# Patient Record
Sex: Male | Born: 1935 | Race: White | Hispanic: No | Smoking: Former smoker
Health system: Southern US, Community
[De-identification: ages and names within clinical notes are randomized; demographics above are authoritative.]

## PROBLEM LIST (undated history)

## (undated) ENCOUNTER — Emergency Department (HOSPITAL_COMMUNITY): Admission: EM | Payer: Medicare Other | Source: Home / Self Care

## (undated) DIAGNOSIS — C61 Malignant neoplasm of prostate: Secondary | ICD-10-CM

## (undated) DIAGNOSIS — L719 Rosacea, unspecified: Secondary | ICD-10-CM

## (undated) DIAGNOSIS — E039 Hypothyroidism, unspecified: Secondary | ICD-10-CM

## (undated) DIAGNOSIS — K219 Gastro-esophageal reflux disease without esophagitis: Secondary | ICD-10-CM

## (undated) DIAGNOSIS — N3941 Urge incontinence: Secondary | ICD-10-CM

## (undated) DIAGNOSIS — I2510 Atherosclerotic heart disease of native coronary artery without angina pectoris: Secondary | ICD-10-CM

## (undated) DIAGNOSIS — E782 Mixed hyperlipidemia: Secondary | ICD-10-CM

## (undated) DIAGNOSIS — I219 Acute myocardial infarction, unspecified: Secondary | ICD-10-CM

## (undated) DIAGNOSIS — Z860100 Personal history of colon polyps, unspecified: Secondary | ICD-10-CM

## (undated) DIAGNOSIS — M1990 Unspecified osteoarthritis, unspecified site: Secondary | ICD-10-CM

## (undated) HISTORY — DX: Gastro-esophageal reflux disease without esophagitis: K21.9

## (undated) HISTORY — DX: Hypothyroidism, unspecified: E03.9

## (undated) HISTORY — DX: Urge incontinence: N39.41

## (undated) HISTORY — DX: Personal history of colon polyps, unspecified: Z86.0100

## (undated) HISTORY — DX: Personal history of colonic polyps: Z86.010

## (undated) HISTORY — DX: Unspecified osteoarthritis, unspecified site: M19.90

## (undated) HISTORY — DX: Acute myocardial infarction, unspecified: I21.9

## (undated) HISTORY — DX: Rosacea, unspecified: L71.9

## (undated) HISTORY — PX: OTHER SURGICAL HISTORY: SHX169

## (undated) HISTORY — PX: TONSILLECTOMY: SUR1361

## (undated) HISTORY — DX: Atherosclerotic heart disease of native coronary artery without angina pectoris: I25.10

## (undated) HISTORY — DX: Mixed hyperlipidemia: E78.2

---

## 2005-12-30 ENCOUNTER — Ambulatory Visit: Payer: Self-pay | Admitting: Internal Medicine

## 2006-03-17 ENCOUNTER — Ambulatory Visit: Payer: Self-pay | Admitting: Cardiology

## 2007-04-07 ENCOUNTER — Encounter: Admission: RE | Admit: 2007-04-07 | Discharge: 2007-04-07 | Payer: Self-pay | Admitting: Neurosurgery

## 2008-05-27 ENCOUNTER — Encounter: Payer: Self-pay | Admitting: Cardiology

## 2010-03-27 ENCOUNTER — Encounter: Payer: Self-pay | Admitting: Cardiology

## 2010-04-08 ENCOUNTER — Encounter: Payer: Self-pay | Admitting: Cardiology

## 2010-09-12 ENCOUNTER — Encounter: Payer: Self-pay | Admitting: Cardiology

## 2010-09-25 ENCOUNTER — Encounter: Payer: Self-pay | Admitting: Cardiology

## 2010-10-19 ENCOUNTER — Encounter: Payer: Self-pay | Admitting: Cardiology

## 2010-10-21 ENCOUNTER — Encounter: Payer: Self-pay | Admitting: Cardiology

## 2010-10-30 ENCOUNTER — Encounter: Payer: Self-pay | Admitting: Cardiology

## 2010-11-11 ENCOUNTER — Encounter: Payer: Self-pay | Admitting: Cardiology

## 2010-11-24 ENCOUNTER — Encounter: Payer: Self-pay | Admitting: Cardiology

## 2010-11-26 ENCOUNTER — Ambulatory Visit: Payer: Self-pay | Admitting: Cardiology

## 2010-11-26 DIAGNOSIS — I200 Unstable angina: Secondary | ICD-10-CM

## 2010-11-26 DIAGNOSIS — R030 Elevated blood-pressure reading, without diagnosis of hypertension: Secondary | ICD-10-CM | POA: Insufficient documentation

## 2010-11-26 DIAGNOSIS — E782 Mixed hyperlipidemia: Secondary | ICD-10-CM | POA: Insufficient documentation

## 2010-11-26 DIAGNOSIS — R9430 Abnormal result of cardiovascular function study, unspecified: Secondary | ICD-10-CM | POA: Insufficient documentation

## 2010-11-26 HISTORY — PX: CORONARY ARTERY BYPASS GRAFT: SHX141

## 2010-11-27 ENCOUNTER — Encounter: Payer: Self-pay | Admitting: Cardiology

## 2010-11-30 ENCOUNTER — Telehealth (INDEPENDENT_AMBULATORY_CARE_PROVIDER_SITE_OTHER): Payer: Self-pay | Admitting: *Deleted

## 2010-12-04 ENCOUNTER — Inpatient Hospital Stay (HOSPITAL_COMMUNITY)
Admission: RE | Admit: 2010-12-04 | Discharge: 2010-12-11 | Payer: Self-pay | Source: Home / Self Care | Attending: Cardiothoracic Surgery | Admitting: Cardiothoracic Surgery

## 2010-12-04 ENCOUNTER — Encounter: Payer: Self-pay | Admitting: Cardiovascular Disease

## 2010-12-04 ENCOUNTER — Encounter: Payer: Self-pay | Admitting: Cardiology

## 2010-12-05 ENCOUNTER — Encounter: Payer: Self-pay | Admitting: Cardiology

## 2010-12-05 ENCOUNTER — Encounter: Payer: Self-pay | Admitting: Cardiothoracic Surgery

## 2010-12-07 ENCOUNTER — Encounter: Payer: Self-pay | Admitting: Cardiology

## 2010-12-08 ENCOUNTER — Encounter: Payer: Self-pay | Admitting: Cardiology

## 2010-12-11 ENCOUNTER — Encounter: Payer: Self-pay | Admitting: Cardiology

## 2010-12-15 ENCOUNTER — Ambulatory Visit: Payer: Self-pay | Admitting: Cardiothoracic Surgery

## 2010-12-31 ENCOUNTER — Encounter: Payer: Self-pay | Admitting: Cardiology

## 2010-12-31 ENCOUNTER — Encounter: Payer: Self-pay | Admitting: Cardiovascular Disease

## 2010-12-31 ENCOUNTER — Encounter
Admission: RE | Admit: 2010-12-31 | Discharge: 2010-12-31 | Payer: Self-pay | Source: Home / Self Care | Attending: Cardiothoracic Surgery | Admitting: Cardiothoracic Surgery

## 2010-12-31 ENCOUNTER — Ambulatory Visit
Admission: RE | Admit: 2010-12-31 | Discharge: 2010-12-31 | Payer: Self-pay | Source: Home / Self Care | Attending: Cardiothoracic Surgery | Admitting: Cardiothoracic Surgery

## 2011-01-01 ENCOUNTER — Encounter: Payer: Self-pay | Admitting: Cardiology

## 2011-01-01 ENCOUNTER — Ambulatory Visit
Admission: RE | Admit: 2011-01-01 | Discharge: 2011-01-01 | Payer: Self-pay | Source: Home / Self Care | Attending: Cardiology | Admitting: Cardiology

## 2011-01-01 DIAGNOSIS — I2510 Atherosclerotic heart disease of native coronary artery without angina pectoris: Secondary | ICD-10-CM | POA: Insufficient documentation

## 2011-01-07 ENCOUNTER — Encounter: Payer: Self-pay | Admitting: Cardiology

## 2011-01-08 ENCOUNTER — Encounter: Payer: Self-pay | Admitting: Cardiology

## 2011-01-11 ENCOUNTER — Telehealth: Payer: Self-pay | Admitting: Cardiology

## 2011-01-15 NOTE — Op Note (Signed)
[name], [name]            ACCOUNT NO.:  [id number]  MEDICAL RECORD NO.:  [id number]          PATIENT TYPE:  INP  LOCATION:  2311                         FACILITY:  [facility]  PHYSICIAN:  Sheliah Plane, MD    DATE OF BIRTH:  03/20/1931  DATE OF PROCEDURE:  12/07/2010 DATE OF DISCHARGE:                              OPERATIVE REPORT   PREOPERATIVE DIAGNOSIS:  Coronary occlusive disease with unstable angina.  POSTOPERATIVE DIAGNOSIS:  Coronary occlusive disease with unstable angina.  SURGICAL PROCEDURE:  Off-pump coronary artery bypass grafting x2 with the left internal mammary to the left anterior descending coronary artery and reverse saphenous vein graft to the distal right coronary artery with leg vein endoharvesting.  SURGEON:  Sheliah Plane, MD.  FIRST ASSISTANT:  Rowe Clack, PA-C.  BRIEF HISTORY:  The patient is a 77 year old male who presented with a complex proximal LAD lesion of greater than 95% and total occlusion of the right coronary artery.  He underwent cardiac catheterization because of positive symptoms and positive stress test.  The patient's ventriculogram was done; however, the patient's nuclear stress test and echo showed preserved LV function, anterior wall ischemia.  He had echocardiogram that demonstrated significant mitral annular calcification, but without evidence of mitral stenosis or significant mitral insufficiency. Coronary artery bypass grafting was recommended. The patient agreed and signed informed consent.  DESCRIPTION OF PROCEDURE:  With Swan-Ganz and arterial line monitor in place, the patient underwent general endotracheal anesthesia without incidence.  Skin of the chest and legs was prepped with Betadine and draped in the usual sterile manner.  Using the Guidant endovein harvesting system, initially vein was harvested from the right thigh. This vein was really of marginal quality use, so additional vein was harvested from the  left thigh; a segment of this was adequate for bypass.  Median sternotomy was performed.  Left internal mammary artery was dissected down as pedicle graft.  The distal artery was divided, had good free flow.  Pericardium was opened and overall ventricular function appeared preserved.  The patient was systemically heparinized.  Both the distal right and the LAD appeared to be suitably situated for off-pump bypass.  The Medtronic stabilization system was used.  Attention was turned first to grafting the distal right coronary artery.  A partial occlusion clamp was placed on the ascending aorta.  Single-punch aortotomy was performed, and a segment of vein harvested from the left leg was anastomosed proximally to the ascending aorta.  The distal air was flushed from the graft.  The graft was trimmed to the appropriate length.  The stabilization suction cup was used to retract the heart. Vascular tapes were placed around the site selected in the distal right coronary artery.  The vessel was opened, it was a fairly small vessel, but did admit a 1.5-mm probe distally.  Using a running 7-0 Prolene, distal anastomosis was performed.  Prior to completion of the anastomosis, air was evacuated from the vessel and graft and anastomosis completed and blood flow restored down the right coronary artery.  The patient remained stable during this.  Attention was then turned to the LAD.  The suction cup and  stabilization device were then properly positioned to give good exposure of the mid left anterior descending coronary artery.  The specimen vessel at this point was of good quality and caliber.  Vessel loops were used to control the vein proximally. The artery was opened using a running 8-0 Prolene.  Left internal mammary artery was anastomosed to left anterior descending coronary artery without difficulty.  Blood flow was restored down the mammary artery.  Tapes and retractors were removed.  The patient  remained hemodynamically stable.  Protamine sulfate was administered.  Fascia of the mammary pedicle was tacked to the epicardium.  Graft marks were applied.  Atrial and ventricular pacing wires were applied.  Left pleural tube and Blake mediastinal drain were left in place. Pericardium was loosely reapproximated.  Sternum closed with #6 stainless steel wire.  Fascia closed with interrupted 3-0 Vicryl, running 3-0 Vicryl, and subcutaneous tissue 4-0 subcuticular stitch and skin edges.  Dry dressings were applied.  Sponge and needle counts were reported as correct at the completion of the procedure.  The patient tolerated the procedure without obvious complication and was transferred to the surgical intensive care unit for further postoperative care.  No blood products were given during the operative procedure.     Sheliah Plane, MD     EG/MEDQ  D:  12/08/2010  T:  12/08/2010  Job:  [id number]  cc:   Verne Carrow, MD  Electronically Signed by Sheliah Plane MD on 01/15/2011 01:51:38 PM

## 2011-01-15 NOTE — Discharge Summary (Signed)
[name], [name]            ACCOUNT NO.:  [id number]  MEDICAL RECORD NO.:  [id number]          PATIENT TYPE:  INP  LOCATION:  2311                         FACILITY:  [facility]  PHYSICIAN:  Sheliah Plane, MD    DATE OF BIRTH:  Nov 22, 1931  DATE OF ADMISSION:  12/04/2010 DATE OF DISCHARGE:  12/11/2010                              DISCHARGE SUMMARY   ADMITTING DIAGNOSES: 1. History of myocardial infarction in 1980. 2. History of coronary artery disease. 3. History of hypertension. 4. History remote tobacco abuse (quit in 1980). 5. History of hypothyroidism. 6. History of arthritis. 7. History of occasional incontinence.  DISCHARGE DIAGNOSES: 1. History of myocardial infarction in 1980. 2. History of coronary artery disease. 3. History of hypertension. 4. History remote tobacco abuse (quit in 1980). 5. History of hypothyroidism. 6. History of arthritis. 7. History of occasional incontinence. 8. Acute blood loss anemia.  PROCEDURES: 1. A 2-D echocardiogram done on December 04, 2010. 2. Coronary bypass grafting surgery x2, off-pump (LIMA to LAD, SVG to     distal RCA with EVH from bilateral thighs; by Dr. Tyrone Sage on     December 07, 2010). 3. Intraop TEE by Dr. Janean Sark on December 07, 2010.  HISTORY OF PRESENT ILLNESS:  This is a 77 year old Caucasian male who had an MI in 1980 and has been treated medically, who had been doing fairly well over the last several years without recurrent symptoms. However, in November of this past year, he presented to [facility] with a new onset of exertional-related chest pain.  The chest pain was described as burning in the middle of his chest.  The chest pain did not radiate and was not associated with shortness of breath or diaphoresis.  A CAT scan of the chest was done, and he was ultimately discharged home and instructed to follow up with Dr. Sherryll Burger (his medical doctor).  A nuclear stress test that was done then demonstrated  normal left ventricular function; however, there was evidence of anterior ischemia.  He was then referred to [facility] for further evaluation and treatment.  The patient was admitted to [facility] on December 04, 2010, in order to undergo cardiac catheterization with Dr. Timmothy Euler.  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  He was also found to have 100% proximal occlusion of the right coronary artery, and his ejection fraction was estimated to be 56%.  A cardiothoracic consultation was then obtained with Dr. Tyrone Sage for consideration of coronary bypass grafting surgery.  Preoperative Duplex carotid ultrasound showed no significant internal carotid artery stenosis.  It should also be noted, the patient was also found to have new-onset hematuria at the time of this cardiac catheterization.  As a result, a urological consultation was obtained with Dr. Isabel Caprice.  It was felt that the most likely etiology was related to benign prostatic hypertrophy.  Because the patient then had minimal hematuria, it was felt that the cardiac evaluation took precedence over urological treatment (i.e., cystoscopy at this time).  It was recommended that the patient then follow up as an outpatient after discharge, with Dr. Isabel Caprice.  Potential risks, benefits, and complications of the coronary bypass grafting surgery were explained to the patient, he agreed to proceed and underwent CABG x2 off-pump with Dr. Tyrone Sage on December 07, 2010.  BRIEF HOSPITAL COURSE STAY:  The patient was extubated without difficulty, the evening of surgery.  He remained afebrile and hemodynamically stable.  He was found to have acute blood loss anemia. His H and H went as low as 10.4 and 31.5; however, his last H and H were up to 11.2 and 34.1 respectively.  The patient was  weaned off of his drips.  Swan-Ganz, A-line, Foley, and chest tube; all removed early in his postoperative course.  The patient remained in sinus rhythm, and all his vital signs remained stable.  He was felt surgically stable for discharge from the intensive care unit.  PCT for further convalescence on December 10, 2010.  Currently, on this day, the patient is afebrile, heart rate in the 70s, BP 117/57, and O2 saturation 99% on room air. Preoperative weight 86 kg, today's weight 80.8 kg.  PHYSICAL EXAMINATION:  CARDIOVASCULAR:  Regular rate and rhythm. LUNGS:  Clear. EXTREMITIES:  Trace lower extremity edema.  Sternal and lower extremity wounds are clean, dry, and continuing to heal.  The patient is already tolerating a diet.  Has not had a bowel movement, but is passing flatus.  Provided patient remains afebrile, hemodynamically stable, and pending more evaluation, he will be surgically stable for discharge on December 11, 2010.  Latest laboratory studies reveal H and H 11.2 and 34.1, white count 10,800, and platelet count of 182,000.  This was done on December 10, 2010.  BMET done on December 15, potassium 4.3, sodium 132, BUN and creatinine were 10 and 0.6 respectively.  Last chest x-ray done on December 10, 2010, showed small locules of pneumomediastinum and anterior mediastinum, likely secondary to previous CABG; bibasilar atelectasis, and small bilateral pleural effusions.  DISCHARGE INSTRUCTIONS:  Include the following:  Diet:  Low-sodium, heart-healthy.  Activity:  The patient may walk up steps.  He may shower, is not to lift more than 10 pounds, he is not to drive until after 4 weeks.  He is to continue with his breathing exercise daily.  He is to walk every day and increase his frequency and duration as tolerated.  Wound care, to use soap and water on his wounds.  He is not to use any gels, lotions, powders on his sternal wound.  He is to contact the office if any wound  problems arise.  FOLLOWUP APPOINTMENTS: 1. The patient is made appointment to see Dr. Tyrone Sage on December 31, 2010, at 11:15 a.m., and prior to this office appointment a chest x-     ray will be obtained. 2. The patient needs to contact Dr. Ival Bible office for followup     appointment in 2 weeks. 3. The patient needs to contact Dr. Ellin Goodie office (urologist) for     followup appointment.  DISCHARGE MEDICATIONS:  At the time of this dictation, include the following: 1. Crestor 5 mg p.o. daily. 2. Ultram 50 mg 1 to 2 tablets every 4 to 6 hours as needed for pain. 3. Enteric-coated aspirin 325 p.o. daily. 4. Toprol XL 25 mg p.o. daily. 5. Flexeril 10 mg p.o. daily at bedtime p.r.n. muscle spasm. 6. Loratadine 10 mg p.o. daily. 7. Prilosec 20 mg p.o. daily. 8.  Synthroid 125 mcg p.o. daily. 9. Please note, the patient was not placed on an ACE inhibitor     secondary to normal left ventricular function, and blood pressure     has been well controlled with the beta-blocker.     Doree Fudge, PA   ______________________________ Sheliah Plane, MD    DZ/MEDQ  D:  12/10/2010  T:  12/11/2010  Job:  [id number]  cc:   Valetta Fuller, M.D. Jonelle Sidle, MD Kirstie Peri, MD  Electronically Signed by Doree Fudge PA on 12/30/2010 09:33:20 AM Electronically Signed by Sheliah Plane MD on 01/15/2011 01:51:31 PM

## 2011-01-26 NOTE — Assessment & Plan Note (Signed)
Summary: NP-POSTIVE STRESS PER DR. [facility] REQUEST   Visit Type:  Initial Consult Primary Provider:  Dr. Kirstie Peri   History of Present Illness: 78 year old male referred for cardiology consultation. Record review finds that he was seen in the emergency department at [facility] back on 4 November complaining of gas-like discomfort in his chest, worse with exertion. Blood pressure significantly elevated at that time to a peak of 181/101. He underwent lab testing and a chest CT, outlined below, and went home. Subsequent followup with Dr. Sherryll Burger resulted in a Myoview done in his office that is significantly abnormal, detailed below.  Lab work from 4 November showed glucose 97, BUN 15, creatinine 0.6, AST 22, ALT 19, sodium 134, potassium 4.1, CK 102, troponin I 0.01, INR 0.9, d-dimer 1.2, hemoglobin 13.6, platelets 245.   Faxed copy of ECG from 4 November shows sinus rhythm at 89 beats per minutes with leftward axis, left atrial enlargement, QTC 477 ms, nonspecific T wave changes.  Patient tells me that he has been experiencing progressive exertional shortness of breath and intermittent chest pressure over the last 4-6 weeks in particular. He exercises regularly through the cardiac rehabilitation program, 5 days a week, typically doing stationary bicycle for 25 minutes at a time. He has also noted that his blood pressure has been trending up over this time.  He reports a remote history of "infarct" back in 05/09/1979, that was managed conservatively with medical therapy. He has never undergone a cardiac catheterization and has no history of revascularization.  Preventive Screening-Counseling & Management  Alcohol-Tobacco     Smoking Status: quit     Year Quit: 1930  Current Medications (verified): 1)  Prilosec 20 Mg Cpdr (Omeprazole) .... Take 1 Tablet By Mouth Two Times A Day 2)  Metoprolol Succinate 25 Mg Xr24h-Tab (Metoprolol Succinate) .... Take 1/2 Tablet By Mouth Once A Day 3)  Aspirin  325 Mg Tabs (Aspirin) .... Take 1 Tablet By Mouth Once A Day 4)  Synthroid 125 Mcg Tabs (Levothyroxine Sodium) .... Take 1 Tablet By Mouth Once A Day 5)  Nitrostat 0.4 Mg Subl (Nitroglycerin) .Marland Kitchen.. 1 Tab Sublingual As Needed Chest Pain 6)  Loratadine 10 Mg Tabs (Loratadine) .... Take 1 Tablet By Mouth Once A Day As Needed 7)  Flonase 50 Mcg/act Susp (Fluticasone Propionate) .... 2 Sprays Per Nostrils Daily  Allergies (verified): 1)  ! * Contrast Dye  Comments:  Nurse/Medical Assistant: The patient's medication bottles and allergies were reviewed with the patient and were updated in the Medication and Allergy Lists.  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  Alcohol Use - yes Daughter lives in [place] in [place]:  [place]  Review of Systems       The patient complains of chest pain and dyspnea on exertion.  The patient denies anorexia, fever, weight loss, syncope, peripheral edema, prolonged cough, headaches, abdominal pain, melena, and hematochezia.         Chronic arthritic foot pain bilaterally. No palpitations or syncope. No orthopnea or PND.  Otherwise reviewed and negative except as outlined.  Vital Signs:  Patient profile:   77 year old male Height:      66 inches Weight:      182 pounds BMI:     29.48 Pulse rate:   64 / minute BP sitting:   160 / 90  (left arm) Cuff size:   regular  Vitals Entered By: Carlye Grippe (November 26, 2010 9:26 AM)  Nutrition Counseling: Patient's BMI is greater than 25 and therefore counseled on weight management options.  Physical Exam  Additional Exam:  Overweight elderly male in no acute distress, without active symptoms. HEENT: Conjunctiva and lids normal, oropharynx with moist mucosa. Neck: Supple, no elevated JVP or carotid bruits, no thyromegaly. Lungs: Decreased breath sounds, clear however, no labored breathing or wheezing. Cardiac: Regular rate and rhythm, soft basal systolic murmur, preserved second heart sound, no S3 gallop. Abdomen: Soft, nontender, bowel sounds present, no bruits. Extremities: No pitting edema, distal pulses one plus. Musculoskeletal: Mild kyphosis noted. Skin: Warm and dry. Neuropsychiatric: Alert and oriented x3, affect appropriate.   Nuclear Study  Procedure date:  11/11/2010  Findings:      [facility]:  Exercise Myoview. Patient achieved 4.6 METS, 90% maximum predicted heart rate response. Peak blood pressure 145/85. Patient reported chest pain during exercise. Equivocal ST segment changes described in the lateral leads. Perfusion imaging demonstrates increased t.i.d. ratio of 1.34, moderate intensity mid anterior, mid anteroseptal, apical anterior, apical septal defects that are reversible and consistent with ischemia. LVEF 56%. Hypokinesis of the apical anterior and apical septal walls.   CT Scan  Procedure date:  10/30/2010  Findings:      Chest CT angiogram. No evidence of acute coronary embolus. Cardiomegaly and atherosclerosis noted. No other acute findings.  Impression & Recommendations:  Problem # 1:  CARDIOVASCULAR  FUNCTION STUDY, ABNORMAL (ICD-794.30)  Abnormal exercise Myoview done through [facility] recently, consistent with underlying ischemic heart disease, notably in the left anterior descending distribution, however possibly multivessel based on increased t.i.d. ratio. LVEF normal. This is noted in the setting of reported remote myocardial infarction managed conservatively in the 1980s, and progressive dyspnea on exertion as well as anginal chest discomfort over the last 4-6 weeks. Patient has been active, exercising 5 days a week through the local cardiac rehabilitation program. Medical therapy is fairly limited at this point. We discussed the implications of his stress testing, relatively high risk features in general, and the risk-benefit profile of proceeding on to a diagnostic cardiac catheterization to best define his coronary anatomy and assess for revascularization options. He is in agreement to proceed, and would like to have this scheduled for next week when his son can be in town. We will repeat baseline labs. He reports a contrast dye allergy, does not appear to be anaphylactic based on his description. He will be pretreated with steroids, H2 blockers, and Benadryl.  Orders: Cardiac Catheterization (Cardiac Cath)  Problem # 2:  ANGINA, UNSTABLE (ICD-411.1)  Exertional shortness of breath and chest discomfort consistent with angina, unstable in that it has been progressive over the last 4-6 weeks. He is not experiencing any rest symptoms however. He does have nitroglycerin at home. It was discussed that he should seek urgent medical  attention if symptoms suddenly escalate.  His updated medication list for this problem includes:    Metoprolol Succinate 25 Mg Xr24h-tab (Metoprolol succinate) .Marland Kitchen... Take 1/2 tablet by mouth once a day    Aspirin 325 Mg Tabs (Aspirin) .Marland Kitchen... Take 1 tablet by mouth once a day    Nitrostat 0.4 Mg Subl (Nitroglycerin) .Marland Kitchen... 1 tab sublingual as needed chest  pain  Orders: Cardiac Catheterization (Cardiac Cath)  Problem # 3:  ELEVATED BLOOD PRESSURE (ICD-796.2)  Blood pressure trend has been increasing recently, perhaps related to progressive ischemic heart disease. He may require further medical therapy however going forward.  Problem # 4:  MIXED HYPERLIPIDEMIA (ICD-272.2)  Noted based on records. Patient denies that he has required any medical therapy over time. He will eventually need a followup fasting lipid profile to better define his LDL control with an eye toward statin therapy going forward.  Other Orders: T-Basic Metabolic Panel [id number]) T-CBC No Diff ([id number]) T-PTT ([id number]) T-Protime, Auto ([id number])  Patient Instructions: 1)  Your physician has requested that you have a cardiac catheterization.  Cardiac catheterization is used to diagnose and/or treat various heart conditions. Doctors may recommend this procedure for a number of different reasons. The most common reason is to evaluate chest pain. Chest pain can be a symptom of coronary artery disease (CAD), and cardiac catheterization can show whether plaque is narrowing or blocking your heart's arteries. This procedure is also used to evaluate the valves, as well as measure the blood flow and oxygen levels in different parts of your heart.  For further information please visit [email or web address]  Please follow instruction sheet, as given. 2)  Your physician recommends that you go to the [facility] for lab work: DO TOMORROW, FRIDAY, Nov 27, 2010 Prescriptions: PREDNISONE 20 MG TABS (PREDNISONE) Take 3 tablets by mouth the night before cath and 3 tablets by mouth the morning of cath as directed  #6 x 0   Entered by:   Cyril Loosen, RN, BSN   Authorized by:   Loreli Slot, MD, [facility]   Signed by:   Cyril Loosen, RN, BSN on 11/26/2010   Method used:   Electronically to        [facility] [address] (retail)       [address]       [place], [place]  [address]       Ph: [phone] or [phone]       Fax: [phone]   RxID:   [id number]

## 2011-01-26 NOTE — Letter (Signed)
Summary: External Correspondence/ OFFICE VISIT [facility]  External Correspondence/ OFFICE VISIT [facility]   Imported By: Dorise Hiss 11/24/2010 14:00:43  _____________________________________________________________________  External Attachment:    Type:   Image     Comment:   External Document

## 2011-01-26 NOTE — Letter (Signed)
Summary: External Correspondence/ OFFICE VISIT [facility]  External Correspondence/ [facility]ICE [facility]ISIT [facility]   Imported By: Dorise Hiss 11/24/2010 14:02:22  _____________________________________________________________________  External Attachment:    Type:   Image     Comment:   External Document

## 2011-01-26 NOTE — Letter (Signed)
Summary: External Correspondence/ OFFICE VISIT [facility]  External Correspondence/ [facility]ICE [facility]IT [facility]   Imported By: Dorise Hiss 11/24/2010 13:59:18  _____________________________________________________________________  External Attachment:    Type:   Image     Comment:   External Document

## 2011-01-26 NOTE — Medication Information (Signed)
Summary: RX Folder/ [facility]  RX Folder/ [facility]   Imported By: Dorise Hiss 11/24/2010 14:39:04  _____________________________________________________________________  External Attachment:    Type:   Image     Comment:   External Document

## 2011-01-26 NOTE — Progress Notes (Signed)
Summary: Blood in Urine  Phone Note Call from Patient Call back at [facility] Phone [phone]   Summary of Call: Pt called the office this morning. He states he noticed blood in his urine yesterday. He states this is since increasing Aspirin to 325mg  once daily. He states he d/w Dr. Sherryll Burger who recommended he stop Aspirin for now. If blood resolves, then pt will decrease Aspirin to 81mg  once daily.  Initial call taken by: Cyril Loosen, RN, BSN,  November 30, 2010 10:55 AM

## 2011-01-28 NOTE — Assessment & Plan Note (Signed)
Summary: EPH-POST [facility] 12/12 2 BY-PASS   Visit Type:  Follow-up Primary Provider:  Dr. Kirstie Peri   History of Present Illness: 77 year old male presents for post hospital followup. I saw him back in December in consultation for a significantly abnormal Myoview. He was referred for a diagnostic cardiac catheterization, outlined below, revealing two vessel CAD, ultimately undergoing off-pump bypass grafting with a LIMA to the LAD and SVG to the distal RCA on 12 December by Dr. Tyrone Sage.  Labs at discharge revealed hemoglobin 11.2, platelets 182, potassium 4.3, BUN 10, creatinine 0.6, cholesterol 255, triglycerides 86, HDL 45, LDL 193. He had prior intolerances to statin medications including Lipitor and Zetia, but seems to be tolerating low-dose Crestor so far.  He states he saw Dr. Tyrone Sage yesterday, and has been cleared to return to driving as well as beginning cardiac rehabilitation. He reports improving postsurgical chest soreness. Otherwise he feels "good."  Reviewed his medications.  Preventive Screening-Counseling & Management  Alcohol-Tobacco     Smoking Status: quit     Year Quit: 1930  Current Medications (verified): 1)  Prilosec 20 Mg Cpdr (Omeprazole) .... Take 1 Tablet By Mouth Two Times A Day 2)  Metoprolol Succinate 25 Mg Xr24h-Tab (Metoprolol Succinate) .... Take 1/2 Tablet By Mouth Once A Day 3)  Aspirin 325 Mg Tabs (Aspirin) .... Take 1 Tablet By Mouth Once A Day 4)  Synthroid 125 Mcg Tabs (Levothyroxine Sodium) .... Take 1 Tablet By Mouth Once A Day 5)  Nitrostat 0.4 Mg Subl (Nitroglycerin) .Marland Kitchen.. 1 Tab Sublingual As Needed Chest Pain 6)  Loratadine 10 Mg Tabs (Loratadine) .... Take 1 Tablet By Mouth Once A Day As Needed 7)  Nasonex 50 Mcg/act Susp (Mometasone Furoate) .... Use As Directed 8)  Crestor 5 Mg Tabs (Rosuvastatin Calcium) .... Take 1 Tablet By Mouth Once A Day 9)  Cyclobenzaprine Hcl 10 Mg Tabs (Cyclobenzaprine Hcl) .... Take 1 Tablet By Mouth Once A  Day 10)  Tramadol Hcl 50 Mg Tabs (Tramadol Hcl) .... Take 1-2 By Mouth Every 4-6 Hours As Needed Pain 11)  Stool Softener 100 Mg Caps (Docusate Sodium) .... Take 1 Tablet By Mouth Once A Day  Allergies (verified): 1)  ! * Contrast Dye  Comments:  Nurse/Medical Assistant: The patient's medication bottles and allergies were reviewed with the patient and were updated in the Medication and Allergy Lists.  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  Alcohol Use - yes Daughter lives in [place] Son lives in [place]  Past Medical History: Myocardial Infarction - 1979-05-24, managed conservatively Hypothyroidism G E R D Herpes zoster  Colonic polyps - reportedly benign CAD - 2 vessel, LVEF 60% Hyperlipidemia Arthritis Rosacea History of urge incontinence Allergic rhinitis Seborrheic dermatitis  Past Surgical History: CABG - off pump LIMA to LAD, SVG to RCA, Dr. Tyrone Sage 12/11 Tonsillectomy Submandibular salivary gland resection 05/23/2005  Review of Systems  The patient denies anorexia, fever, weight loss, syncope, dyspnea on exertion, peripheral edema, prolonged cough, hemoptysis, abdominal pain, melena, and hematochezia.         Otherwise reviewed and negative.  Vital Signs:  Patient profile:   77 year old male Height:      66 inches Weight:      182 pounds Pulse rate:   83 / minute BP sitting:   125 / 85  (left arm) Cuff size:   regular  Vitals  Entered By: Carlye Grippe (January 01, 2011 3:13 PM)  Physical Exam  Additional Exam:  Overweight elderly male in no acute distress, without active symptoms. HEENT: Conjunctiva and lids normal, oropharynx with moist mucosa. Neck: Supple, no elevated JVP or carotid bruits, no thyromegaly. Lungs: Decreased breath sounds, clear however, no labored breathing or wheezing. Cardiac: Regular rate and rhythm, no S3  gallop or rub. Chest: Well healing midline sternal incision. Abdomen: Soft, nontender, bowel sounds present, no bruits. Extremities: No pitting edema, distal pulses one plus. Musculoskeletal: Mild kyphosis noted. Skin: Warm and dry. Neuropsychiatric: Alert and oriented x3, affect appropriate.   Cardiac Cath  Procedure date:  12/05/2010  Findings:      ANGIOGRAPHIC FINDINGS:   1. The left main coronary artery had no obstructive disease.   2. The left anterior descending was a large vessel that coursed to the       apex and gave off several small-caliber diagonal branches.  The       diagonal branches were free of any significant disease.  The left       anterior descending artery had a 95% discrete stenosis in the       proximal portion of the vessel with aneurysmal dilatation in the       proximal and mid vessel, both before and after the severe stenosis.       The distal LAD had mild luminal irregularities and was a moderate-       sized vessel, and it wrapped around the apex.   3. The circumflex artery gave off an early large obtuse marginal       branch that had minor plaque disease.  The mid circumflex artery       had 30% stenosis just beyond the takeoff of the obtuse marginal       branch.  The second obtuse marginal branch had mild plaque disease.   4. The right coronary artery has a 100% proximal occlusion.  The       distal right coronary artery fills in faintly from left-to-right       collaterals.   5. No left ventricular angiogram was performed secondary to elevated       end-diastolic pressure.  Echocardiogram  Procedure date:  12/04/2010  Findings:       Study Conclusions    - Left ventricle: The cavity size was normal. Systolic function was     normal. The estimated ejection fraction was in the range of 55% to     60%. Doppler parameters are consistent with abnormal left     ventricular relaxation (grade 1 diastolic dysfunction).   - Aortic valve: Trivial  regurgitation.   - Mitral valve: Calcified annulus. Mildly thickened leaflets   Carotid Doppler  Procedure date:  12/05/2010  Findings:      Summary:   Bilateral: mild mixed plaque origin ICA. No ICA stenosis. Vertebral   artery flow is antegrade.  EKG  Procedure date:  01/01/2011  Findings:      Sinus rhythm at 80 beats per minute with single PVC, leftward axis, nonspecific T wave changes.  Impression & Recommendations:  Problem # 1:  CORONARY ATHEROSCLEROSIS NATIVE CORONARY ARTERY (ICD-414.01)  Two-vessel obstructive disease now status post off pump bypass grafting with LIMA to LAD and SVG to RCA, tolerated well postoperatively. LVEF is normal. The patient saw Dr. Tyrone Sage recently, now cleared to return to driving. Still to avoid heavy lifting. He plans to resume cardiac rehabilitation soon. Otherwise  I will see him back in the next 6 weeks to check on his progress. No medication changes made today.  His updated medication list for this problem includes:    Metoprolol Succinate 25 Mg Xr24h-tab (Metoprolol succinate) .Marland Kitchen... Take 1/2 tablet by mouth once a day    Aspirin 325 Mg Tabs (Aspirin) .Marland Kitchen... Take 1 tablet by mouth once a day    Nitrostat 0.4 Mg Subl (Nitroglycerin) .Marland Kitchen... 1 tab sublingual as needed chest pain  Problem # 2:  MIXED HYPERLIPIDEMIA (ICD-272.2)  Tolerating Crestor. Plan to followup fasting lipid profile and liver function tests were his next visit.  His updated medication list for this problem includes:    Crestor 5 Mg Tabs (Rosuvastatin calcium) .Marland Kitchen... Take 1 tablet by mouth once a day  Future Orders: T-Lipid Profile ([id number]) ... 02/01/2011 T-Hepatic Function [phone]) ... 02/01/2011  Other Orders: EKG w/ Interpretation ([address])  Patient Instructions: 1)  Labs:  FLP, LFT - do just before next office visit 2)  Follow up in  6 weeks

## 2011-01-28 NOTE — Op Note (Signed)
Summary: Operative Report/ Sheliah Plane MD  Operative Report/ Sheliah Plane MD   Imported By: Dorise Hiss 01/01/2011 08:31:55  _____________________________________________________________________  External Attachment:    Type:   Image     Comment:   External Document

## 2011-01-28 NOTE — Letter (Signed)
Summary: Internal Correspondence/ FAXED OUTPATIENT CATH  Internal Correspondence/ FAXED OUTPATIENT CATH   Imported By: Dorise Hiss 12/09/2010 11:49:26  _____________________________________________________________________  External Attachment:    Type:   Image     Comment:   External Document

## 2011-01-28 NOTE — Op Note (Signed)
Summary: Operative Report/ DAVID JOSLIN MD  Operative Report/ DAVID JOSLIN MD   Imported By: Dorise Hiss 01/01/2011 08:37:20  _____________________________________________________________________  External Attachment:    Type:   Image     Comment:   External Document

## 2011-01-28 NOTE — Progress Notes (Signed)
Summary: Bright red rectal bleeding following increase in Aspirin.  Phone Note Call from Patient Call back at [facility] Phone [phone]   Summary of Call: Pt called the office stating his Aspirin was recently increased from 325mg  to 81mg . He states today he had a hard bowel movement. He went back to the bathroom later and only had gas, however he did have "fresh" blood at this time. He states this blood was bright red and has since stopped. He has no bleeding now. Pt notified he may have had hemorrhoid that was causing the bleeding as blood was bright red and has now stopped. Pt wanted to know if he should take 325mg  or go back to 81mg  of Aspirin. He states he changed back to 81 on his own but will go back to the 325mg  tomorrow and monitor for further bleeding. Pt aware message will be sent to Dr. Diona Browner and pt will be notified if he has any further recommendations.  Initial call taken by: Cyril Loosen, RN, BSN,  January 11, 2011 4:06 PM  Follow-up for Phone Call        Reviewed. Did not see any discussion about adjustment of aspirin dose in most recent note. It would be fine for him to cut back to 81 mg daily. He should also let his primary care physician know about the hematochezia as further workup may be needed. Follow-up by: Loreli Slot, MD, [facility],  January 11, 2011 5:04 PM     Appended Document: Bright red rectal bleeding following increase in Aspirin. Left message to call back on voicemail.  Appended Document: Bright red rectal bleeding following increase in Aspirin. Pt notified and verbalized understanding.

## 2011-01-28 NOTE — Miscellaneous (Signed)
Summary: Rehab Report/ FAXED CARDIAC REHAB REFERRAL  Rehab Report/ FAXED CARDIAC REHAB REFERRAL   Imported By: Dorise Hiss 01/01/2011 09:17:09  _____________________________________________________________________  External Attachment:    Type:   Image     Comment:   External Document

## 2011-01-28 NOTE — Miscellaneous (Signed)
Summary: Rehab Report/ CARDIAC REHAB PROGRESS REPORT  Rehab Report/ CARDIAC REHAB PROGRESS REPORT   Imported By: Dorise Hiss 01/07/2011 15:49:58  _____________________________________________________________________  External Attachment:    Type:   Image     Comment:   External Document

## 2011-02-08 ENCOUNTER — Encounter: Payer: Self-pay | Admitting: Cardiology

## 2011-02-11 NOTE — Progress Notes (Signed)
Summary: [facility]: Office Visit  [facility]: Office Visit   Imported By: Earl Many 02/02/2011 16:04:49  _____________________________________________________________________  External Attachment:    Type:   Image     Comment:   External Document

## 2011-02-16 ENCOUNTER — Encounter: Payer: Self-pay | Admitting: Cardiology

## 2011-02-17 NOTE — Miscellaneous (Signed)
Summary: Rehab Report/ CARDIAC REHAB PROGRESS REPORT  Rehab Report/ CARDIAC REHAB PROGRESS REPORT   Imported By: Dorise Hiss 02/09/2011 09:14:58  _____________________________________________________________________  External Attachment:    Type:   Image     Comment:   External Document

## 2011-02-17 NOTE — Consult Note (Signed)
Summary: [facility]: New Pt Consultation  [facility]: New Pt Consultation   Imported By: Earl Many 02/08/2011 11:01:42  _____________________________________________________________________  External Attachment:    Type:   Image     Comment:   External Document

## 2011-02-18 ENCOUNTER — Encounter: Payer: Self-pay | Admitting: Cardiology

## 2011-02-18 ENCOUNTER — Ambulatory Visit (INDEPENDENT_AMBULATORY_CARE_PROVIDER_SITE_OTHER): Payer: Medicare Other | Admitting: Cardiology

## 2011-02-18 DIAGNOSIS — R079 Chest pain, unspecified: Secondary | ICD-10-CM

## 2011-02-18 DIAGNOSIS — E782 Mixed hyperlipidemia: Secondary | ICD-10-CM

## 2011-02-18 DIAGNOSIS — I2510 Atherosclerotic heart disease of native coronary artery without angina pectoris: Secondary | ICD-10-CM

## 2011-02-23 NOTE — Assessment & Plan Note (Signed)
Summary: 6 WK F/U PER LAST OV/ J[facility]   Visit Type:  Follow-up Primary Provider:  Dr. Kirstie Peri   History of Present Illness: 77 year old male presents for followup. He was seen in January.  He continues to exercise at cardiac rehabilitation. Generally he reports doing well, although occasionally has a vague, mild intensity, left axillary discomfort. This is not predictable. He also has some discomfort on the left side of his chest related to wearing a seatbelt. Sounds mainly musculoskeletal or neuropathic following sternotomy.  Follow up labs from 21 February showed AST 19, ALT 15, triglycerides 129, cholesterol 191, HDL 50, LDL 115. He states he has been taking his Crestor, although is concerned that he is having some memory problems and also general muscle weakness. He states he had similar problems on statins in the past, although did seem to be able to take Lipitor at 10 mg daily at one point.  Preventive Screening-Counseling & Management  Alcohol-Tobacco     Smoking Status: quit     Year Quit: 1930  Current Medications (verified): 1)  Prilosec 20 Mg Cpdr (Omeprazole) .... Take 1 Tablet By Mouth Two Times A Day 2)  Metoprolol Succinate 25 Mg Xr24h-Tab (Metoprolol Succinate) .... Take 1 Tablet By Mouth Once A Day 3)  Aspir-Low 81 Mg Tbec (Aspirin) .... Take 1 Tablet By Mouth Once A Day 4)  Synthroid 125 Mcg Tabs (Levothyroxine Sodium) .... Take 1 Tablet By Mouth Once A Day 5)  Nitrostat 0.4 Mg Subl (Nitroglycerin) .Marland Kitchen.. 1 Tab Sublingual As Needed Chest Pain 6)  Loratadine 10 Mg Tabs (Loratadine) .... Take 1 Tablet By Mouth Once A Day As Needed 7)  Nasonex 50 Mcg/act Susp (Mometasone Furoate) .... Use As Directed 8)  Crestor 5 Mg Tabs (Rosuvastatin Calcium) .... Take 1 Tablet By Mouth Once A Day 9)  Cyclobenzaprine Hcl 10 Mg Tabs (Cyclobenzaprine Hcl) .... Take 1 Tablet By Mouth Once A Day 10)  Tramadol Hcl 50 Mg Tabs (Tramadol Hcl) .... Take 1-2 By Mouth Every 4-6 Hours As Needed  Pain  Allergies (verified): 1)  ! * Contrast Dye  Comments:  Nurse/Medical Assistant: The patient's medication bottles and allergies were reviewed with the patient and were updated in the Medication and Allergy Lists.  Past History:  Past Medical History: Last updated: 01/01/2011 Myocardial Infarction - 05-25-79, managed conservatively Hypothyroidism G E R D Herpes zoster  Colonic polyps - reportedly benign CAD - 2 vessel, LVEF 60% Hyperlipidemia Arthritis Rosacea History of urge incontinence Allergic rhinitis Seborrheic dermatitis  Past Surgical History: Last updated: 01/01/2011 CABG - off pump LIMA to LAD, SVG to RCA, Dr. 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  Alcohol Use - yes Daughter lives in [place] Son lives in [place]  Review of Systems  The patient denies anorexia, fever, weight loss, syncope, dyspnea on exertion, peripheral edema, prolonged cough, headaches, melena, and hematochezia.         Otherwise reviewed and negative except as outlined above.  Vital Signs:  Patient profile:   77 year old male Height:      66 inches Weight:      184 pounds Pulse rate:   74 / minute BP sitting:   138 / 88  (left arm) Cuff size:   regular  Vitals Entered By: Carlye Grippe (February 18, 2011 1:23 PM)  Physical Exam  Additional Exam:  Overweight elderly male in no acute distress, without active symptoms. HEENT: Conjunctiva and lids normal, oropharynx with moist mucosa. Neck: Supple, no elevated JVP or carotid bruits, no thyromegaly. Lungs: Decreased breath sounds, clear however, no labored breathing or wheezing. Cardiac: Regular rate and rhythm, no S3 gallop or rub. Chest: Well healing midline sternal incision. Abdomen: Soft, nontender, bowel sounds present, no  bruits. Extremities: No pitting edema, distal pulses one plus. Musculoskeletal: Mild kyphosis noted. Skin: Warm and dry. Neuropsychiatric: Alert and oriented x3, affect appropriate.   EKG  Procedure date:  02/18/2011  Findings:      Normal sinus rhythm at 73 beats per minute with leftward axis and nonspecific T wave changes.  Impression & Recommendations:  Problem # 1:  CORONARY ATHEROSCLEROSIS NATIVE CORONARY ARTERY (ICD-414.01)  Continue medical therapy. ECG is unchanged. Check blood pressure in the morning after taking medications. Continue with cardiac rehabilitation. Followup assessment in 2 months.  His updated medication list for this problem includes:    Metoprolol Succinate 25 Mg Xr24h-tab (Metoprolol succinate) .Marland Kitchen... Take 1 tablet by mouth once a day    Aspir-low 81 Mg Tbec (Aspirin) .Marland Kitchen... Take 1 tablet by mouth once a day    Nitrostat 0.4 Mg Subl (Nitroglycerin) .Marland Kitchen... 1 tab sublingual as needed chest pain  Orders: EKG w/ Interpretation (93000)  Problem # 2:  MIXED HYPERLIPIDEMIA (ICD-272.2)  LDL better but not at goal. Patient having concerns about side effects related to Crestor however. I have asked him to stop Crestor and see if the symptoms improve. If so, we might consider using low-dose Lipitor. Will discuss with him at next office visit.  The following medications were removed from the medication list:    Crestor 5 Mg Tabs (Rosuvastatin calcium) .Marland Kitchen... Take 1 tablet by mouth once a day  Problem # 3:  ELEVATED BLOOD PRESSURE (ICD-796.2)  Blood pressure mildly elevated again today. Follow with home checks.  Patient Instructions: 1)  Follow up with Dr. Diona Browner on  Tuesday, April 20, 2011 at 1:20pm. 2)  Stop Crestor. Prescriptions: METOPROLOL SUCCINATE 25 MG XR24H-TAB (METOPROLOL SUCCINATE) Take 1 tablet by mouth once a day  #90 x 3   Entered by:   Cyril Loosen, RN, BSN   Authorized by:   Loreli Slot, MD, [facility]   Signed by:   Cyril Loosen,  RN, BSN on 02/18/2011   Method used:   Electronically to        [facility]* (mail-order)       [address] [place], [place]  [address]       Ph: [phone]       Fax: [phone]   RxID:   [id number]

## 2011-03-03 ENCOUNTER — Encounter: Payer: Self-pay | Admitting: Cardiology

## 2011-03-08 LAB — BASIC METABOLIC PANEL
BUN: 10 mg/dL (ref 6–23)
CO2: 27 mEq/L (ref 19–32)
Calcium: 8.2 mg/dL — ABNORMAL LOW (ref 8.4–10.5)
Chloride: 99 mEq/L (ref 96–112)
Creatinine, Ser: 0.68 mg/dL (ref 0.4–1.5)
GFR calc Af Amer: >60 mL/min (ref >60)
GFR calc non Af Amer: >60 mL/min (ref >60)
Glucose, Bld: 111 mg/dL — ABNORMAL HIGH (ref 70–99)
Potassium: 4.3 mEq/L (ref 3.5–5.1)
Sodium: 132 mEq/L — ABNORMAL LOW (ref 135–145)

## 2011-03-08 LAB — GLUCOSE, CAPILLARY

## 2011-03-08 LAB — CBC
HCT: 34.1 % — ABNORMAL LOW (ref 39.0–52.0)
Hemoglobin: 11.2 g/dL — ABNORMAL LOW (ref 13.0–17.0)
MCH: 29.8 pg (ref 26.0–34.0)
MCHC: 32.8 g/dL (ref 30.0–36.0)
MCV: 90.7 fL (ref 78.0–100.0)
Platelets: 182 10*3/uL (ref 150–400)
RBC: 3.76 MIL/uL — ABNORMAL LOW (ref 4.22–5.81)
RDW: 14.4 % (ref 11.5–15.5)
WBC: 10.8 10*3/uL — ABNORMAL HIGH (ref 4.0–10.5)

## 2011-03-09 ENCOUNTER — Encounter: Payer: Self-pay | Admitting: Cardiology

## 2011-03-09 LAB — CBC
HCT: 31.4 % — ABNORMAL LOW (ref 39.0–52.0)
HCT: 31.5 % — ABNORMAL LOW (ref 39.0–52.0)
HCT: 32.9 % — ABNORMAL LOW (ref 39.0–52.0)
HCT: 33.5 % — ABNORMAL LOW (ref 39.0–52.0)
HCT: 33.8 % — ABNORMAL LOW (ref 39.0–52.0)
HCT: 40.4 % (ref 39.0–52.0)
Hemoglobin: 10.4 g/dL — ABNORMAL LOW (ref 13.0–17.0)
Hemoglobin: 10.5 g/dL — ABNORMAL LOW (ref 13.0–17.0)
Hemoglobin: 11 g/dL — ABNORMAL LOW (ref 13.0–17.0)
Hemoglobin: 11 g/dL — ABNORMAL LOW (ref 13.0–17.0)
Hemoglobin: 11.2 g/dL — ABNORMAL LOW (ref 13.0–17.0)
Hemoglobin: 13.7 g/dL (ref 13.0–17.0)
MCH: 29.7 pg (ref 26.0–34.0)
MCH: 29.7 pg (ref 26.0–34.0)
MCH: 29.8 pg (ref 26.0–34.0)
MCH: 29.9 pg (ref 26.0–34.0)
MCH: 30 pg (ref 26.0–34.0)
MCH: 30.2 pg (ref 26.0–34.0)
MCHC: 32.8 g/dL (ref 30.0–36.0)
MCHC: 33 g/dL (ref 30.0–36.0)
MCHC: 33.1 g/dL (ref 30.0–36.0)
MCHC: 33.4 g/dL (ref 30.0–36.0)
MCHC: 33.4 g/dL (ref 30.0–36.0)
MCHC: 33.9 g/dL (ref 30.0–36.0)
MCV: 88.6 fL (ref 78.0–100.0)
MCV: 89.2 fL (ref 78.0–100.0)
MCV: 89.5 fL (ref 78.0–100.0)
MCV: 89.7 fL (ref 78.0–100.0)
MCV: 90.5 fL (ref 78.0–100.0)
MCV: 91.6 fL (ref 78.0–100.0)
Platelets: 145 10*3/uL — ABNORMAL LOW (ref 150–400)
Platelets: 156 10*3/uL (ref 150–400)
Platelets: 163 10*3/uL (ref 150–400)
Platelets: 178 10*3/uL (ref 150–400)
Platelets: 190 10*3/uL (ref 150–400)
Platelets: 218 10*3/uL (ref 150–400)
RBC: 3.44 MIL/uL — ABNORMAL LOW (ref 4.22–5.81)
RBC: 3.51 MIL/uL — ABNORMAL LOW (ref 4.22–5.81)
RBC: 3.69 MIL/uL — ABNORMAL LOW (ref 4.22–5.81)
RBC: 3.7 MIL/uL — ABNORMAL LOW (ref 4.22–5.81)
RBC: 3.77 MIL/uL — ABNORMAL LOW (ref 4.22–5.81)
RBC: 4.56 MIL/uL (ref 4.22–5.81)
RDW: 14.2 % (ref 11.5–15.5)
RDW: 14.3 % (ref 11.5–15.5)
RDW: 14.4 % (ref 11.5–15.5)
RDW: 14.4 % (ref 11.5–15.5)
RDW: 14.5 % (ref 11.5–15.5)
RDW: 14.9 % (ref 11.5–15.5)
WBC: 10.5 10*3/uL (ref 4.0–10.5)
WBC: 11.1 10*3/uL — ABNORMAL HIGH (ref 4.0–10.5)
WBC: 12.2 10*3/uL — ABNORMAL HIGH (ref 4.0–10.5)
WBC: 12.4 10*3/uL — ABNORMAL HIGH (ref 4.0–10.5)
WBC: 12.9 10*3/uL — ABNORMAL HIGH (ref 4.0–10.5)
WBC: 8.7 10*3/uL (ref 4.0–10.5)

## 2011-03-09 LAB — BLOOD GAS, ARTERIAL
Acid-base deficit: 0.2 mmol/L (ref 0.0–2.0)
Bicarbonate: 22.5 mEq/L (ref 20.0–24.0)
O2 Saturation: 98.6 %
Patient temperature: 98.6
TCO2: 23.3 mmol/L (ref 0–100)
pCO2 arterial: 27.6 mmHg — ABNORMAL LOW (ref 35.0–45.0)
pH, Arterial: 7.521 — ABNORMAL HIGH (ref 7.350–7.450)
pO2, Arterial: 107 mmHg — ABNORMAL HIGH (ref 80.0–100.0)

## 2011-03-09 LAB — DIFFERENTIAL
Basophils Absolute: 0 10*3/uL (ref 0.0–0.1)
Basophils Relative: 0 % (ref 0–1)
Eosinophils Absolute: 0 10*3/uL (ref 0.0–0.7)
Eosinophils Relative: 0 % (ref 0–5)
Lymphocytes Relative: 24 % (ref 12–46)
Lymphs Abs: 2.7 10*3/uL (ref 0.7–4.0)
Monocytes Absolute: 1.1 10*3/uL — ABNORMAL HIGH (ref 0.1–1.0)
Monocytes Relative: 10 % (ref 3–12)
Neutro Abs: 7.3 10*3/uL (ref 1.7–7.7)
Neutrophils Relative %: 65 % (ref 43–77)

## 2011-03-09 LAB — COMPREHENSIVE METABOLIC PANEL
ALT: 17 U/L (ref 0–53)
AST: 21 U/L (ref 0–37)
Albumin: 3.4 g/dL — ABNORMAL LOW (ref 3.5–5.2)
Alkaline Phosphatase: 54 U/L (ref 39–117)
BUN: 20 mg/dL (ref 6–23)
CO2: 24 mEq/L (ref 19–32)
Calcium: 9.1 mg/dL (ref 8.4–10.5)
Chloride: 101 mEq/L (ref 96–112)
Creatinine, Ser: 0.83 mg/dL (ref 0.4–1.5)
GFR calc Af Amer: >60 mL/min (ref >60)
GFR calc non Af Amer: >60 mL/min (ref >60)
Glucose, Bld: 105 mg/dL — ABNORMAL HIGH (ref 70–99)
Potassium: 4 mEq/L (ref 3.5–5.1)
Sodium: 132 mEq/L — ABNORMAL LOW (ref 135–145)
Total Bilirubin: 0.7 mg/dL (ref 0.3–1.2)
Total Protein: 6.2 g/dL (ref 6.0–8.3)

## 2011-03-09 LAB — GLUCOSE, CAPILLARY
Glucose-Capillary: 118 mg/dL — ABNORMAL HIGH (ref 70–99)
Glucose-Capillary: 131 mg/dL — ABNORMAL HIGH (ref 70–99)
Glucose-Capillary: 131 mg/dL — ABNORMAL HIGH (ref 70–99)
Glucose-Capillary: 134 mg/dL — ABNORMAL HIGH (ref 70–99)
Glucose-Capillary: 134 mg/dL — ABNORMAL HIGH (ref 70–99)
Glucose-Capillary: 136 mg/dL — ABNORMAL HIGH (ref 70–99)
Glucose-Capillary: 159 mg/dL — ABNORMAL HIGH (ref 70–99)
Glucose-Capillary: 75 mg/dL (ref 70–99)
Glucose-Capillary: 96 mg/dL (ref 70–99)

## 2011-03-09 LAB — URINALYSIS, MICROSCOPIC ONLY
Bilirubin Urine: NEGATIVE
Glucose, UA: NEGATIVE mg/dL
Ketones, ur: NEGATIVE mg/dL
Leukocytes, UA: NEGATIVE
Protein, ur: NEGATIVE mg/dL

## 2011-03-09 LAB — POCT I-STAT 3, ART BLOOD GAS (G3+)
Acid-base deficit: 3 mmol/L — ABNORMAL HIGH (ref 0.0–2.0)
Acid-base deficit: 3 mmol/L — ABNORMAL HIGH (ref 0.0–2.0)
Acid-base deficit: 4 mmol/L — ABNORMAL HIGH (ref 0.0–2.0)
Bicarbonate: 21.1 mEq/L (ref 20.0–24.0)
Bicarbonate: 21.7 mEq/L (ref 20.0–24.0)
Bicarbonate: 21.8 mEq/L (ref 20.0–24.0)
O2 Saturation: 99 %
Patient temperature: 33.4
Patient temperature: 36.9
TCO2: 22 mmol/L (ref 0–100)
TCO2: 23 mmol/L (ref 0–100)
pH, Arterial: 7.451 — ABNORMAL HIGH (ref 7.350–7.450)
pO2, Arterial: 143 mmHg — ABNORMAL HIGH (ref 80.0–100.0)

## 2011-03-09 LAB — LIPID PANEL
Cholesterol: 255 mg/dL — ABNORMAL HIGH (ref 0–200)
HDL: 45 mg/dL (ref >39)
LDL Cholesterol: 193 mg/dL — ABNORMAL HIGH (ref 0–99)
Total CHOL/HDL Ratio: 5.7 RATIO
Triglycerides: 86 mg/dL (ref <150)
VLDL: 17 mg/dL (ref 0–40)

## 2011-03-09 LAB — CREATININE, SERUM
Creatinine, Ser: 0.52 mg/dL (ref 0.4–1.5)
Creatinine, Ser: 0.67 mg/dL (ref 0.4–1.5)
GFR calc Af Amer: >60 mL/min (ref >60)
GFR calc Af Amer: >60 mL/min (ref >60)
GFR calc non Af Amer: >60 mL/min (ref >60)
GFR calc non Af Amer: >60 mL/min (ref >60)

## 2011-03-09 LAB — BASIC METABOLIC PANEL
BUN: 11 mg/dL (ref 6–23)
BUN: 11 mg/dL (ref 6–23)
CO2: 24 mEq/L (ref 19–32)
CO2: 26 mEq/L (ref 19–32)
Calcium: 7.7 mg/dL — ABNORMAL LOW (ref 8.4–10.5)
Calcium: 7.9 mg/dL — ABNORMAL LOW (ref 8.4–10.5)
Chloride: 101 mEq/L (ref 96–112)
Chloride: 102 mEq/L (ref 96–112)
Creatinine, Ser: 0.63 mg/dL (ref 0.4–1.5)
Creatinine, Ser: 0.67 mg/dL (ref 0.4–1.5)
GFR calc Af Amer: >60 mL/min (ref >60)
GFR calc Af Amer: >60 mL/min (ref >60)
GFR calc non Af Amer: >60 mL/min (ref >60)
GFR calc non Af Amer: >60 mL/min (ref >60)
Glucose, Bld: 129 mg/dL — ABNORMAL HIGH (ref 70–99)
Glucose, Bld: 99 mg/dL (ref 70–99)
Potassium: 4.2 mEq/L (ref 3.5–5.1)
Potassium: 4.7 mEq/L (ref 3.5–5.1)
Sodium: 130 mEq/L — ABNORMAL LOW (ref 135–145)
Sodium: 130 mEq/L — ABNORMAL LOW (ref 135–145)

## 2011-03-09 LAB — PLATELET FUNCTION ASSAY: Collagen / Epinephrine: 95 seconds (ref 0–184)

## 2011-03-09 LAB — MAGNESIUM
Magnesium: 2.4 mg/dL (ref 1.5–2.5)
Magnesium: 2.5 mg/dL (ref 1.5–2.5)
Magnesium: 2.8 mg/dL — ABNORMAL HIGH (ref 1.5–2.5)

## 2011-03-09 LAB — POCT I-STAT 4, (NA,K, GLUC, HGB,HCT)
Glucose, Bld: 110 mg/dL — ABNORMAL HIGH (ref 70–99)
Glucose, Bld: 115 mg/dL — ABNORMAL HIGH (ref 70–99)
Glucose, Bld: 121 mg/dL — ABNORMAL HIGH (ref 70–99)
HCT: 36 % — ABNORMAL LOW (ref 39.0–52.0)
HCT: 37 % — ABNORMAL LOW (ref 39.0–52.0)
Hemoglobin: 12.6 g/dL — ABNORMAL LOW (ref 13.0–17.0)
Hemoglobin: 13.6 g/dL (ref 13.0–17.0)
Potassium: 3.4 mEq/L — ABNORMAL LOW (ref 3.5–5.1)
Potassium: 3.8 mEq/L (ref 3.5–5.1)
Potassium: 4.2 mEq/L (ref 3.5–5.1)
Potassium: 4.5 mEq/L (ref 3.5–5.1)
Sodium: 133 mEq/L — ABNORMAL LOW (ref 135–145)
Sodium: 133 mEq/L — ABNORMAL LOW (ref 135–145)
Sodium: 137 mEq/L (ref 135–145)

## 2011-03-09 LAB — POCT I-STAT, CHEM 8
BUN: 10 mg/dL (ref 6–23)
BUN: 13 mg/dL (ref 6–23)
Calcium, Ion: 1.12 mmol/L (ref 1.12–1.32)
Chloride: 102 mEq/L (ref 96–112)
Creatinine, Ser: 0.5 mg/dL (ref 0.4–1.5)
Creatinine, Ser: 0.7 mg/dL (ref 0.4–1.5)
Glucose, Bld: 107 mg/dL — ABNORMAL HIGH (ref 70–99)
Glucose, Bld: 136 mg/dL — ABNORMAL HIGH (ref 70–99)
Hemoglobin: 11.9 g/dL — ABNORMAL LOW (ref 13.0–17.0)
TCO2: 25 mmol/L (ref 0–100)

## 2011-03-09 LAB — HEMOGLOBIN A1C
Hgb A1c MFr Bld: 5.8 % — ABNORMAL HIGH (ref <5.7)
Mean Plasma Glucose: 120 mg/dL — ABNORMAL HIGH (ref <117)

## 2011-03-09 LAB — TYPE AND SCREEN
ABO/RH(D): A POS
Antibody Screen: NEGATIVE

## 2011-03-09 LAB — CK TOTAL AND CKMB (NOT AT ARMC)
CK, MB: 8.1 ng/mL (ref 0.3–4.0)
Relative Index: 4 — ABNORMAL HIGH (ref 0.0–2.5)
Total CK: 203 U/L (ref 7–232)

## 2011-03-09 LAB — PROTIME-INR
INR: 1.05 (ref 0.00–1.49)
INR: 1.23 (ref 0.00–1.49)
Prothrombin Time: 13.9 seconds (ref 11.6–15.2)
Prothrombin Time: 15.7 seconds — ABNORMAL HIGH (ref 11.6–15.2)

## 2011-03-09 LAB — APTT
aPTT: 24 seconds (ref 24–37)
aPTT: 29 seconds (ref 24–37)

## 2011-03-09 NOTE — Progress Notes (Signed)
Summary: Office Visit/  BLOOD PRESSURE READINGS  Office Visit/  BLOOD PRESSURE READINGS   Imported By: Dorise Hiss 03/03/2011 11:47:59  _____________________________________________________________________  External Attachment:    Type:   Image     Comment:   External Document  Appended Document: Office Visit/  BLOOD PRESSURE READINGS Reviewed. Continue same therapy for now.  Appended Document: Office Visit/  BLOOD PRESSURE READINGS Pt notified and verbalized understanding.

## 2011-03-16 NOTE — Miscellaneous (Signed)
Summary: Rehab Report/  CARDIAC  REHAB PROGRESS REPORT  Rehab Report/  CARDIAC  REHAB PROGRESS REPORT   Imported By: Dorise Hiss 03/10/2011 09:46:08  _____________________________________________________________________  External Attachment:    Type:   Image     Comment:   External Document

## 2011-03-17 ENCOUNTER — Telehealth: Payer: Self-pay | Admitting: *Deleted

## 2011-03-17 DIAGNOSIS — I10 Essential (primary) hypertension: Secondary | ICD-10-CM

## 2011-03-17 NOTE — Telephone Encounter (Signed)
Pt thinks he is experiencing side effects of metoprolol. He states he takes meds between 5-6 am. By 7am, he has the coldest hands you could imagine. He states they are fine when he wakes up but get cold after about 1-2 hours after metoprolol. He states they even turned blue one day last week. He states the med info he received from [facility] says to notify MD if you experience extremities becoming cold. He would like to know what he should do. He is aware a note will be sent Dr. Diona Browner for further review.

## 2011-03-18 NOTE — Telephone Encounter (Signed)
Could consider discontinuing Toprol-XL 25 mg daily, and try atenolol 25 mg daily. He may tolerate this better.

## 2011-03-19 MED ORDER — ATENOLOL 25 MG PO TABS: 25.0000 mg | ORAL_TABLET | Freq: Every day | ORAL | Status: DC

## 2011-03-19 NOTE — Telephone Encounter (Signed)
Pt notified of Dr. Ival Bible recommendation. Pt would like to try the Atenolol. He has "loaded" his pill box so he's going to wait until time to refill it again to make the switch. He will call me after a week or so on Atenolol to let me know how he's doing.

## 2011-03-19 NOTE — Telephone Encounter (Signed)
Addended by: Cyril Loosen on: 03/19/2011 05:20 PM   Modules accepted: Orders

## 2011-04-09 ENCOUNTER — Telehealth: Payer: Self-pay | Admitting: *Deleted

## 2011-04-09 NOTE — Telephone Encounter (Signed)
Cut the atenolol dose in half and try this for at least a week or 2. If not effective, discontinue altogether.

## 2011-04-09 NOTE — Telephone Encounter (Signed)
Message left on nurse's voicemail that his atenolol dose isn't helping his hands and they are still staying cold. Please advise.

## 2011-04-12 NOTE — Telephone Encounter (Signed)
Left message for patient to call office.  

## 2011-04-13 NOTE — Telephone Encounter (Signed)
Patient informed of

## 2011-04-14 IMAGING — CR DG CHEST 2V
2 series · 2 of 2 positions shown · non-contrast
Comparison: 12/09/2010, 12/08/2010, 12/07/2010.

CLINICAL DATA: CABG

CHEST - 2 VIEW

[w chest pa]
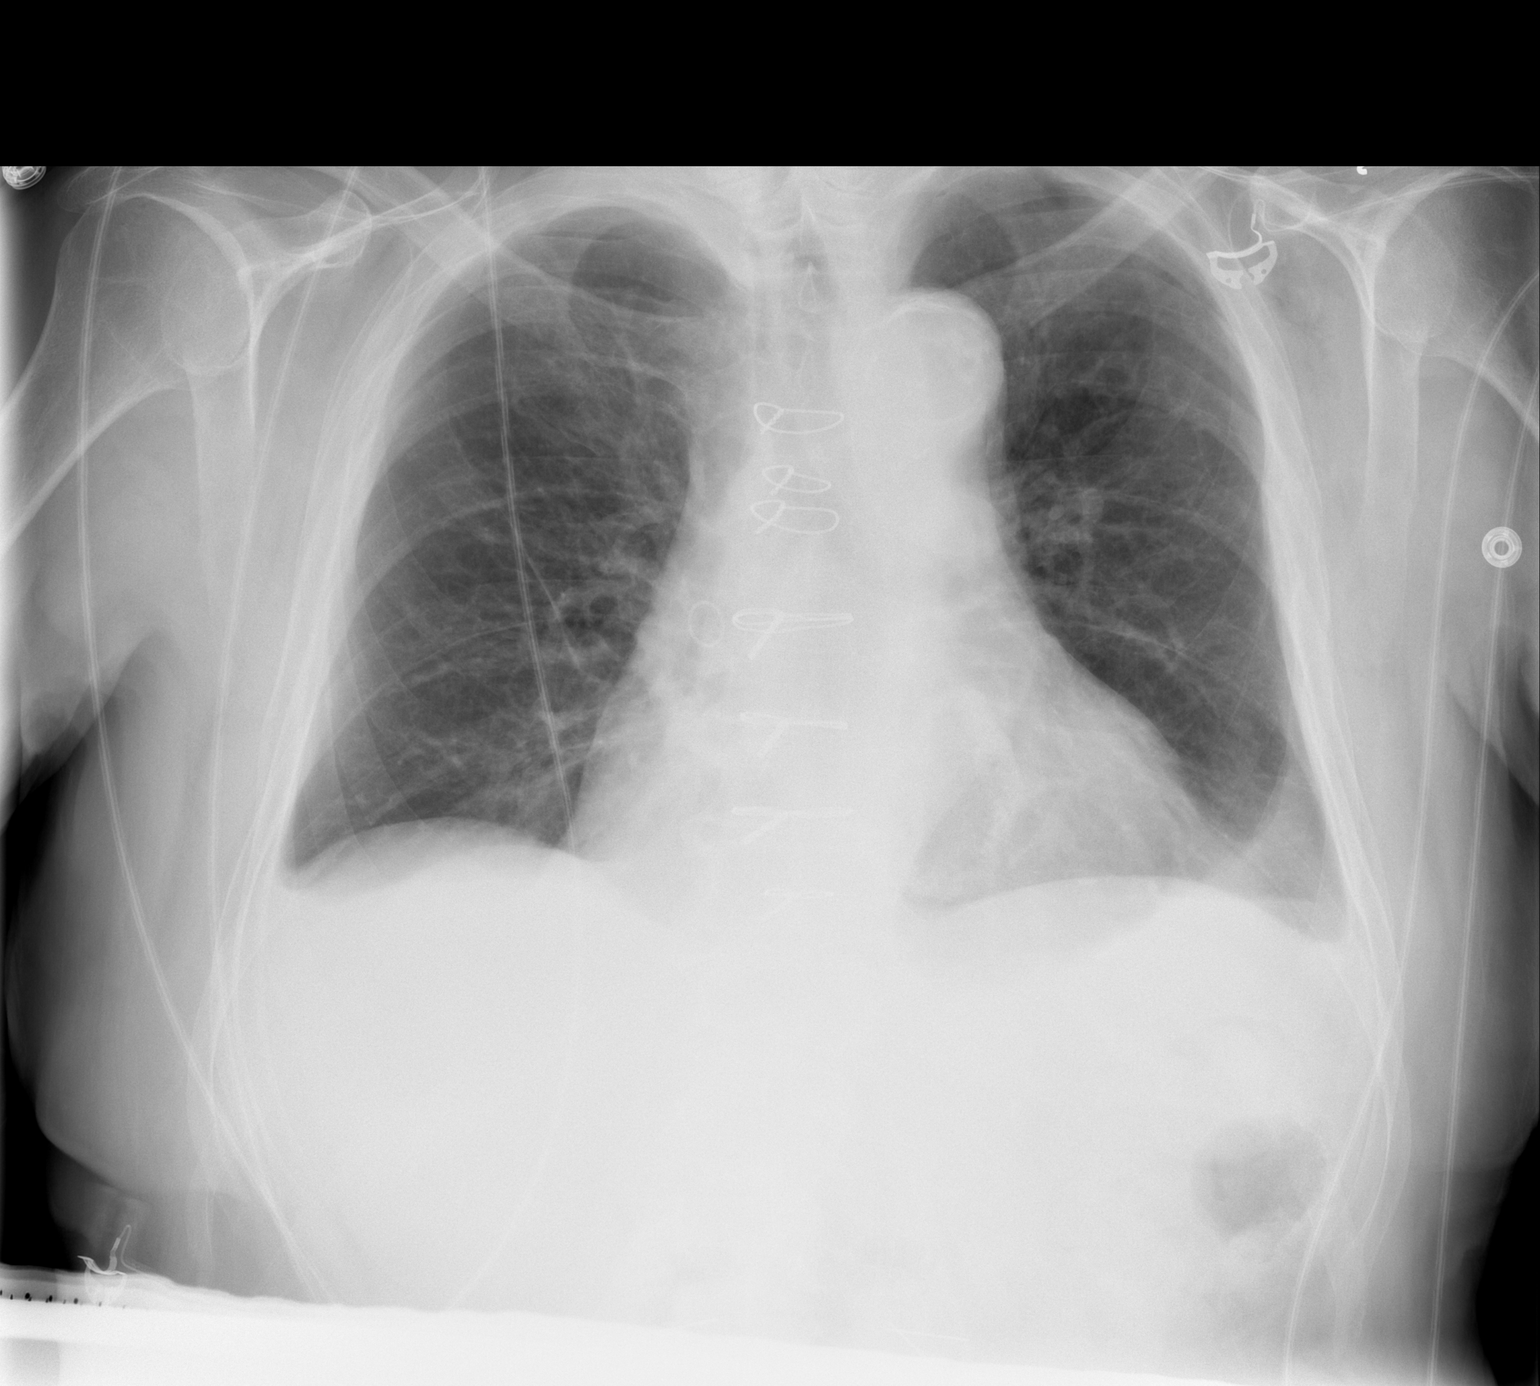

[w chest lat]
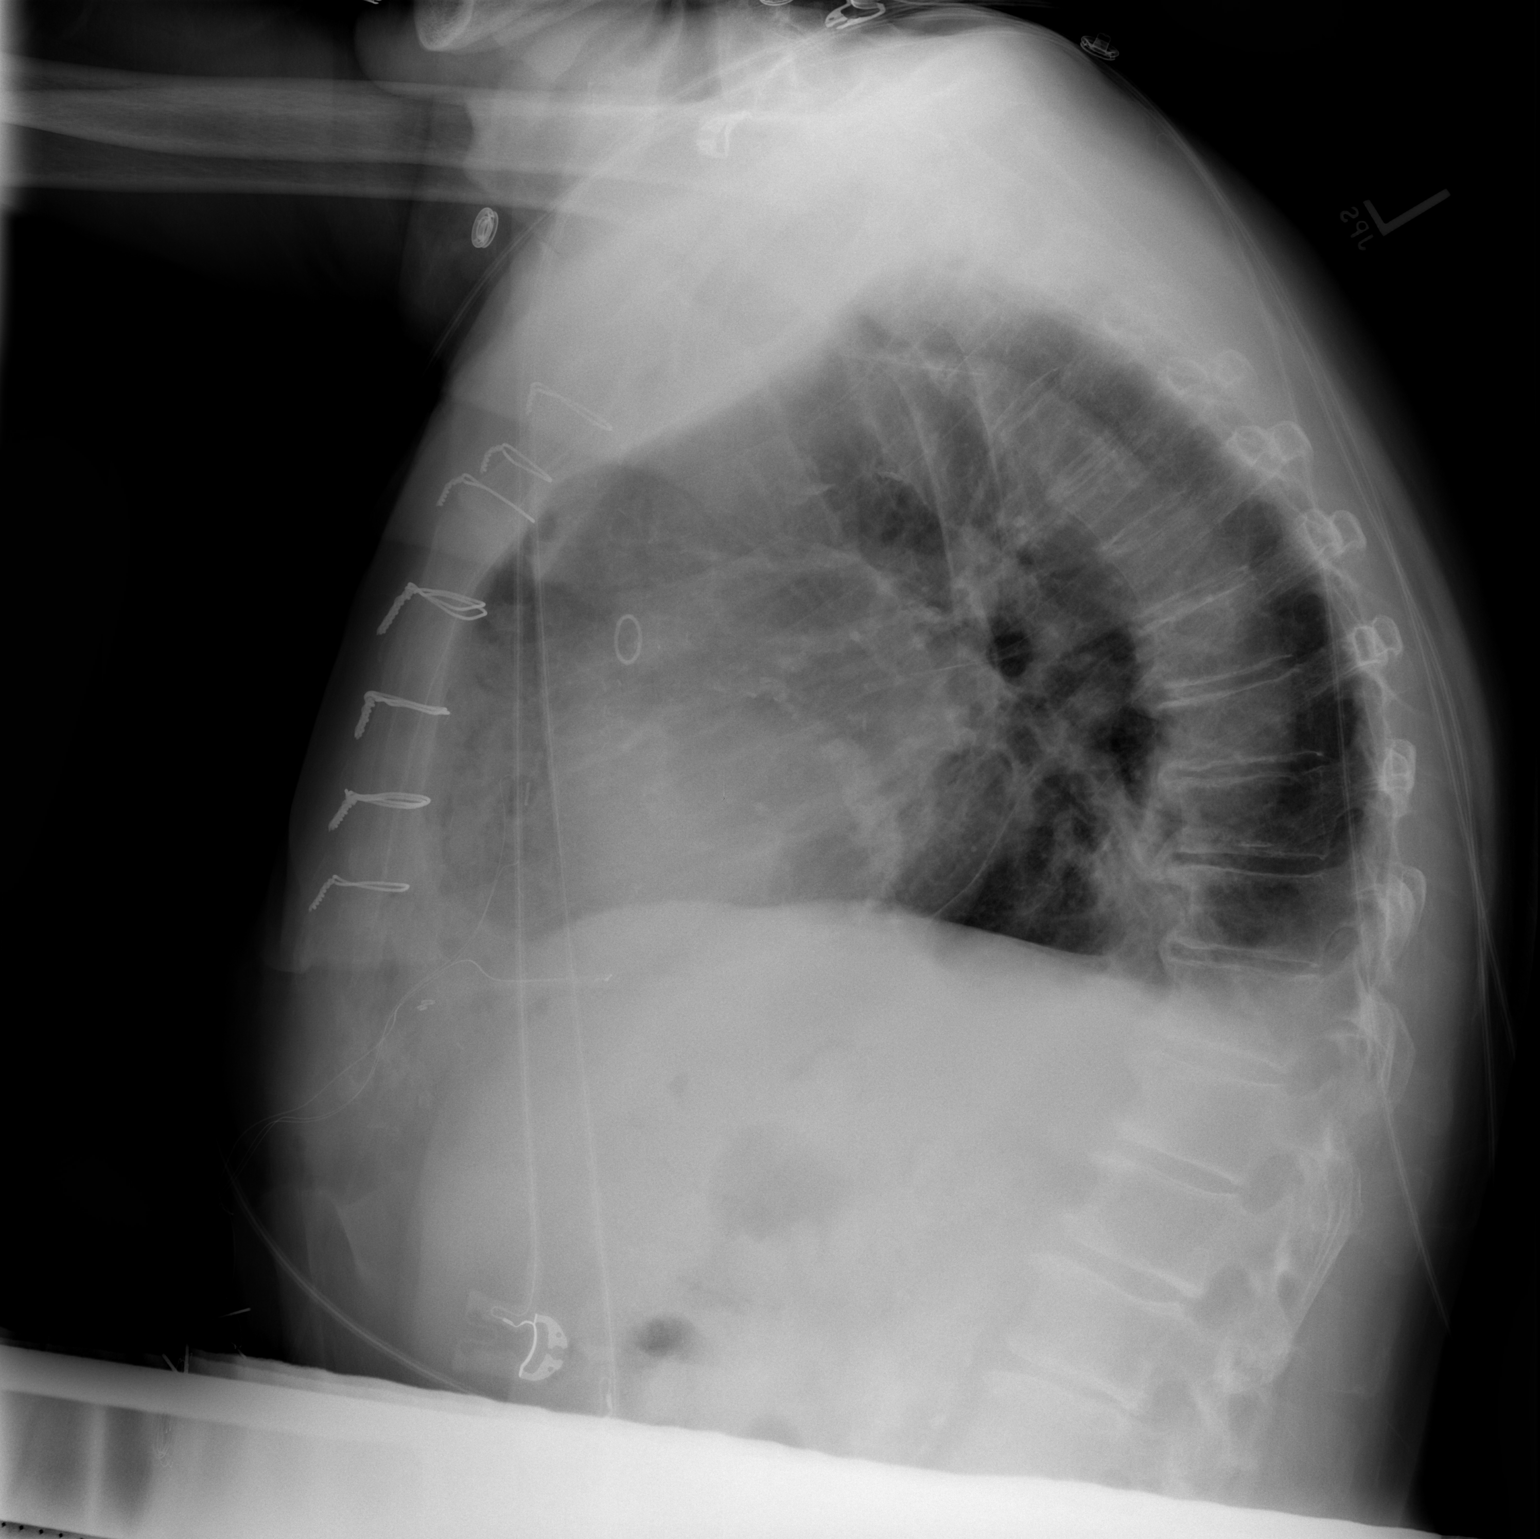

[2 of 2 positions shown; findings below may reference images not displayed]

FINDINGS: There are changes of median sternotomy for CABG.  In the
lateral projection, increased density and wall locules of
pneumomediastinum are consistent with peri recent postoperative
changes of the chest.  Epicardial  pacing wires present.  Heavy
mitral annulus calcifications noted.  There is a focal opacity at
the left lung base, favored to be atelectasis.  There is no edema
or pneumothorax.

Costophrenic angles are minimally blunted.  Tiny amount of
subcutaneous emphysema is seen in the left shoulder region. The
patient is kyphotic.   Vertebral body heights appear fairly
symmetric, with no focal compression deformity identified.
IMPRESSION: 1.  Postoperative changes of median sternotomy for CABG.  Added
density and small locules of pneumomediastinum and anterior
mediastinum are not unexpected.
2.  Left basilar opacities favored to be due to atelectasis.
Airspace disease not and not excluded.
3.  Tiny bilateral pleural effusions.

## 2011-04-15 IMAGING — CR DG CHEST 1V PORT
1 series · 1 of 1 positions shown · non-contrast
Comparison: Chest radiograph 12/10/2010

CLINICAL DATA: Chest pain

PORTABLE CHEST - 1 VIEW

[AP]
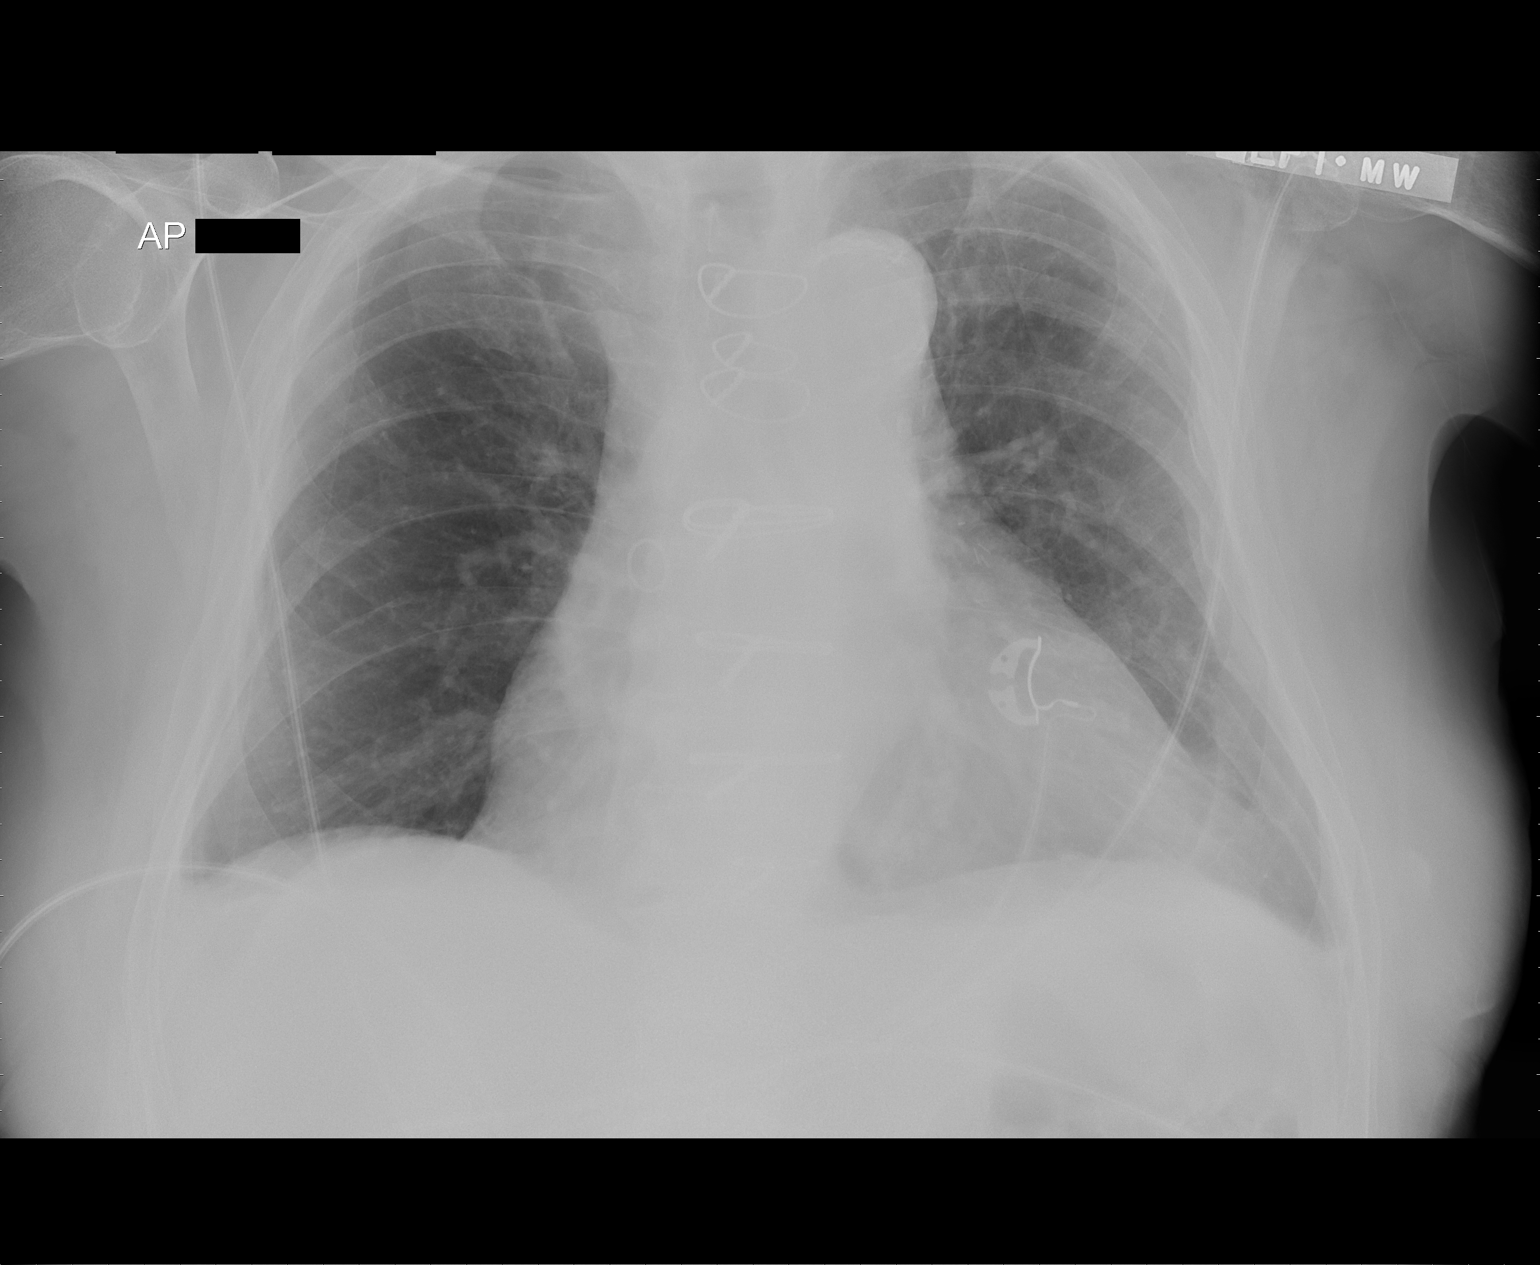

[1 of 1 positions shown; findings below may reference images not displayed]

FINDINGS: Sternotomy wires overlie stable enlarged heart
silhouette.  Costophrenic angles are clear.  No evidence
infiltrate, or pneumothorax.  Trace effusions improved from prior.
IMPRESSION: 1.  Improved a small effusions.  Cardiomegaly.

## 2011-04-20 ENCOUNTER — Ambulatory Visit (INDEPENDENT_AMBULATORY_CARE_PROVIDER_SITE_OTHER): Payer: Medicare Other | Admitting: Cardiology

## 2011-04-20 ENCOUNTER — Encounter: Payer: Self-pay | Admitting: *Deleted

## 2011-04-20 ENCOUNTER — Encounter: Payer: Self-pay | Admitting: Cardiology

## 2011-04-20 VITALS — BP 121/77 | HR 69 | Ht 66.0 in | Wt 184.0 lb

## 2011-04-20 DIAGNOSIS — E782 Mixed hyperlipidemia: Secondary | ICD-10-CM

## 2011-04-20 DIAGNOSIS — I2510 Atherosclerotic heart disease of native coronary artery without angina pectoris: Secondary | ICD-10-CM

## 2011-04-20 DIAGNOSIS — R030 Elevated blood-pressure reading, without diagnosis of hypertension: Secondary | ICD-10-CM

## 2011-04-20 MED ORDER — ATORVASTATIN CALCIUM 10 MG PO TABS: 10.0000 mg | ORAL_TABLET | Freq: Every day | ORAL | Status: DC

## 2011-04-20 NOTE — Assessment & Plan Note (Signed)
Clinically stable, continue medical therapy and observation. Encouraged continued exercise regimen.

## 2011-04-20 NOTE — Assessment & Plan Note (Signed)
Initiate Lipitor 10 mg daily, followup fasting lipid profile and liver function tests in 3 months.

## 2011-04-20 NOTE — Patient Instructions (Signed)
Your physician wants you to follow-up in: 6 months. You will receive a reminder letter in the mail one-two months in advance. If you don't receive a letter, please call our office to schedule the follow-up appointment. Your physician recommends that you go to the [facility] for a FASTING lipid profile and liver function labs. Do not eat or drink after midnight. DO IN 3 MONTHS. Start Lipitor 10mg  Take 1 tablet by mouth every night.

## 2011-04-20 NOTE — Progress Notes (Signed)
Clinical Summary Mr. Meiklejohn is a 77 y.o.male presenting for followup. He was seen in February 2012. Crestor was discontinued at that visit due to concerns about weakness and some memory difficulties. Symptoms have improved. He states that he was able to tolerate low-dose Lipitor in the years past.  Since his last visit, he had concerns about side effects with Toprol-XL. This was changed to atenolol, which was ultimately decreased in dose. He states he is feeling better, less feeling of cold sensation in his hands.  Denies any active angina or progressive shortness of breath. Has functional limitations related to arthritis. Still gets out to walk his dog and do other activities of daily living.   Allergies  Allergen Reactions  . Iodinated Diagnostic Agents     Current outpatient prescriptions:aspirin 81 MG tablet, Take 81 mg by mouth daily.  , Disp: , Rfl: ;  atenolol (TENORMIN) 25 MG tablet, Take 12.5 mg by mouth daily.  , Disp: , Rfl: ;  levothyroxine (SYNTHROID, LEVOTHROID) 125 MCG tablet, Take 125 mcg by mouth daily.  , Disp: , Rfl: ;  loratadine (CLARITIN) 10 MG tablet, Take 10 mg by mouth daily.  , Disp: , Rfl:  mometasone (NASONEX) 50 MCG/ACT nasal spray, 2 sprays by Nasal route daily.  , Disp: , Rfl: ;  nitroGLYCERIN (NITROSTAT) 0.4 MG SL tablet, Place 0.4 mg under the tongue every 5 (five) minutes as needed.  , Disp: , Rfl: ;  omeprazole (PRILOSEC) 20 MG capsule, Take 20 mg by mouth 2 (two) times daily.  , Disp: , Rfl: ;  DISCONTD: atenolol (TENORMIN) 25 MG tablet, Take 1 tablet (25 mg total) by mouth daily., Disp: 30 tablet, Rfl: 6 atorvastatin (LIPITOR) 10 MG tablet, Take 1 tablet (10 mg total) by mouth at bedtime., Disp: 30 tablet, Rfl: 6;  DISCONTD: CRESTOR 5 MG tablet, Take 5 tablets by mouth Daily., Disp: , Rfl: ;  DISCONTD: cyclobenzaprine (FLEXERIL) 10 MG tablet, Take 10 mg by mouth daily.  , Disp: , Rfl: ;  DISCONTD: traMADol (ULTRAM) 50 MG tablet, Take 50 mg by mouth every 6 (six)  hours as needed.  , Disp: , Rfl:   Past Medical History  Diagnosis Date  . Coronary atherosclerosis of native coronary artery     Two-vessel, LVEF 60%  . Myocardial infarction     1980, managed conservatively  . Hypothyroidism   . GERD (gastroesophageal reflux disease)   . Herpes zoster   . Hx of colonic polyps   . Mixed hyperlipidemia   . Arthritis   . Rosacea   . Urge incontinence   . Allergic rhinitis   . Seborrheic dermatitis     Social History Mr. Senner reports that he quit smoking about 32 years ago. His smoking use included Cigarettes. He has a 30 pack-year smoking history. He has never used smokeless tobacco. Mr. Stracke reports that he drinks alcohol.  Review of Systems Continues to exercise in the maintenance cardiac rehabilitation program. Stable appetite. Musculoskeletal, postsurgical chest discomfort occasionally. Otherwise reviewed and negative.  Physical Examination Filed Vitals:   04/20/11 1324  BP: 121/77  Pulse: 69   Overweight elderly male in no acute distress, without active symptoms. HEENT: Conjunctiva and lids normal, oropharynx with moist mucosa. Neck: Supple, no elevated JVP or carotid bruits, no thyromegaly. Lungs: Decreased breath sounds, clear however, no labored breathing or wheezing. Cardiac: Regular rate and rhythm, no S3 gallop or rub. Chest: Well healing midline sternal incision. Abdomen: Soft, nontender, bowel sounds present, no bruits.  Extremities: No pitting edema, distal pulses one plus. Musculoskeletal: Mild kyphosis noted. Skin: Warm and dry. Neuropsychiatric: Alert and oriented x3, affect appropriate.    Problem List and Plan

## 2011-04-20 NOTE — Assessment & Plan Note (Signed)
Outpatient records reviewed. Continue to follow, sodium restriction, exercise.

## 2011-05-05 IMAGING — CR DG CHEST 2V
2 series · 2 of 2 positions shown · non-contrast
Comparison: Portable chest x-ray of 12/11/2010

CLINICAL DATA: CABG on 12/07/2010, follow-up

CHEST - 2 VIEW

[view not recorded (1 of 2)]
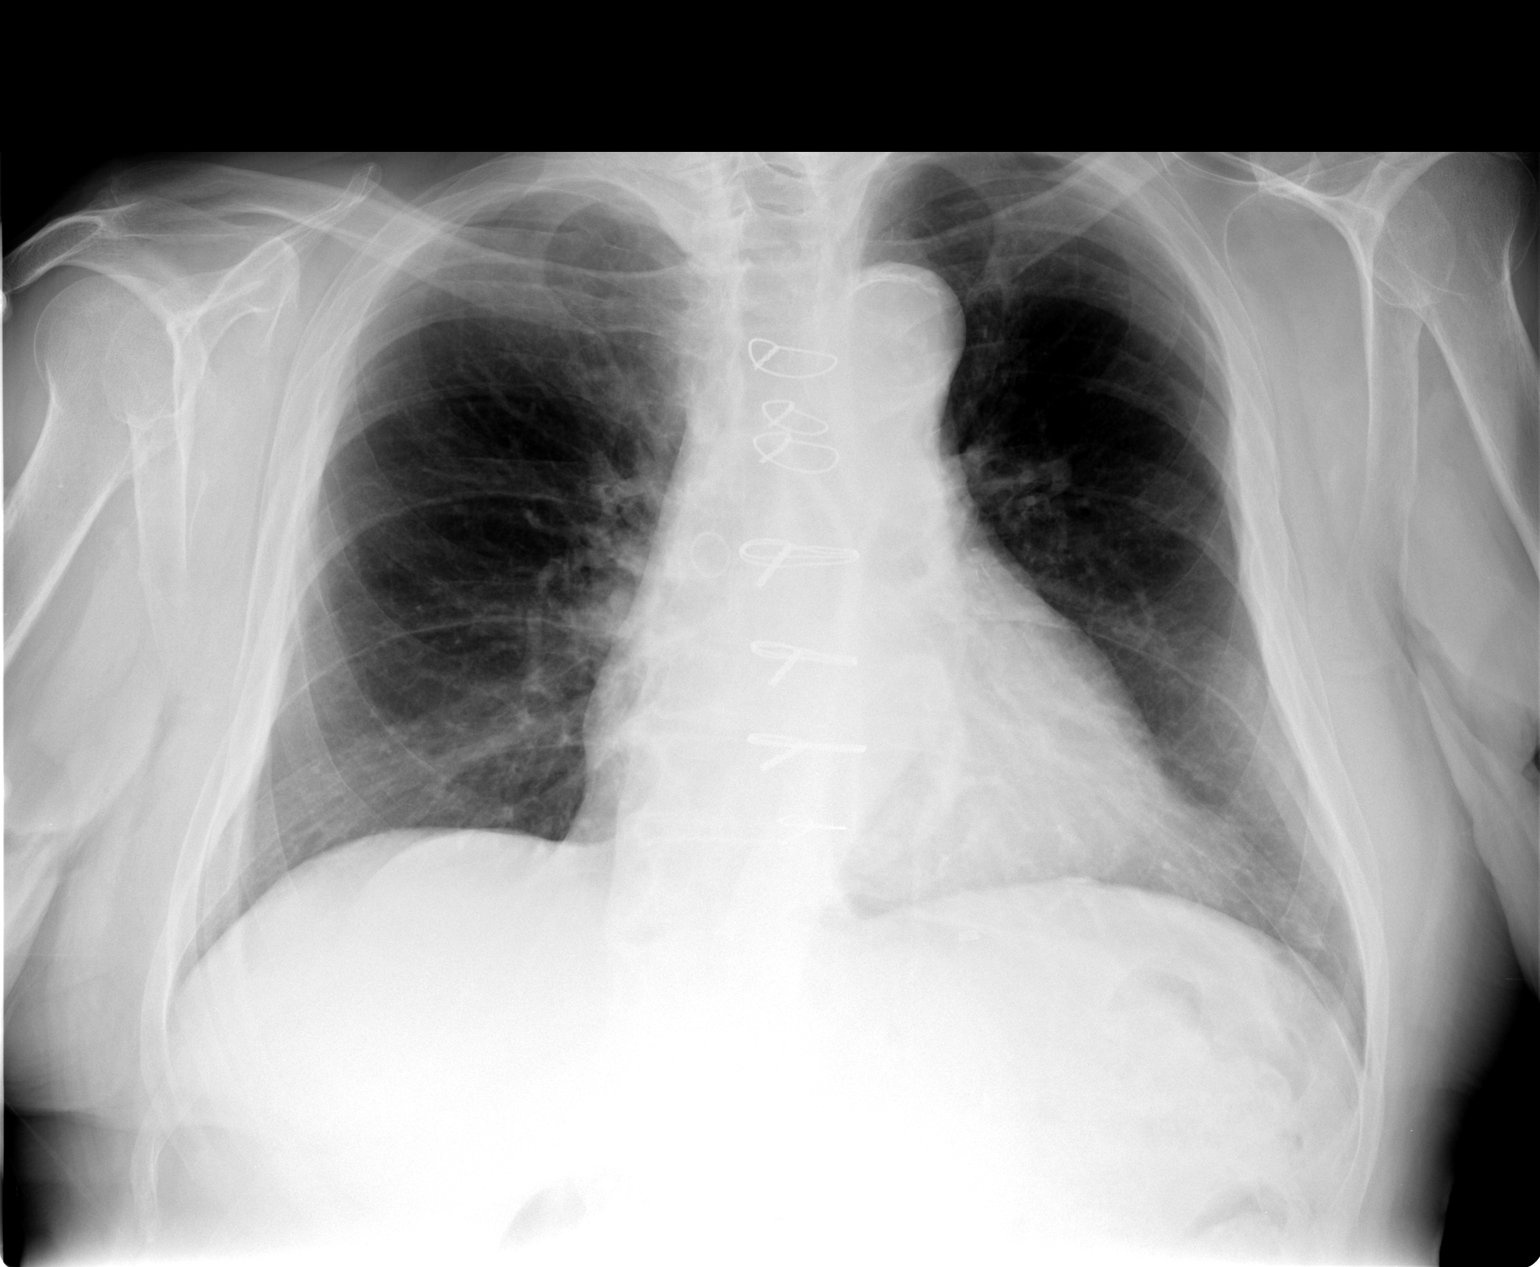

[view not recorded (2 of 2)]
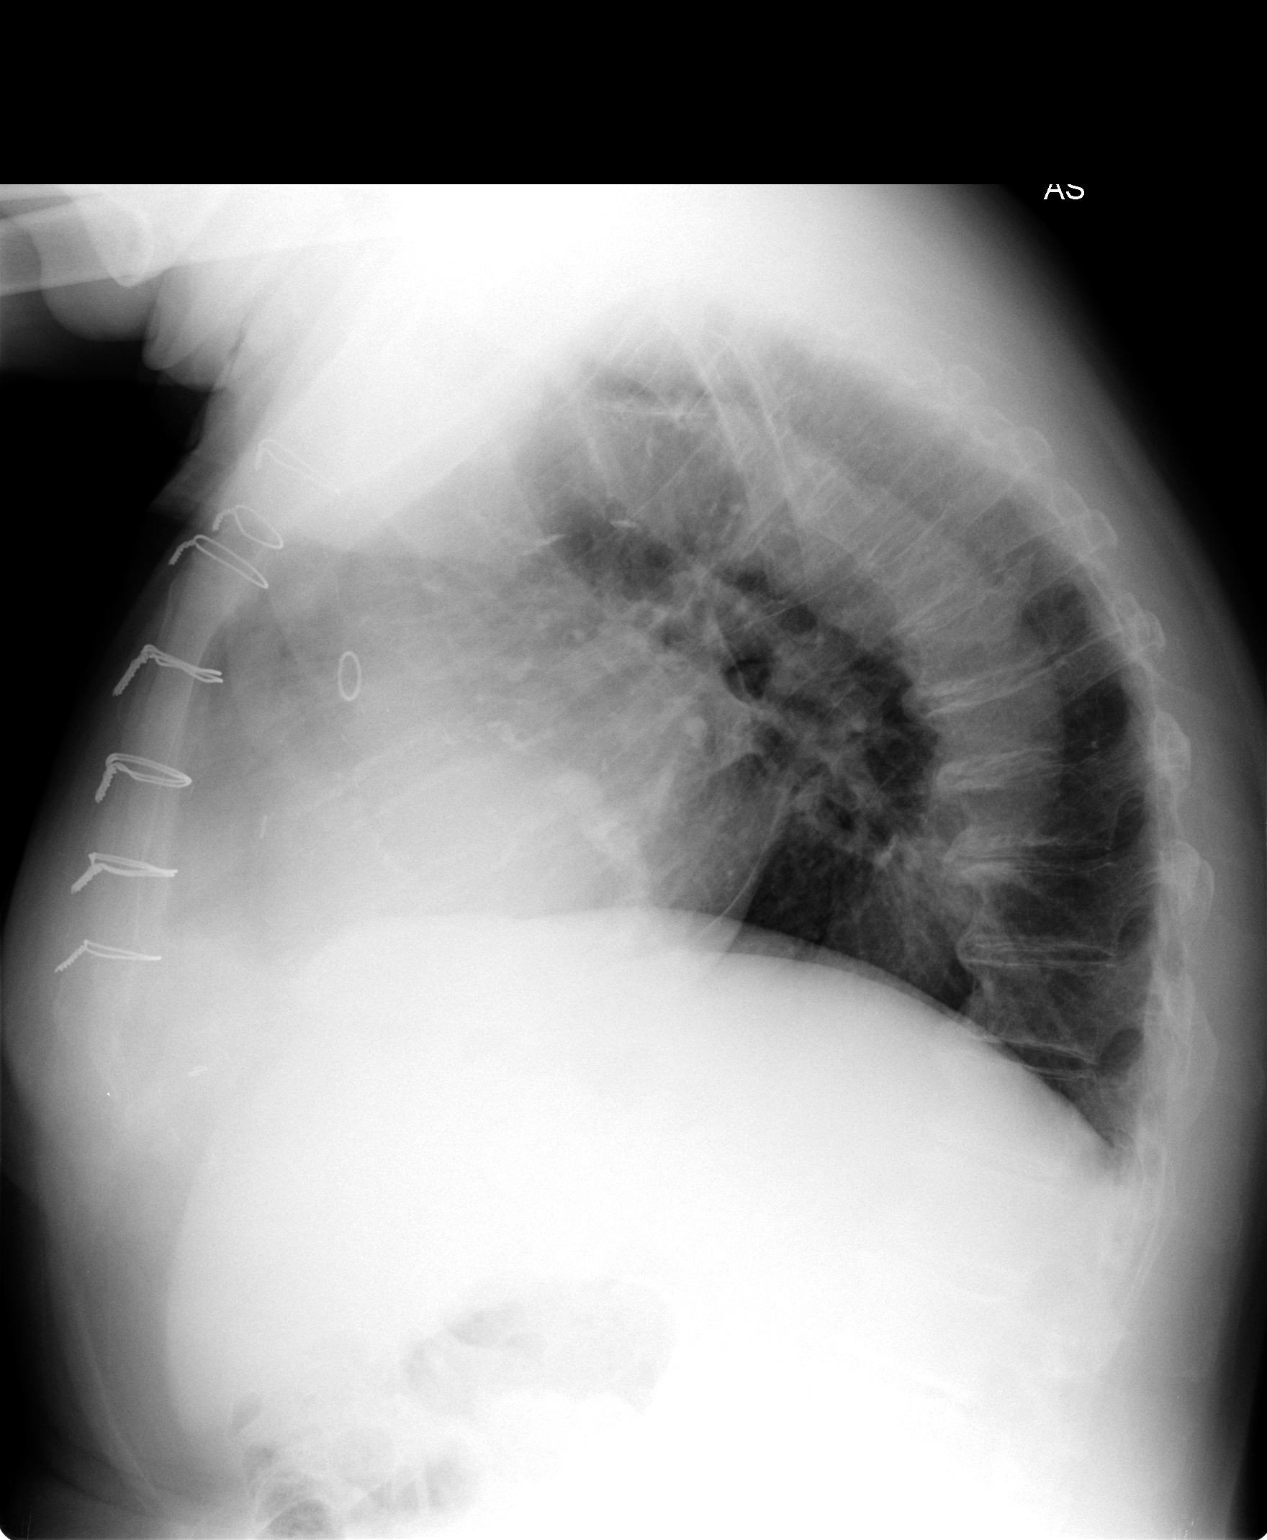

[2 of 2 positions shown; findings below may reference images not displayed]

FINDINGS: The lungs are clear.  Cardiomegaly is stable.  Median
sternotomy sutures are noted.  There are degenerative changes
throughout the thoracic spine.
IMPRESSION: Stable cardiomegaly.  No active lung disease.

## 2011-05-11 NOTE — Assessment & Plan Note (Signed)
OFFICE VISIT   [name], [name]  DOB:  1931/03/21                                        December 31, 2010  CHART #:  [id number]   The patient returns to office today in followup after his recent off-  pump coronary artery bypass grafting x2 on December 07, 2010.  Considering his age of 11 years and somewhat reluctant to proceed with  surgery, he is doing extremely well.  He notes that his family has now  returned home and he is living on his own.  He has had no recurrent  angina.  He had previously been bothered by episodes of hematuria.  He  has noted some increasing nocturia, but no hematuria.  He does have a  followup appointment to see Dr. Baldo Ash, urologist in [place] and  urologist in [place] in the coming week.   PHYSICAL EXAMINATION:  Vital Signs:  On exam, his blood pressure 150/88,  pulse 77, respiratory rate is 18, O2 sat is 100%.  General:  His sternum  is stable and well healed.  Lungs:  Clear.  His vein harvest site is  healing well without any calf tenderness or pedal edema.   Followup chest x-ray shows clear lung fields.   He continues on Crestor dose of 5 mg today.  Prior to surgery, he had  noted some intolerance of statins in the past, especially high-dose  Lipitor so postoperatively, we started back on low-dose Crestor.  So  far, he notes that he is tolerating this from muscle soreness  standpoint.  He is also on aspirin 325 a day, Toprol-XL 25 a day,  Flexeril, loratadine 10 mg a day, Prilosec 20 mg a day, and Synthroid  125 mcg a day.   Overall, I am very pleased with his progress.  I have encouraged him to  starting the cardiac rehab program in [place].  I have not made him a  return appointment to see me, but would be glad to see  him at his or Dr. Ival Bible request at anytime.  He does have followup  appointments with Dr. Clelia Croft and Dr. Baldo Ash in [place].   Sheliah Plane, MD  Electronically Signed   EG/MEDQ  D:  12/31/2010  T:   12/31/2010  Job:  [id number]   cc:   Jonelle Sidle, MD  Verne Carrow, MD  Kirstie Peri, MD  Beatris Ship, MD

## 2011-06-28 ENCOUNTER — Other Ambulatory Visit: Payer: Self-pay | Admitting: *Deleted

## 2011-06-28 DIAGNOSIS — E782 Mixed hyperlipidemia: Secondary | ICD-10-CM

## 2011-06-28 DIAGNOSIS — Z79899 Other long term (current) drug therapy: Secondary | ICD-10-CM

## 2011-07-05 ENCOUNTER — Telehealth: Payer: Self-pay | Admitting: *Deleted

## 2011-07-05 NOTE — Telephone Encounter (Signed)
Notes Recorded by Jonelle Sidle, MD on 07/02/2011 at 11:51 AM LDL has increased to 171. He was taken off Crestor given side effects, placed on Lipitor 10 mg daily. If he has tolerated Lipitor, consider increasing to 20 mg daily.   Left message to call back on voicemail.

## 2011-07-06 NOTE — Telephone Encounter (Signed)
Pt states he was not able to tolerate the Crestor or Lipitor. He is not taking anything at present for cholesterol. He said "quality is more important than quantity."

## 2011-07-07 MED ORDER — SIMVASTATIN 10 MG PO TABS: 10.0000 mg | ORAL_TABLET | ORAL | Status: DC

## 2011-07-07 NOTE — Telephone Encounter (Signed)
Pt will try Simvastatin 10 mg every other day as recommended. He will notify if he is able to tolerate.

## 2011-07-07 NOTE — Telephone Encounter (Signed)
Left message to call back on voice mail

## 2011-07-07 NOTE — Telephone Encounter (Signed)
Pt states he is willing to try something else. He states he is not sure what he can take as he has tried several medications. Pt states he has tried Crestor, Lipitor, Pravachol and something else that he cannot remember the name of. He does not think he has taken simvastatin in the past.   He states a message can be left on his voicemail and rx called into [facility].

## 2011-10-25 ENCOUNTER — Ambulatory Visit: Payer: Medicare Other | Admitting: Cardiology

## 2011-11-01 ENCOUNTER — Encounter: Payer: Self-pay | Admitting: Cardiology

## 2011-11-03 ENCOUNTER — Encounter: Payer: Self-pay | Admitting: Cardiology

## 2011-11-03 ENCOUNTER — Ambulatory Visit (INDEPENDENT_AMBULATORY_CARE_PROVIDER_SITE_OTHER): Payer: Medicare Other | Admitting: Cardiology

## 2011-11-03 VITALS — BP 164/96 | HR 71 | Ht 66.0 in | Wt 187.0 lb

## 2011-11-03 DIAGNOSIS — Z79899 Other long term (current) drug therapy: Secondary | ICD-10-CM

## 2011-11-03 DIAGNOSIS — I2510 Atherosclerotic heart disease of native coronary artery without angina pectoris: Secondary | ICD-10-CM

## 2011-11-03 DIAGNOSIS — E782 Mixed hyperlipidemia: Secondary | ICD-10-CM

## 2011-11-03 DIAGNOSIS — R030 Elevated blood-pressure reading, without diagnosis of hypertension: Secondary | ICD-10-CM

## 2011-11-03 NOTE — Patient Instructions (Signed)
Your physician wants you to follow-up in: 6 months. You will receive a reminder letter in the mail one-two months in advance. If you don't receive a letter, please call our office to schedule the follow-up appointment. Your physician recommends that you continue on your current medications as directed. Please refer to the Current Medication list given to you today. Your physician recommends that you go to the [facility] for a FASTING lipid profile and liver function labs. Do not eat or drink after midnight.  If the results of your test are normal or stable, you will receive a letter. If they are abnormal, the nurse will contact you by phone.  

## 2011-11-03 NOTE — Progress Notes (Signed)
Clinical Summary [name] is a 77 y.o.male presenting for followup. He was seen in April. At that visit Lipitor was initiated. He was unfortunately not able to tolerate even low dose, and followup lab work in July showed AST 18, ALT 15, triglycerides 126, cholesterol 240, HDL 44, LDL 171. Low dose of simvastatin was then attempted. He states that he has tolerated this so far.   He reports no angina or limiting shortness of breath. Still feels like he has not quite regained his "energy," however he is able to exercise 5 days a week. Specifically he rides a stationary bicycle for 5 miles, exercises on the Nustep machine for one mile, also walks his dog and works regularly.  Followup ECG is reviewed below.  He does describe occasional aches and pains sometimes in his legs and right shoulder. Musculoskeletal in description.  He states that he has not been checking his blood pressure specifically, and we discussed this today. Measurement is elevated again. Actually, his diet seems to be fairly good. We discussed sodium restriction.   Allergies  Allergen Reactions  . Iodinated Diagnostic Agents     Medication list reviewed.  Past Medical History  Diagnosis Date  . Coronary atherosclerosis of native coronary artery     Two-vessel, LVEF 60%  . Myocardial infarction     1980, managed conservatively  . Hypothyroidism   . GERD (gastroesophageal reflux disease)   . Herpes zoster   . Hx of colonic polyps   . Mixed hyperlipidemia   . Arthritis   . Rosacea   . Urge incontinence   . Allergic rhinitis   . Seborrheic dermatitis     Past Surgical History  Procedure Date  . Coronary artery bypass graft 12/11    Dr. Tyrone Sage - off pump LIMA to LAD, SVG to RCA  . Tonsillectomy   . Submandibular salivary gland resection     Family History  Problem Relation Age of Onset  . Coronary artery disease      Social History [name] reports that he quit smoking about 32 years ago. His  smoking use included Cigarettes. He has a 30 pack-year smoking history. He has never used smokeless tobacco. [name] reports that he drinks alcohol.  Review of Systems No palpitations, dizziness, syncope. No bleeding problems. Otherwise negative.  Physical Examination Filed Vitals:   11/03/11 0816  BP: 164/96  Pulse: 71    Overweight elderly male in no acute distress, without active symptoms.  HEENT: Conjunctiva and lids normal, oropharynx with moist mucosa.  Neck: Supple, no elevated JVP or carotid bruits, no thyromegaly.  Lungs: Decreased breath sounds, clear however, no labored breathing or wheezing.  Cardiac: Regular rate and rhythm, no S3 gallop or rub.  Chest: Well healing midline sternal incision.  Abdomen: Soft, nontender, bowel sounds present, no bruits.  Extremities: No pitting edema, distal pulses one plus.  Musculoskeletal: Mild kyphosis noted.  Skin: Warm and dry.  Neuropsychiatric: Alert and oriented x3, affect appropriate.   ECG Sinus rhythm with left anterior fascicular block, nonspecific T wave changes.   Problem List and Plan

## 2011-11-03 NOTE — Assessment & Plan Note (Signed)
Again noted today. We reviewed his diet, discussed sodium restriction. I asked him to make a recording of blood pressure readings at cardiac rehabilitation, also from home. He has made it clear that he does not want to consider medical therapy unless absolutely necessary. My concern is that his blood pressure control will not be optimal without medication ultimately, unless this trend improves.

## 2011-11-03 NOTE — Assessment & Plan Note (Signed)
Symptomatically stable on present regimen. We discussed continuing exercise. His diet seems to be fairly good. ECG reviewed.

## 2011-11-03 NOTE — Assessment & Plan Note (Signed)
Followup fasting lipid profile and liver function tests.

## 2011-11-10 ENCOUNTER — Telehealth: Payer: Self-pay | Admitting: *Deleted

## 2011-11-10 NOTE — Telephone Encounter (Signed)
Message copied by Arlyss Gandy on Wed Nov 10, 2011 11:15 AM ------      Message from: MCDOWELL, Illene Bolus      Created: Wed Nov 10, 2011 10:42 AM       Reviewed. LFTs normal. Lipid numbers are improving, cholesterol down to 208, LDL down to 145. Would he consider trying to take simvastatin 10 mg daily instead of every other day?

## 2011-11-10 NOTE — Telephone Encounter (Signed)
Pt notified of results and verbalized understanding. Pt states he is having aching in all of his joints. He isn't sure if this is being caused by the simvastatin or his arthritis. He doesn't want to take simvastatin daily until he determines what is causing joint pain.

## 2012-01-12 ENCOUNTER — Other Ambulatory Visit: Payer: Self-pay | Admitting: Cardiology

## 2012-05-01 ENCOUNTER — Ambulatory Visit: Payer: Medicare Other | Admitting: Cardiology

## 2012-05-03 ENCOUNTER — Encounter: Payer: Self-pay | Admitting: Cardiology

## 2012-05-03 ENCOUNTER — Ambulatory Visit (INDEPENDENT_AMBULATORY_CARE_PROVIDER_SITE_OTHER): Payer: Medicare Other | Admitting: Cardiology

## 2012-05-03 VITALS — BP 132/78 | HR 66 | Ht 65.0 in | Wt 186.0 lb

## 2012-05-03 DIAGNOSIS — E782 Mixed hyperlipidemia: Secondary | ICD-10-CM

## 2012-05-03 DIAGNOSIS — I2510 Atherosclerotic heart disease of native coronary artery without angina pectoris: Secondary | ICD-10-CM

## 2012-05-03 DIAGNOSIS — R030 Elevated blood-pressure reading, without diagnosis of hypertension: Secondary | ICD-10-CM

## 2012-05-03 NOTE — Assessment & Plan Note (Signed)
Symptomatically stable on medical therapy. Continue regular exercise. No changes made to regimen. ECG is stable.

## 2012-05-03 NOTE — Patient Instructions (Signed)
Your physician wants you to follow-up in: 6 months. You will receive a reminder letter in the mail one-two months in advance. If you don't receive a letter, please call our office to schedule the follow-up appointment. Your physician recommends that you continue on your current medications as directed. Please refer to the Current Medication list given to you today. 

## 2012-05-03 NOTE — Progress Notes (Signed)
Clinical Summary [name] is a 78 y.o.male presenting for followup. He was seen in November 2012. He continues to exercise in the maintenance cardiac rehabilitation program, reports no angina or unusual breathlessness. He does use a cane intermittently to steady himself when walking outdoors. Also continues to walk his dog regularly.  He has not been able to tolerate statin therapy, not even our switch to low-dose simvastatin. On his own, he has started taking long-acting niacin 500 mg daily, reports that he is tolerating this. Lipids from earlier in the year per Dr. Sherryll Burger were still poorly controlled, total cholesterol 284, HDL 53, LDL 210.  Reports that her blood pressure control. Stable appetite, minor leg edema.   Allergies  Allergen Reactions  . Iodinated Diagnostic Agents     Current Outpatient Prescriptions  Medication Sig Dispense Refill  . aspirin 81 MG tablet Take 81 mg by mouth daily.       . cyclobenzaprine (FLEXERIL) 10 MG tablet Take 1 tablet by mouth at bedtime.      . diclofenac (VOLTAREN) 75 MG EC tablet Take 75 mg by mouth 2 (two) times daily.        Marland Kitchen levothyroxine (SYNTHROID, LEVOTHROID) 125 MCG tablet Take 125 mcg by mouth daily.        Marland Kitchen loratadine (CLARITIN) 10 MG tablet Take 10 mg by mouth daily as needed.       . niacin 500 MG tablet Take 500 mg by mouth at bedtime.      . nitroGLYCERIN (NITROSTAT) 0.4 MG SL tablet Place 0.4 mg under the tongue every 5 (five) minutes as needed.        Marland Kitchen omeprazole (PRILOSEC) 20 MG capsule Take 20 mg by mouth 2 (two) times daily.        Marland Kitchen oxybutynin (DITROPAN) 5 MG tablet Take 1 tablet by mouth Twice daily as needed.      . traMADol (ULTRAM) 50 MG tablet Take 50 mg by mouth every 4 (four) hours as needed. Maximum dose= 8 tablets per day         Past Medical History  Diagnosis Date  . Coronary atherosclerosis of native coronary artery     Two-vessel, LVEF 60%  . Myocardial infarction     1980, managed conservatively  .  Hypothyroidism   . GERD (gastroesophageal reflux disease)   . Herpes zoster   . Hx of colonic polyps   . Mixed hyperlipidemia   . Arthritis   . Rosacea   . Urge incontinence   . Allergic rhinitis   . Seborrheic dermatitis     Past Surgical History  Procedure Date  . Coronary artery bypass graft 12/11    Dr. Tyrone Sage - off pump LIMA to LAD, SVG to RCA  . Tonsillectomy   . Submandibular salivary gland resection     Social History Mr. Staszak reports that he quit smoking about 33 years ago. His smoking use included Cigarettes. He has a 30 pack-year smoking history. He has never used smokeless tobacco. Mr. Eddins reports that he drinks alcohol.  Review of Systems No palpitations or lightheadedness. No reported bleeding problems. No orthopnea or PND.  Physical Examination Filed Vitals:   05/03/12 0934  BP: 132/78  Pulse: 66    Overweight elderly male in no acute distress, without active symptoms.  HEENT: Conjunctiva and lids normal, oropharynx with moist mucosa.  Neck: Supple, no elevated JVP or carotid bruits, no thyromegaly.  Lungs: Decreased breath sounds, clear however, no labored breathing or  wheezing.  Cardiac: Regular rate and rhythm, no S3 gallop or rub.  Chest: Well healing midline sternal incision.  Abdomen: Soft, nontender, bowel sounds present, no bruits.  Extremities: No pitting edema, distal pulses one plus.    ECG Normal sinus rhythm with PACs and nonspecific T-wave changes.   Problem List and Plan   CORONARY ATHEROSCLEROSIS NATIVE CORONARY ARTERY Symptomatically stable on medical therapy. Continue regular exercise. No changes made to regimen. ECG is stable.  MIXED HYPERLIPIDEMIA LDL quite high as of January. He has unfortunately had statin intolerance, but willing to take long-acting niacin which he has done recently. Hopefully this will lead to some improvement.  ELEVATED BLOOD PRESSURE Continue to keep an eye on blood pressure.    Jonelle Sidle, M.D., F.A.C.C.

## 2012-05-03 NOTE — Assessment & Plan Note (Signed)
LDL quite high as of January. He has unfortunately had statin intolerance, but willing to take long-acting niacin which he has done recently. Hopefully this will lead to some improvement.

## 2012-05-03 NOTE — Assessment & Plan Note (Signed)
Continue to keep an eye on blood pressure.

## 2012-11-15 ENCOUNTER — Ambulatory Visit (INDEPENDENT_AMBULATORY_CARE_PROVIDER_SITE_OTHER): Payer: Medicare Other | Admitting: Cardiology

## 2012-11-15 ENCOUNTER — Encounter: Payer: Self-pay | Admitting: Cardiology

## 2012-11-15 VITALS — BP 145/98 | HR 79 | Ht 65.0 in | Wt 186.0 lb

## 2012-11-15 DIAGNOSIS — R0989 Other specified symptoms and signs involving the circulatory and respiratory systems: Secondary | ICD-10-CM | POA: Insufficient documentation

## 2012-11-15 DIAGNOSIS — I2510 Atherosclerotic heart disease of native coronary artery without angina pectoris: Secondary | ICD-10-CM

## 2012-11-15 DIAGNOSIS — E782 Mixed hyperlipidemia: Secondary | ICD-10-CM

## 2012-11-15 DIAGNOSIS — R030 Elevated blood-pressure reading, without diagnosis of hypertension: Secondary | ICD-10-CM

## 2012-11-15 NOTE — Progress Notes (Signed)
Clinical Summary Mr. Largo is a 78 y.o.male presenting for followup. He was seen in May. He continues to exercise at the cardiac rehabilitation program, also walks his dog daily. He reports no angina symptoms or progressive shortness of breath.  He continues to take over-the-counter niacin, did not tolerate statins previously. He has not had followup lab work as yet.  He denies any palpitations, no unusual bleeding episodes. No syncope.   Allergies  Allergen Reactions  . Iodinated Diagnostic Agents     Current Outpatient Prescriptions  Medication Sig Dispense Refill  . Artificial Tear (GENTEAL) GEL Apply 1 drop to eye as directed.      Marland Kitchen aspirin 81 MG tablet Take 81 mg by mouth daily.       . diclofenac (VOLTAREN) 75 MG EC tablet Take 75 mg by mouth 2 (two) times daily as needed.       Marland Kitchen levothyroxine (SYNTHROID, LEVOTHROID) 125 MCG tablet Take 125 mcg by mouth daily.        Marland Kitchen loratadine (CLARITIN) 10 MG tablet Take 10 mg by mouth daily as needed.       . niacin 500 MG tablet Take 500 mg by mouth at bedtime.      . nitroGLYCERIN (NITROSTAT) 0.4 MG SL tablet Place 0.4 mg under the tongue every 5 (five) minutes as needed.        Marland Kitchen omeprazole (PRILOSEC) 20 MG capsule Take 20 mg by mouth 2 (two) times daily as needed.       Marland Kitchen oxybutynin (DITROPAN) 5 MG tablet Take 1 tablet by mouth Twice daily as needed.      . traMADol (ULTRAM) 50 MG tablet Take 50 mg by mouth every 4 (four) hours as needed. Maximum dose= 8 tablets per day         Past Medical History  Diagnosis Date  . Coronary atherosclerosis of native coronary artery     Two-vessel, LVEF 60%  . Myocardial infarction     1980, managed conservatively  . Hypothyroidism   . GERD (gastroesophageal reflux disease)   . Herpes zoster   . Hx of colonic polyps   . Mixed hyperlipidemia   . Arthritis   . Rosacea   . Urge incontinence   . Allergic rhinitis   . Seborrheic dermatitis     Past Surgical History  Procedure Date  .  Coronary artery bypass graft 12/11    Dr. Tyrone Sage - off pump LIMA to LAD, SVG to RCA  . Tonsillectomy   . Submandibular salivary gland resection     Social History Mr. Brannan reports that he quit smoking about 33 years ago. His smoking use included Cigarettes. He has a 30 pack-year smoking history. He has never used smokeless tobacco. Mr. Nudd reports that he drinks alcohol.  Review of Systems States he had a fall after stepping off of a curb, no major injuries. Otherwise negative except as outlined.  Physical Examination Filed Vitals:   11/15/12 1048  BP: 145/98  Pulse: 79   Filed Weights   11/15/12 1048  Weight: 186 lb (84.369 kg)    No acute distress, without active symptoms.  HEENT: Conjunctiva and lids normal, oropharynx with moist mucosa.  Neck: Supple, no elevated JVP, left carotid bruit, no thyromegaly.  Lungs: Decreased breath sounds, clear however, no labored breathing or wheezing.  Cardiac: Regular rate and rhythm with occasional ectopic beat, no S3 gallop or rub.  Chest: Well healing midline sternal incision.  Abdomen: Soft, nontender, bowel sounds  present, no bruits.  Extremities: No pitting edema, distal pulses one plus.    Problem List and Plan   CORONARY ATHEROSCLEROSIS NATIVE CORONARY ARTERY Symptomatically stable status post CABG in December 2011. He continues to walk regularly for exercise.  Carotid bruit Asymptomatic, left-sided. Pre-CABG Doppler showed no significant obstructive ICA disease.  ELEVATED BLOOD PRESSURE He tells me that his typical blood pressure readings at home or post exercise show systolics in the 120 to 130 range. I asked him to continue to keep an eye on this. Blood pressure today was elevated.  MIXED HYPERLIPIDEMIA Has had statin intolerance. Has been able to take over-the-counter niacin. Followup FLP.    Jonelle Sidle, M.D., F.A.C.C.

## 2012-11-15 NOTE — Assessment & Plan Note (Signed)
Symptomatically stable status post CABG in December 2011. He continues to walk regularly for exercise.

## 2012-11-15 NOTE — Assessment & Plan Note (Signed)
Asymptomatic, left-sided. Pre-CABG Doppler showed no significant obstructive ICA disease.

## 2012-11-15 NOTE — Assessment & Plan Note (Signed)
Has had statin intolerance. Has been able to take over-the-counter niacin. Followup FLP.

## 2012-11-15 NOTE — Patient Instructions (Addendum)
Your physician recommends that you schedule a follow-up appointment in: 6 months. You will receive a reminder letter in the mail in about 4 months reminding you to call and schedule your appointment. If you don't receive this letter, please contact our office.  Your physician recommends that you continue on your current medications as directed. Please refer to the Current Medication list given to you today.  Your physician recommends that you return for a FASTING lipid profile: at [facility] next week is fine any day. Please don't eat or drink after midnight the night before except your medications with a sip of water that morning.

## 2012-11-15 NOTE — Assessment & Plan Note (Signed)
He tells me that his typical blood pressure readings at home or post exercise show systolics in the 120 to 130 range. I asked him to continue to keep an eye on this. Blood pressure today was elevated.

## 2012-11-21 ENCOUNTER — Telehealth: Payer: Self-pay | Admitting: *Deleted

## 2012-11-21 DIAGNOSIS — Z79899 Other long term (current) drug therapy: Secondary | ICD-10-CM

## 2012-11-21 DIAGNOSIS — E785 Hyperlipidemia, unspecified: Secondary | ICD-10-CM

## 2012-11-21 MED ORDER — NIACIN ER (ANTIHYPERLIPIDEMIC) 500 MG PO TBCR: 500.0000 mg | EXTENDED_RELEASE_TABLET | Freq: Every day | ORAL | Status: DC

## 2012-11-21 NOTE — Telephone Encounter (Signed)
Message copied by Eustace Moore on Tue Nov 21, 2012  2:13 PM ------      Message from: MCDOWELL, Illene Bolus      Created: Mon Nov 20, 2012  9:27 AM       Reviewed. Total cholesterol is 261 and LDL is 192. Would suggest that he stop the over-the-counter niacin, and we should begin a trial of Niaspan beginning at 500 mg daily for one month, and then increasing to 1000 mg daily if he tolerates the initiation dose. Suggest taking an aspirin 30 minutes before dose of Niaspan. Followup FLP and LFT in 12 weeks.

## 2012-11-21 NOTE — Telephone Encounter (Signed)
Patient informed and lab orders mailed to patient. New prescription sent to pharmacy.

## 2013-02-01 ENCOUNTER — Telehealth: Payer: Self-pay | Admitting: Cardiology

## 2013-02-01 NOTE — Telephone Encounter (Signed)
Left message to return call 

## 2013-02-01 NOTE — Telephone Encounter (Signed)
Suppose to have blood work done for [place] around the 18th. Has to also do lab work for Dr Sherryll Burger and would like to know if he can do both togethere

## 2013-02-02 NOTE — Telephone Encounter (Signed)
Advised okay, just make sure we get copy sent to [place].

## 2013-02-21 ENCOUNTER — Encounter: Payer: Self-pay | Admitting: *Deleted

## 2013-02-21 NOTE — PMR Pre-admission (Signed)
[employer] PMR Admission Coordinator Pre-Admission Assessment  Patient: [name] is an 79 y.o., male MRN: [id number] DOB: [birth year] Height: 5\' 8"  (172.7 cm)5'8" Weight: 79.8 kg (175 lb 14.8 oz)176  Insurance Information HMO: yes    PRIMARY: U[employer] Medicare      Policy#: [id number]      Subscriber: self  CM Name: [name]      Phone#: [phone]     Fax#: [phone] Pre-Cert#: [id number]      Employer: Retired  Benefits:  Phone #: [phone]     Name: [name]. Date: 12/27/12     Deduct: 0      Out of Pocket Max: $4900      Life Max: none CIR: $295 day 1-5 / $0 day 6+      SNF: $25 day 1-20 / $152 day 21-49 / $0 day 50-100 Outpatient:  No limit      Co-Pay: $45 Home Health: 100%      Co-Pay: 0 DME: 80/20%  Precert # [id number] Providers: in network   Emergency Contact Information Contact Information   Name Relation Home Work Mobile   [name] Son in [name] [phone]  [phone]   [facility] [name] Daughter in [place]   [phone]   [name] in [place]   [phone]      Current Medical History  Patient Admitting Diagnosis: Right CVA, seizure  History of Present Illness: 79 year-old gentleman who had sudden onset of numbness approximately 2 weeks ago and presented at the ED at [facility]. Was sent to higher-level of care at [facility] for 7 days where he underwent embolectomy (per RN report). Was discharged to [facility]. Seizure activity noted on 2/22 with increased weakness and lethargy and was sent to [facility] ED. Had a second seizure at hospital (per RN report). Pt has left side inattention, LLE & LUE strength 3/5,  impaired  balance and attention span. See Discharge Summary for further details.  Past Medical History  Past Medical History  Diagnosis Date  . Coronary atherosclerosis of native coronary artery     Two-vessel, LVEF 60%  . Myocardial infarction     1980, managed conservatively  . Hypothyroidism   . GERD  (gastroesophageal reflux disease)   . Herpes zoster   . Hx of colonic polyps   . Mixed hyperlipidemia   . Arthritis   . Rosacea   . Urge incontinence   . Allergic rhinitis   . Seborrheic dermatitis   Hx Prostate cancer CABG in 2011 at [facility] Salivary gland surgery in 2007  Family History  family history includes Coronary artery disease in an unspecified family member.  Prior Rehab/Hospitalizations: none   Current Medications See discharge summary  Patients Current Diet:  Regular  Precautions / Restrictions: Seizure precautionsPrecautions Precautions: None Restrictions Weight Bearing Restrictions: No   Prior Activity Level: Active daily and driving. Worked out daily. Volunteered at [employer]. Community (5-7x/wk): Very active daily Home Assistive Devices / Equipment     Prior Functional Level Current Functional Level  Bed Mobility  Independent  Mod assist   Transfers  Independent  Max assist   Mobility - Walk/Wheelchair  Independent  Max assist (+2, 8 ft)   Upper Body Dressing  Independent  Max assist   Lower Body Dressing  Independent  Total assist   Grooming  Independent  Mod assist   Eating/Drinking  Independent  Min assist   Toilet Transfer  Independent  Total assist   Bladder Continence  Continent  Foley in place   Bowel Management  Continent  Continent   Stair Climbing  Able to Take Stairs?: [employer]  Other (not tried)   Geologist, engineering  WDL   Memory  independent  WDL   Cooking/Meal Prep  independent      Housework  independent    Money Management  independent    Driving  [employer]     Previous Home Environment Living Arrangements: Alone Available Help at Discharge: Family Type of Home: House Home Layout: One level Home Access: Stairs to enter Entrance Stairs-Rails: Can reach both Bathroom Shower/Tub: Tub/shower unit;Walk-in shower Bathroom Toilet: Handicapped height Bathroom Accessibility:  Yes How Accessible: Accessible via walker Additional Comments: Home is accessible dut to pt's wife was disabled and he cared for her.  Discharge Living Setting Plans for Discharge Living Setting: Patient's home Type of Home at Discharge: House Discharge Home Layout: One level (Stairlift to basement.) Discharge Home Access: Stairlift Discharge Bathroom Shower/Tub: Tub/shower unit;Walk-in shower Discharge Bathroom Toilet: Handicapped height Discharge Bathroom Accessibility: Yes How Accessible: Accessible via walker Do you have any problems obtaining your medications?: No  Social/Family/Support Systems Patient Roles: Parent. Widowed. Very supportive family. Pt has a daughter & grndson in [place] & a son in [place]. Contact Information: [phone] Anticipated Caregiver: Daughter: [name] Anticipated Caregiver's Contact Information: [phone] Ability/Limitations of Caregiver: none Caregiver Availability: 24/7 (Daughter lives in [place] but can stay with dad as needed.) Discharge Plan Discussed with Primary Caregiver: Yes Is Caregiver In Agreement with Plan?: Yes Does Caregiver/Family have Issues with Lodging/Transportation while Pt is in Rehab?: No  Goals/Additional Needs Patient/Family Goal for Rehab: PT& OT: Supervision - Minimum assistance Expected length of stay: 2-3 weeks Cultural Considerations: none Dietary Needs:  Regular Equipment Needs: TBD Pt/Family Agrees to Admission and willing to participate: Yes Program Orientation Provided & Reviewed with Pt/Caregiver Including Roles  & Responsibilities: Yes  Patient Condition: Evaluated the patient, reviewed the medical record and shared information with the Rehab MD.  We feel that the patient is appropriate for inpatient rehab. Will admit to inpatient rehab today.  Preadmission Screen Completed By:  [name], 02/22/2013 8:48 AM ______________________________________________________________________   Discussed  status with Dr. Wynn Oconnor on 02/22/13 at [phone] and received telephone approval for admission today.  Admission Coordinator:  [name], time 0850/Date 02/22/13.   Assessment/Plan: Diagnosis:left hemiparesis due to right CVA 1. Does the need for close, 24 hr/day  Medical supervision in concert with the patient's rehab needs make it unreasonable for this patient to be served in a less intensive setting? Yes 2. Co-Morbidities requiring supervision/potential complications: coronary artery disease status post CABG 3. Due to bladder management, bowel management, safety, skin/wound care, disease management, medication administration, pain management and patient education, does the patient require 24 hr/day rehab nursing? Yes 4. Does the patient require coordinated care of a physician, rehab nurse, PT (1-2 hrs/day, 5 days/week), OT (1-2 hrs/day, 5 days/week) and SLP (0.5-1 hrs/day, 5 days/week) to address physical and functional deficits in the context of the above medical diagnosis(es)? Yes and speech may be just an evaluation Addressing deficits in the following areas: balance, endurance, locomotion, strength, transferring, bowel/bladder control, bathing, dressing, feeding, grooming, toileting and cognition 5. Can the patient actively participate in an intensive therapy program of at least 3 hrs of therapy 5 days a week? Yes 6. The potential for patient to make measurable gains while on inpatient rehab is good 7. Anticipated functional outcomes upon discharge from inpatients are supervision mobility PT,  supervision ADLs OT, without cognitionSLP 8. Estimated rehab length of stay to reach the above functional goals is: 2 weeks 9. Does the patient have adequate social supports to accommodate these discharge functional goals? Potentially 10. Anticipated D/C setting: Home 11. Anticipated post D/C treatments: HH therapy 12. Overall Rehab/Functional Prognosis: good    RECOMMENDATIONS: This  patient's condition is appropriate for continued rehabilitative care in the following setting: CIR Patient has agreed to participate in recommended program. Potentially Note that insurance prior authorization may be required for reimbursement for recommended care.  Comment:  [name] 02/22/2013

## 2013-02-22 ENCOUNTER — Encounter (HOSPITAL_COMMUNITY): Payer: Self-pay | Admitting: *Deleted

## 2013-02-22 ENCOUNTER — Inpatient Hospital Stay (HOSPITAL_COMMUNITY)
Admission: RE | Admit: 2013-02-22 | Discharge: 2013-03-26 | DRG: 945 | Disposition: A | Discharge status: Home Health | Payer: Medicare Other | Source: Other Acute Inpatient Hospital | Attending: Physical Medicine & Rehabilitation | Admitting: Physical Medicine & Rehabilitation

## 2013-02-22 ENCOUNTER — Inpatient Hospital Stay (HOSPITAL_COMMUNITY): Payer: Medicare Other

## 2013-02-22 ENCOUNTER — Other Ambulatory Visit: Payer: Self-pay | Admitting: Physician Assistant

## 2013-02-22 DIAGNOSIS — E782 Mixed hyperlipidemia: Secondary | ICD-10-CM | POA: Diagnosis not present

## 2013-02-22 DIAGNOSIS — E039 Hypothyroidism, unspecified: Secondary | ICD-10-CM | POA: Diagnosis not present

## 2013-02-22 DIAGNOSIS — F329 Major depressive disorder, single episode, unspecified: Secondary | ICD-10-CM

## 2013-02-22 DIAGNOSIS — Z79899 Other long term (current) drug therapy: Secondary | ICD-10-CM

## 2013-02-22 DIAGNOSIS — R030 Elevated blood-pressure reading, without diagnosis of hypertension: Secondary | ICD-10-CM | POA: Diagnosis present

## 2013-02-22 DIAGNOSIS — Z7901 Long term (current) use of anticoagulants: Secondary | ICD-10-CM

## 2013-02-22 DIAGNOSIS — G8190 Hemiplegia, unspecified affecting unspecified side: Secondary | ICD-10-CM | POA: Diagnosis not present

## 2013-02-22 DIAGNOSIS — E871 Hypo-osmolality and hyponatremia: Secondary | ICD-10-CM

## 2013-02-22 DIAGNOSIS — I639 Cerebral infarction, unspecified: Secondary | ICD-10-CM

## 2013-02-22 DIAGNOSIS — N390 Urinary tract infection, site not specified: Secondary | ICD-10-CM

## 2013-02-22 DIAGNOSIS — I6330 Cerebral infarction due to thrombosis of unspecified cerebral artery: Secondary | ICD-10-CM

## 2013-02-22 DIAGNOSIS — I824Y9 Acute embolism and thrombosis of unspecified deep veins of unspecified proximal lower extremity: Secondary | ICD-10-CM | POA: Diagnosis not present

## 2013-02-22 DIAGNOSIS — Z951 Presence of aortocoronary bypass graft: Secondary | ICD-10-CM | POA: Diagnosis not present

## 2013-02-22 DIAGNOSIS — I2510 Atherosclerotic heart disease of native coronary artery without angina pectoris: Secondary | ICD-10-CM | POA: Diagnosis not present

## 2013-02-22 DIAGNOSIS — G40909 Epilepsy, unspecified, not intractable, without status epilepticus: Secondary | ICD-10-CM

## 2013-02-22 DIAGNOSIS — M25562 Pain in left knee: Secondary | ICD-10-CM | POA: Diagnosis present

## 2013-02-22 DIAGNOSIS — M4802 Spinal stenosis, cervical region: Secondary | ICD-10-CM | POA: Diagnosis present

## 2013-02-22 DIAGNOSIS — N400 Enlarged prostate without lower urinary tract symptoms: Secondary | ICD-10-CM

## 2013-02-22 DIAGNOSIS — G8110 Spastic hemiplegia affecting unspecified side: Secondary | ICD-10-CM

## 2013-02-22 DIAGNOSIS — I252 Old myocardial infarction: Secondary | ICD-10-CM | POA: Diagnosis not present

## 2013-02-22 DIAGNOSIS — I10 Essential (primary) hypertension: Secondary | ICD-10-CM

## 2013-02-22 DIAGNOSIS — M1710 Unilateral primary osteoarthritis, unspecified knee: Secondary | ICD-10-CM | POA: Diagnosis not present

## 2013-02-22 DIAGNOSIS — Z8546 Personal history of malignant neoplasm of prostate: Secondary | ICD-10-CM

## 2013-02-22 DIAGNOSIS — Z5189 Encounter for other specified aftercare: Secondary | ICD-10-CM | POA: Diagnosis present

## 2013-02-22 DIAGNOSIS — K219 Gastro-esophageal reflux disease without esophagitis: Secondary | ICD-10-CM

## 2013-02-22 DIAGNOSIS — Z7982 Long term (current) use of aspirin: Secondary | ICD-10-CM

## 2013-02-22 DIAGNOSIS — G8929 Other chronic pain: Secondary | ICD-10-CM | POA: Diagnosis present

## 2013-02-22 DIAGNOSIS — Z87891 Personal history of nicotine dependence: Secondary | ICD-10-CM

## 2013-02-22 DIAGNOSIS — I6350 Cerebral infarction due to unspecified occlusion or stenosis of unspecified cerebral artery: Secondary | ICD-10-CM | POA: Diagnosis not present

## 2013-02-22 DIAGNOSIS — F3289 Other specified depressive episodes: Secondary | ICD-10-CM

## 2013-02-22 HISTORY — DX: Malignant neoplasm of prostate: C61

## 2013-02-22 LAB — MRSA PCR SCREENING: MRSA by PCR: NEGATIVE

## 2013-02-22 MED ORDER — ONDANSETRON HCL 4 MG PO TABS: 4.0000 mg | ORAL_TABLET | Freq: Four times a day (QID) | ORAL | Status: DC | PRN

## 2013-02-22 MED ORDER — ACETAMINOPHEN 325 MG PO TABS
325.0000 mg | ORAL_TABLET | ORAL | Status: DC | PRN
  Administered 2013-03-05 – 2013-03-11 (×4): 650 mg via ORAL
  Filled 2013-02-22 (×4): qty 2

## 2013-02-22 MED ORDER — TAMSULOSIN HCL 0.4 MG PO CAPS
0.4000 mg | ORAL_CAPSULE | Freq: Every day | ORAL | Status: DC
  Administered 2013-02-23 – 2013-03-26 (×32): 0.4 mg via ORAL
  Filled 2013-02-22 (×33): qty 1

## 2013-02-22 MED ORDER — TRAMADOL HCL 50 MG PO TABS
50.0000 mg | ORAL_TABLET | Freq: Four times a day (QID) | ORAL | Status: DC
  Administered 2013-02-22 – 2013-03-26 (×127): 50 mg via ORAL
  Filled 2013-02-22 (×128): qty 1

## 2013-02-22 MED ORDER — LEVOTHYROXINE SODIUM 125 MCG PO TABS
125.0000 ug | ORAL_TABLET | Freq: Every day | ORAL | Status: DC
  Administered 2013-02-23 – 2013-03-26 (×32): 125 ug via ORAL
  Filled 2013-02-22 (×33): qty 1

## 2013-02-22 MED ORDER — SORBITOL 70 % SOLN
30.0000 mL | Freq: Every day | Status: DC | PRN
  Administered 2013-03-22: 30 mL via ORAL
  Filled 2013-02-22 (×2): qty 30

## 2013-02-22 MED ORDER — ASPIRIN 325 MG PO TABS
325.0000 mg | ORAL_TABLET | Freq: Every day | ORAL | Status: DC
Start: 2013-02-23 — End: 2013-03-19
  Administered 2013-02-23 – 2013-03-18 (×24): 325 mg via ORAL
  Filled 2013-02-22 (×26): qty 1

## 2013-02-22 MED ORDER — PHENYTOIN SODIUM EXTENDED 100 MG PO CAPS
100.0000 mg | ORAL_CAPSULE | Freq: Three times a day (TID) | ORAL | Status: DC
  Administered 2013-02-22 – 2013-03-08 (×44): 100 mg via ORAL
  Filled 2013-02-22 (×48): qty 1

## 2013-02-22 MED ORDER — SERTRALINE HCL 50 MG PO TABS
50.0000 mg | ORAL_TABLET | Freq: Every day | ORAL | Status: DC
Start: 2013-02-23 — End: 2013-03-05
  Administered 2013-02-23 – 2013-03-04 (×9): 50 mg via ORAL
  Filled 2013-02-22 (×12): qty 1

## 2013-02-22 MED ORDER — PANTOPRAZOLE SODIUM 40 MG PO TBEC
40.0000 mg | DELAYED_RELEASE_TABLET | Freq: Every day | ORAL | Status: DC
  Administered 2013-02-23 – 2013-03-26 (×32): 40 mg via ORAL
  Filled 2013-02-22 (×4): qty 1
  Filled 2013-02-22: qty 12
  Filled 2013-02-22 (×25): qty 1

## 2013-02-22 MED ORDER — BICALUTAMIDE 50 MG PO TABS
50.0000 mg | ORAL_TABLET | Freq: Every day | ORAL | Status: DC
  Administered 2013-02-23 – 2013-03-26 (×32): 50 mg via ORAL
  Filled 2013-02-22 (×33): qty 1

## 2013-02-22 MED ORDER — ONDANSETRON HCL 4 MG/2ML IJ SOLN: 4.0000 mg | Freq: Four times a day (QID) | INTRAMUSCULAR | Status: DC | PRN

## 2013-02-22 MED ORDER — CLOPIDOGREL BISULFATE 75 MG PO TABS
75.0000 mg | ORAL_TABLET | Freq: Every day | ORAL | Status: DC
Start: 2013-02-23 — End: 2013-03-12
  Administered 2013-02-23 – 2013-03-12 (×18): 75 mg via ORAL
  Filled 2013-02-22 (×19): qty 1

## 2013-02-22 MED ORDER — HYDROCODONE-ACETAMINOPHEN 5-325 MG PO TABS: 1.0000 | ORAL_TABLET | Freq: Four times a day (QID) | ORAL | Status: DC | PRN

## 2013-02-22 NOTE — H&P (Signed)
Chief complaint: Left-sided weakness :  HPI: 79 year-old male with history of CAD, CABG who was recently evaluated in the emergency room 2 weeks ago at [facility] with weakness of his left arm and left leg noted to have acute infarct. By report he was subsequently transferred to [facility] after evaluation Plavix was added to aspirin. He did not receive TPA per report. Patient noted to have seizure and was loaded with intravenous Dilantin. Paperwork was very minimal due to workup being done at [facility]. He was later transferred back to [facility] for ongoing physical therapy and left-sided weakness secondary to stroke.  The patient has knee pain and takes tramadol at home for this. The patient has a history of neck pain but not currently complaining. MRI in 2008 demonstrating multilevel cervical stenosis C3-C7 levels Review of Systems  Genitourinary: Positive for urgency.  Neurological: Positive for seizures and weakness.  Psychiatric/Behavioral: Positive for depression.  All other systems reviewed and are negative.   Past Medical History   Diagnosis  Date   .  Coronary atherosclerosis of native coronary artery      Two-vessel, LVEF 60%   .  Myocardial infarction      1980, managed conservatively   .  Hypothyroidism    .  GERD (gastroesophageal reflux disease)    .  Herpes zoster    .  Hx of colonic polyps    .  Mixed hyperlipidemia    .  Arthritis    .  Rosacea    .  Urge incontinence    .  Allergic rhinitis    .  Seborrheic dermatitis     Past Surgical History   Procedure  Laterality  Date   .  Coronary artery bypass graft   12/11     Dr. Tyrone Sage - off pump LIMA to LAD, SVG to RCA   .  Tonsillectomy     .  Submandibular salivary gland resection      Family History   Problem  Relation  Age of Onset   .  Coronary artery disease      Social History: reports that he quit smoking about 34 years ago. His smoking use included Cigarettes. He has a  30 pack-year smoking history. He has never used smokeless tobacco. He reports that drinks alcohol. He reports that he does not use illicit drugs.  Allergies:  Allergies   Allergen  Reactions   .  Iodinated Diagnostic Agents      (Not in a hospital admission)  Home:  patient is widowed and lives alone. He has a daughter who can assist. Patient is retired. One level home with a chair lift as his wife was disabled.  Functional History:  independent prior to admission  Functional Status:  Mobility:  patient requires constant moderate assist and constant verbal cues to achieve static standing posture. He is max assist to ambulate with a walker.     ADL:  Max assist for most activities daily living as previously noted  Cognition:  patient does need some ongoing cues to maintain tasks   Physical Exam:  There were no vitals taken for this visit.  128/70 P80 RR18 T98.6  Physical Exam  Vitals reviewed.  Constitutional: He is oriented to person, place, and time. He appears well-developed.  HENT:  Head: Normocephalic.  Eyes: EOM are normal.  Neck: Neck supple. No thyromegaly present.  Cardiovascular: Normal rate and regular rhythm.  Pulmonary/Chest: Effort normal and breath sounds normal.  No respiratory distress.  Abdominal: Soft. Bowel sounds are normal. He exhibits no distension.  Musculoskeletal: He exhibits no edema.  Neurological: He is alert and oriented to person, place, and time.  Follows three step commands  Motor strength is 5/5 in the right deltoid, biceps, triceps, grip, hip flexor, knee extensors, ankle dorsiflexor and plantar flexor Motor strength is 3 minus/5 in the left deltoid, biceps, triceps, grip, hip flexor, 4 minus in the knee extensor, 3 minus in ankle dorsiflexor and plantar flexor Sensation is intact to light touch There is evidence of tactile extinction on double simultaneous stimulation There is evidence of left-sided neglect on visual confrontation  testing There is a right gaze preference Verbal output is sparse but no evidence of dysarthria Patient does have decreased sensation to light touch in the left. Midline chest incision well healed  No results found for this or any previous visit (from the past 48 hour(s)).  No results found.  Post Admission Physician Evaluation:  1. Functional deficits secondary to Right MCA distribution infarct with left hemiparesis, left neglect. 2. Patient is admitted to receive collaborative, interdisciplinary care between the physiatrist, rehab nursing staff, and therapy team. 3. Patient's level of medical complexity and substantial therapy needs in context of that medical necessity cannot be provided at a lesser intensity of care such as a SNF. 4. Patient has experienced substantial functional loss from his/her baseline which was documented above under the "Functional History" and "Functional Status" headings. Judging by the patient's diagnosis, physical exam, and functional history, the patient has potential for functional progress which will result in measurable gains while on inpatient rehab. These gains will be of substantial and practical use upon discharge in facilitating mobility and self-care at the household level. 5. Physiatrist will provide 24 hour management of medical needs as well as oversight of the therapy plan/treatment and provide guidance as appropriate regarding the interaction of the two. 6. 24 hour rehab nursing will assist with bladder management, bowel management, safety, skin/wound care, disease management, medication administration, pain management and patient education and help integrate therapy concepts, techniques,education, etc. 7. PT will assess and treat for/with: Pre-gait training, gait training, endurance, safety, equipment, neuromuscular reeducation. Goals are: Supervision level for all mobility. 8. OT will assess and treat for/with: ADLs, cognitive perceptual skills,  neuromuscular reeducation, safety, equipment, endurance. Goals are: Supervision level for lower body ADLs and upper body ADLs. 9. SLP will assess and treat for/with: Improved awareness of left side for visual tasks. Goals are: Supervision to modified independent with home medication. 10. Case Management and Social Worker will assess and treat for psychological issues and discharge planning. 11. Team conference will be held weekly to assess progress toward goals and to determine barriers to discharge. 12. Patient will receive at least 3 hours of therapy per day at least 5 days per week. 13. ELOS: 2 weeks Prognosis: excellent Medical Problem List and Plan:  1. Recent CVA with left-sided weakness. Will attempt to obtain workup while at [facility]  2. DVT Prophylaxis/Anticoagulation: SCDs. Monitor for any signs of DVT  3. Mood: Zoloft 50 mg daily. Provide emotional support and positive reinforcement  4. Neuropsych: This patient Is capable of making decisions on his/her own behalf.  5. Pain management. Vicodin as needed for headaches  6. Seizure disorder. Dilantin 100 mg 3 times daily. Check Dilantin level and monitor for signs of seizure  7. Hypothyroidism. Synthroid 125 mcg daily. We'll check old records for thyroid studies  8. BPH history of prostate  cancer. Flomax/Casodex. Check PVRs x3  9. Cervical spinal stenosis without evidence of radiculopathy 10. Bilateral knee pain suspect osteoarthritis will check x-rays, continue tramadol for pain

## 2013-02-23 ENCOUNTER — Inpatient Hospital Stay (HOSPITAL_COMMUNITY): Payer: Medicare Other | Admitting: Physical Therapy

## 2013-02-23 ENCOUNTER — Inpatient Hospital Stay (HOSPITAL_COMMUNITY): Payer: Medicare Other | Admitting: Speech Pathology

## 2013-02-23 ENCOUNTER — Inpatient Hospital Stay (HOSPITAL_COMMUNITY): Payer: Medicare Other | Admitting: *Deleted

## 2013-02-23 DIAGNOSIS — I6330 Cerebral infarction due to thrombosis of unspecified cerebral artery: Secondary | ICD-10-CM

## 2013-02-23 DIAGNOSIS — G8110 Spastic hemiplegia affecting unspecified side: Secondary | ICD-10-CM

## 2013-02-23 LAB — CBC WITH DIFFERENTIAL/PLATELET
Basophils Absolute: 0 10*3/uL (ref 0.0–0.1)
Eosinophils Relative: 1 % (ref 0–5)
Lymphocytes Relative: 23 % (ref 12–46)
Neutro Abs: 6.8 10*3/uL (ref 1.7–7.7)
Neutrophils Relative %: 68 % (ref 43–77)
Platelets: 258 10*3/uL (ref 150–400)
RBC: 3.44 MIL/uL — ABNORMAL LOW (ref 4.22–5.81)
RDW: 15.3 % (ref 11.5–15.5)
WBC: 10 10*3/uL (ref 4.0–10.5)

## 2013-02-23 LAB — COMPREHENSIVE METABOLIC PANEL
ALT: 21 U/L (ref 0–53)
AST: 22 U/L (ref 0–37)
Alkaline Phosphatase: 91 U/L (ref 39–117)
CO2: 24 mEq/L (ref 19–32)
Calcium: 8.9 mg/dL (ref 8.4–10.5)
Chloride: 93 mEq/L — ABNORMAL LOW (ref 96–112)
GFR calc non Af Amer: >90 mL/min (ref >90)
Potassium: 3.3 mEq/L — ABNORMAL LOW (ref 3.5–5.1)
Sodium: 128 mEq/L — ABNORMAL LOW (ref 135–145)

## 2013-02-23 MED ORDER — POTASSIUM CHLORIDE CRYS ER 20 MEQ PO TBCR
20.0000 meq | EXTENDED_RELEASE_TABLET | Freq: Two times a day (BID) | ORAL | Status: AC
  Administered 2013-02-23 – 2013-02-25 (×4): 20 meq via ORAL
  Filled 2013-02-23 (×4): qty 1

## 2013-02-23 NOTE — Evaluation (Signed)
Physical Therapy Assessment and Plan  Patient Details  Name: [name] MRN: [id number] Date of Birth: 10/02/1931  PT Diagnosis: Abnormal posture, Abnormality of gait, Cognitive deficits, Coordination disorder, Difficulty walking, Hemiparesis non-dominant, Impaired cognition, Impaired sensation and Muscle weakness Rehab Potential: Good ELOS: 3- 4 weeks   Today's Date: 02/23/2013 Time: 5284-1324 Time Calculation (min): 65 min  Problem List:  Patient Active Problem List  Diagnosis  . MIXED HYPERLIPIDEMIA  . ELEVATED BLOOD PRESSURE  . CORONARY ATHEROSCLEROSIS NATIVE CORONARY ARTERY  . Carotid bruit  . CVA (cerebral infarction)  . Spinal stenosis in cervical region  . Bilateral chronic knee pain    Past Medical History:  Past Medical History  Diagnosis Date  . Coronary atherosclerosis of native coronary artery     Two-vessel, LVEF 60%  . Myocardial infarction     1980, managed conservatively  . Hypothyroidism   . GERD (gastroesophageal reflux disease)   . Herpes zoster   . Hx of colonic polyps   . Mixed hyperlipidemia   . Arthritis   . Rosacea   . Urge incontinence   . Allergic rhinitis   . Seborrheic dermatitis    Past Surgical History:  Past Surgical History  Procedure Laterality Date  . Coronary artery bypass graft  12/11    Dr. Tyrone Sage - off pump LIMA to LAD, SVG to RCA  . Tonsillectomy    . Submandibular salivary gland resection      Assessment & Plan Clinical Impression:79 year-old gentleman who had sudden onset of numbness approximately 2 weeks ago and presented at the ED at [facility]. Was sent to higher-level of care at [facility] for 7 days where he underwent embolectomy (per RN report). Was discharged to [facility]. Seizure activity noted on 2/22 with increased weakness and lethargy and was sent to [facility] ED. Had a second seizure at hospital (per RN report). Pt has left side inattention, LLE & LUE strength 3/5, impaired balance and attention span.  Patient transferred to CIR on 02/22/2013 .   Patient currently requires total assist +2 with mobility secondary to muscle weakness and muscle paralysis, impaired timing and sequencing, abnormal tone, unbalanced muscle activation, motor apraxia, decreased coordination and decreased motor planning, decreased initiation, decreased attention, decreased awareness, decreased problem solving, decreased safety awareness, decreased memory and delayed processing and decreased sitting balance, decreased standing balance, decreased postural control, decreased balance strategies and hemiparesis. Pt also demonstrates Lt. Inattention, impaired subjective vertical (Lt. Lean in sitting, standing) which impair his functional mobility. Prior to hospitalization, patient was independent  with mobility and lived with Alone in a House home.  Home access is FlightStairs to enter;Other (comment) (Has stair lift.).  Patient will benefit from skilled PT intervention to maximize safe functional mobility, minimize fall risk and decrease caregiver burden for planned discharge home with 24 hour assist.  Anticipate patient will benefit from follow up [facility] at discharge.  PT - End of Session Endurance Deficit: Yes PT Assessment Rehab Potential: Good PT Plan PT Intensity: Minimum of 1-2 x/day ,45 to 90 minutes PT Frequency: 5 out of 7 days PT Duration Estimated Length of Stay: 4 weeks PT Treatment/Interventions: Ambulation/gait training;Balance/vestibular training;Cognitive remediation/compensation;Community reintegration;Discharge planning;Neuromuscular re-education;Functional mobility training;Functional electrical stimulation;DME/adaptive equipment instruction;Patient/family education;Psychosocial support;Skin care/wound Engineer, manufacturing systems;Therapeutic Activities;Therapeutic Exercise;UE/LE Strength taining/ROM;UE/LE Coordination activities;Wheelchair propulsion/positioning PT Recommendation Recommendations for Other Services:  Neuropsych consult (pt appears to be "down and out") Follow Up Recommendations: Skilled nursing facility;Home health PT (pending progress) Patient destination: Skilled Nursing Facility (SNF) (pending progress) Equipment Recommended:  (  to be determined)   PT Evaluation Precautions/Restrictions Precautions Precautions: Fall Precaution Comments: Lt. hemiparesis Restrictions Weight Bearing Restrictions: No General   Vital SignsTherapy Vitals Temp: 98.5 F (36.9 C) Temp src: Oral Pulse Rate: 67 Resp: 18 BP: 145/75 mmHg Patient Position, if appropriate: Lying Oxygen Therapy SpO2: 96 % O2 Device: None (Room air) Pain Pain Assessment Pain Assessment: No/denies pain Home Living/Prior Functioning Home Living Lives With: Alone Available Help at Discharge: Family (intermittently) Type of Home: House Home Access: Stairs to enter;Other (comment) (Has stair lift.) Entrance Stairs-Number of Steps: Flight Entrance Stairs-Rails: Can reach both Home Layout: One level Bathroom Shower/Tub: Tub/shower unit;Walk-in shower Bathroom Toilet: Handicapped height Bathroom Accessibility: Yes How Accessible: Accessible via walker Additional Comments: Home is accessible d/t pt's wife was disabled and he cared for her. Prior Function Level of Independence: Independent with basic ADLs;Independent with homemaking with ambulation Able to Take Stairs?: Yes Driving: Yes Vocation: Retired Leisure: Hobbies-yes (Comment) Comments: Loves model trains, reading   Cognition Arousal/Alertness: Awake/alert Orientation Level: Oriented X4 Attention: Sustained Sustained Attention: Impaired Sustained Attention Impairment: Functional basic Memory: Impaired Memory Impairment: Decreased recall of new information Problem Solving: Impaired Problem Solving Impairment: Functional basic Safety/Judgment: Impaired Sensation Sensation Light Touch: Impaired by gross assessment;Impaired Detail Light Touch Impaired  Details: Impaired LLE;Impaired LUE;Impaired RLE Proprioception: Impaired Detail Proprioception Impaired Details: Impaired RLE;Impaired LLE Coordination Gross Motor Movements are Fluid and Coordinated: No Fine Motor Movements are Fluid and Coordinated: No Motor  Motor Motor - Skilled Clinical Observations: Lt. hemiparesis, motor apraxia  Mobility Bed Mobility Bed Mobility: Supine to Sit;Sit to Supine Supine to Sit: 1: +1 Total assist Supine to Sit Details: Tactile cues for initiation;Tactile cues for sequencing;Tactile cues for weight shifting;Verbal cues for technique;Verbal cues for sequencing;Visual cues/gestures for sequencing Supine to Sit Details (indicate cue type and reason): Decreased ability to initiate, complete transition. Poor trunk control during transition Sit to Supine: 1: +1 Total assist Transfers Sit to Stand: 1: +2 Total assist Sit to Stand Details: Tactile cues for initiation;Tactile cues for weight beaing;Manual facilitation for placement;Manual facilitation for weight shifting;Verbal cues for sequencing;Verbal cues for technique;Visual cues for safe use of DME/AE Stand to Sit: 1: +2 Total assist Stand to Sit Details (indicate cue type and reason): Manual facilitation for weight shifting;Verbal cues for technique;Visual cues for safe use of DME/AE;Verbal cues for sequencing;Verbal cues for safe use of DME/AE Stand Pivot Transfers: 1: +2 Total assist Stand Pivot Transfer Details: Tactile cues for initiation;Manual facilitation for weight shifting;Verbal cues for technique;Visual cues for safe use of DME/AE;Verbal cues for sequencing Stand Pivot Transfer Details (indicate cue type and reason): Step-pivot, decreased ability to step secondary to decreased ability to weight shift. Decreased ability to maintain full upright standing Lateral/Scoot Transfers: 1: +1 Total assist Lateral/Scoot Transfer Details: Tactile cues for initiation;Tactile cues for sequencing;Tactile cues  for weight shifting;Visual cues/gestures for sequencing;Verbal cues for sequencing;Verbal cues for technique;Verbal cues for precautions/safety;Manual facilitation for weight shifting;Verbal cues for safe use of DME/AE Locomotion  Ambulation Ambulation: No (Unsafe to attempt this morning given weakness) Wheelchair Mobility Wheelchair Mobility: Yes Wheelchair Assistance: 3: Mod assist Wheelchair Assistance Details: Verbal cues for sequencing;Verbal cues for technique Wheelchair Propulsion: Right upper extremity;Both lower extermities Wheelchair Parts Management: Needs assistance Distance: 40'  Trunk/Postural Assessment  Cervical Assessment Cervical Assessment: Exceptions to [facility] Thoracic Assessment Thoracic Assessment: Exceptions to [facility] Postural Control Postural Control: Deficits on evaluation Trunk Control: Posterior Lt. lean in sitting, able to correct for ~15-20 sec at a time.  Righting Reactions:  Evident most during transitions Postural Limitations: posterior pelvic tilt in sitting  Balance Balance Balance Assessed: Yes Static Sitting Balance Static Sitting - Balance Support: Right upper extremity supported Static Sitting - Level of Assistance: 4: Min assist Static Sitting - Comment/# of Minutes: Tactile cues and manual facilitation needed to maintain upright sitting, tends to lean posteriorly and to the Lt.  Static Standing Balance Static Standing - Balance Support: No upper extremity supported Static Standing - Level of Assistance: 1: +2 Total assist Static Standing - Comment/# of Minutes: Decreased total hip/knee extension to reach full stand, poor balance reactions. Extremity Assessment      RLE Assessment RLE Assessment: Exceptions to [facility] RLE Strength Right Hip Flexion: 3+/5 Right Knee Flexion: 3+/5 Right Knee Extension: 3+/5 Right Ankle Dorsiflexion: 3+/5 Right Ankle Plantar Flexion: 3+/5 LLE Assessment LLE Assessment: Exceptions to [facility] LLE Strength Left Hip  Flexion: 2+/5 Left Knee Flexion: 2+/5 Left Knee Extension: 3-/5 Left Ankle Dorsiflexion: 2+/5 Left Ankle Plantar Flexion: 3-/5  FIM:  FIM - Locomotion: Wheelchair Distance: 30'   Refer to Care Plan for Long Term Goals  Recommendations for other services: None   Skilled Therapeutic Interventions/Progress Updates:  Session focused on transfer training bed>wheelchair>drop arm chair. PT facilitating weight shifting, pt with motor apraxia and Lt. Inattention making transfers very difficult at this time. Pt also working on wheelchair mobility focusing on attention to Lt. UE/LE and avoiding obstacles. PT to facilitate use of Lt. LE to assist with propulsion and steering.   Discharge Criteria: Patient will be discharged from PT if patient refuses treatment 3 consecutive times without medical reason, if treatment goals not met, if there is a change in medical status, if patient makes no progress towards goals or if patient is discharged from hospital.  The above assessment, treatment plan, treatment alternatives and goals were discussed and mutually agreed upon: by patient  [name] 02/23/2013, 9:01 AM

## 2013-02-23 NOTE — Evaluation (Signed)
Speech Language Pathology Assessment and Plan  Patient Details  Name: [name] MRN: [id number] Date of Birth: Jun 25, 1931  SLP Diagnosis: Cognitive Impairments  Rehab Potential: Good ELOS: 3.5 weeks   Today's Date: 02/23/2013 Time: 1340-1430 Time Calculation (min): 50 min  Problem List:  Patient Active Problem List  Diagnosis  . MIXED HYPERLIPIDEMIA  . ELEVATED BLOOD PRESSURE  . CORONARY ATHEROSCLEROSIS NATIVE CORONARY ARTERY  . Carotid bruit  . CVA (cerebral infarction)  . Spinal stenosis in cervical region  . Bilateral chronic knee pain   Past Medical History:  Past Medical History  Diagnosis Date  . Coronary atherosclerosis of native coronary artery     Two-vessel, LVEF 60%  . Myocardial infarction     1980, managed conservatively  . Hypothyroidism   . GERD (gastroesophageal reflux disease)   . Herpes zoster   . Hx of colonic polyps   . Mixed hyperlipidemia   . Arthritis   . Rosacea   . Urge incontinence   . Allergic rhinitis   . Seborrheic dermatitis    Past Surgical History:  Past Surgical History  Procedure Laterality Date  . Coronary artery bypass graft  12/11    Dr. Tyrone Sage - off pump LIMA to LAD, SVG to RCA  . Tonsillectomy    . Submandibular salivary gland resection      Assessment / Plan / Recommendation Clinical Impression  79 year-old male with history of CAD, CABG who was recently evaluated in the emergency room 2 weeks ago at [facility] with weakness of his left arm and left leg noted to have acute infarct. By report he was subsequently transferred to [facility] after evaluation Plavix was added to aspirin. He did not receive TPA per report. Patient noted to have seizure and was loaded with intravenous Dilantin. Paperwork was very minimal due to workup being done at [facility]. He was later transferred back to [facility] for ongoing physical therapy and left-sided weakness secondary to stroke. Patient admitted  to [facility] 02/22/13.  Cognitive-linguistic evaluation revealed decreased sustained attention to task, which impacts recall of information.  Patient also demonstrates decreased problem solving and left attention during performance of basic daily tasks.  At baseline patient was living independently; as a result, it is recommended that this patient receive skilled SLP services to address these cognitive deficits that impact safety with basic ADLs.  Skilled therapy will reduce burden of care prior to discharge home with caregiver.    SLP initiated treatment with min assist verbal cues to demonstrate basic problem solving while utilizing menu to place dinner order.     SLP Assessment  Patient will need skilled Speech Lanaguage Pathology Services during CIR admission    Recommendations  Recommendations for Other Services: Neuropsych consult Patient destination: Home Follow up Recommendations: Outpatient SLP;24 hour supervision/assistance Equipment Recommended: None recommended by SLP    SLP Frequency 5 out of 7 days   SLP Treatment/Interventions Cognitive remediation/compensation;Cueing hierarchy;Environmental controls;Functional tasks;Internal/external aids;Medication managment;Patient/family education;Therapeutic Activities;Therapeutic Exercise    Pain Pain Assessment Pain Assessment: No/denies pain Prior Functioning Cognitive/Linguistic Baseline: Within functional limits  Short Term Goals: Week 1: SLP Short Term Goal 1 (Week 1): Patient will attent to left environment during reading and writing task with supervision level verbal cues. SLP Short Term Goal 2 (Week 1): Patient will attend to and utilize left upper extremity during functional tasks with min assist question cues. SLP Short Term Goal 3 (Week 1): Patient will utilize external aids to recall new  information with min assist question cues. SLP Short Term Goal 4 (Week 1): Patient will solve moderately complex  problems with min assist verbal cues.   SLP Short Term Goal 5 (Week 1): Patient will sustain attention to functional task for 30 miutes with min assist vebral cues for re-direction.  See FIM for current functional status Refer to Care Plan for Long Term Goals  Recommendations for other services: None  Discharge Criteria: Patient will be discharged from SLP if patient refuses treatment 3 consecutive times without medical reason, if treatment goals not met, if there is a change in medical status, if patient makes no progress towards goals or if patient is discharged from hospital.  The above assessment, treatment plan, treatment alternatives and goals were discussed and mutually agreed upon: by patient  Fae Pippin, M.A., CCC-SLP [phone]  [name] 02/23/2013, 3:02 PM

## 2013-02-23 NOTE — Progress Notes (Signed)
Patient information reviewed and entered into eRehab system by Joanie Duprey, RN, CRRN, PPS Coordinator.  Information including medical coding and functional independence measure will be reviewed and updated through discharge.     Per nursing patient was given "Data Collection Information Summary for Patients in Inpatient Rehabilitation Facilities with attached "Privacy Act Statement-Health Care Records" upon admission.  

## 2013-02-23 NOTE — Evaluation (Signed)
Occupational Therapy Assessment and Plan  Patient Details  Name: [name] MRN: [id number] Date of Birth: Feb 10, 1974  OT Diagnosis: hemiplegia affecting non-dominant side Rehab Potential: Rehab Potential: Good ELOS: 3-4 weeks   Today's Date: 02/23/2013 Time: 0900- 1015  (75 min)  Time Calculation (min): 75 min  Problem List:  Patient Active Problem List  Diagnosis  . MIXED HYPERLIPIDEMIA  . ELEVATED BLOOD PRESSURE  . CORONARY ATHEROSCLEROSIS NATIVE CORONARY ARTERY  . Carotid bruit  . CVA (cerebral infarction)  . Spinal stenosis in cervical region  . Bilateral chronic knee pain    Past Medical History:  Past Medical History  Diagnosis Date  . Coronary atherosclerosis of native coronary artery     Two-vessel, LVEF 60%  . Myocardial infarction     1980, managed conservatively  . Hypothyroidism   . GERD (gastroesophageal reflux disease)   . Herpes zoster   . Hx of colonic polyps   . Mixed hyperlipidemia   . Arthritis   . Rosacea   . Urge incontinence   . Allergic rhinitis   . Seborrheic dermatitis    Past Surgical History:  Past Surgical History  Procedure Laterality Date  . Coronary artery bypass graft  12/11    Dr. Tyrone Sage - off pump LIMA to LAD, SVG to RCA  . Tonsillectomy    . Submandibular salivary gland resection      Assessment & Plan Clinical Impression:  79 year-old male with history of CAD, CABG who was recently evaluated in the emergency room 2 weeks ago at [facility] with weakness of his left arm and left leg noted to have acute infarct. By report he was subsequently transferred to [facility] after evaluation Plavix was added to aspirin. He did not receive TPA per report. Patient noted to have seizure and was loaded with intravenous Dilantin. Paperwork was very minimal due to workup being done at [facility]. He was later transferred back to [facility] for ongoing physical therapy and left-sided weakness secondary to  stroke.  The patient has knee pain and takes tramadol at home for this.  The patient has a history of neck pain but not currently complaining. MRI in 2008 demonstrating multilevel cervical stenosis C3-C7 levels  Patient transferred to CIR on 02/22/2013 .    Patient currently requires total with basic self-care skills secondary to unbalanced muscle activation, decreased coordination and decreased motor planning.  Prior to hospitalization, patient could complete BADL with independent .  Patient will benefit from skilled intervention to increase independence with basic self-care skills prior to discharge home with care partner.  Anticipate patient will require 24 hour supervision and follow up home health.  OT - End of Session Activity Tolerance: Tolerates 10 - 20 min activity with multiple rests Endurance Deficit: Yes OT Assessment Rehab Potential: Good Barriers to Discharge: None (will get private sitter) OT Plan OT Intensity: Minimum of 1-2 x/day, 45 to 90 minutes OT Frequency: 5 out of 7 days OT Duration/Estimated Length of Stay: 2-3 weeks OT Treatment/Interventions: Balance/vestibular training;Discharge planning;DME/adaptive equipment instruction;Functional mobility training;Neuromuscular re-education;Patient/family education;Psychosocial support;Self Care/advanced ADL retraining;Therapeutic Activities;Therapeutic Exercise;UE/LE Strength taining/ROM;UE/LE Coordination activities;Wheelchair propulsion/positioning   Skilled Therapeutic Intervention   OT Evaluation Precautions/Restrictions  Precautions Precautions: Fall Precaution Comments: Lt. hemiparesis Restrictions Weight Bearing Restrictions: No General   Vital Signs   Pain Pain Assessment Pain Assessment: No/denies pain Home Living/Prior Functioning Home Living Lives With: Alone Available Help at Discharge: Family Type of Home: House Home Access: Other (comment) (stair lifter to enter  home) Entrance Stairs-Number of  Steps: Flight Entrance Stairs-Rails: Can reach both Home Layout: One level Bathroom Shower/Tub: Tub/shower unit;Walk-in shower Bathroom Toilet: Handicapped height Bathroom Accessibility: Yes How Accessible: Accessible via walker Additional Comments: Home is accessible d/t pt's wife was disabled and he cared for her. IADL History Homemaking Responsibilities: Yes Meal Prep Responsibility: Primary Laundry Responsibility: Primary Cleaning Responsibility: Secondary Bill Paying/Finance Responsibility: Primary Shopping Responsibility: Primary Homemaking Comments:  (has housekeeper 2x week) Current License: Yes Mode of Transportation: Car Leisure and Hobbies:  (works on Administrator, [employer]) Prior Function Level of Independence: Independent with basic ADLs;Independent with homemaking with ambulation Able to Take Stairs?: Yes Driving: Yes Vocation: Retired Leisure: Hobbies-yes (Comment) Comments: computer class at [employer]; works on model trains ADL ADL Where Assessed-Grooming: Sitting at sink;Wheelchair Upper Body Bathing: Moderate assistance Where Assessed-Upper Body Bathing: Sitting at sink Lower Body Bathing: Maximal assistance Where Assessed-Lower Body Bathing: Sitting at sink Upper Body Dressing: Maximal assistance Where Assessed-Upper Body Dressing: Sitting at sink Lower Body Dressing: Dependent Where Assessed-Lower Body Dressing: Sitting at sink Toileting: Dependent Where Assessed-Toileting: Bedside Commode Toilet Transfer: Maximal assistance Toilet Transfer Method: Squat pivot Toilet Transfer Equipment: Bedside commode Tub/Shower Transfer: Not assessed Vision/Perception  Vision - History Baseline Vision: Bifocals Visual History: Cataracts Patient Visual Report: No change from baseline Vision - Assessment Eye Alignment: Within Functional Limits Vision Assessment: Vision not tested Perception Perception: Impaired Inattention/Neglect: Does not attend to left side of  body Praxis Praxis: Impaired Praxis Impairment Details: Motor planning  Cognition Overall Cognitive Status: Appears within functional limits for tasks assessed Arousal/Alertness: Awake/alert Orientation Level: Oriented X4 Attention: Sustained Sustained Attention: Appears intact Sustained Attention Impairment: Functional basic Memory: Appears intact Memory Impairment: Decreased recall of new information Awareness: Impaired Awareness Impairment: Intellectual impairment Problem Solving: Impaired Problem Solving Impairment: Functional basic Executive Function: Self Correcting;Self Monitoring;Initiating;Decision Making;Organizing;Sequencing Sequencing: Impaired Sequencing Impairment: Verbal basic;Functional basic Organizing: Impaired Organizing Impairment: Verbal basic;Functional basic Safety/Judgment: Impaired Sensation Sensation Light Touch: Impaired by gross assessment Light Touch Impaired Details: Impaired LUE;Absent LUE Stereognosis: Impaired Detail Proprioception: Impaired Detail Proprioception Impaired Details: Impaired LUE Coordination Gross Motor Movements are Fluid and Coordinated: No Fine Motor Movements are Fluid and Coordinated: No Coordination and Movement Description: decreased motor movement Motor  Motor Motor: Hemiplegia;Abnormal postural alignment and control;Abnormal tone Motor - Skilled Clinical Observations: Lt. hemiparesis, motor apraxia Mobility  Bed Mobility Bed Mobility: Supine to Sit;Sit to Supine Supine to Sit: 1: +1 Total assist Supine to Sit Details: Tactile cues for initiation;Tactile cues for sequencing;Tactile cues for weight shifting;Verbal cues for technique;Verbal cues for sequencing;Visual cues/gestures for sequencing Supine to Sit Details (indicate cue type and reason): Decreased ability to initiate, complete transition. Poor trunk control during transition Sit to Supine: 1: +1 Total assist Transfers Sit to Stand: From toilet;1: +2 Total  assist Sit to Stand Details: Tactile cues for placement;Tactile cues for posture;Tactile cues for weight shifting;Tactile cues for sequencing;Manual facilitation for placement;Manual facilitation for weight bearing;Manual facilitation for weight shifting Stand to Sit: 1: +1 Total assist Stand to Sit Details (indicate cue type and reason): Manual facilitation for weight bearing;Manual facilitation for placement;Manual facilitation for weight shifting  Trunk/Postural Assessment  Cervical Assessment Cervical Assessment: Exceptions to [facility] Cervical AROM Overall Cervical AROM: Due to premorid status Thoracic Assessment Thoracic Assessment: Exceptions to [facility] Thoracic AROM Overall Thoracic AROM: Due to premorid status Lumbar Assessment Lumbar Assessment: Exceptions to [facility] Lumbar AROM Overall Lumbar AROM Comments: posterior pelvic tilt Postural Control Postural Control: Deficits on evaluation Trunk Control: Posterior Lt. lean  in sitting, able to correct for ~15-20 sec at a time.  Righting Reactions: Evident most during transitions Postural Limitations: posterior pelvic tilt in sitting  Balance Balance Balance Assessed: Yes Static Sitting Balance Static Sitting - Balance Support: Right upper extremity supported Static Sitting - Level of Assistance: 4: Min assist Static Sitting - Comment/# of Minutes: Tactile cues and manual facilitation needed to maintain upright sitting, tends to lean posteriorly and to the Lt.  Dynamic Sitting Balance Dynamic Sitting - Level of Assistance: 2: Max assist Static Standing Balance Static Standing - Balance Support: Right upper extremity supported;During functional activity Static Standing - Level of Assistance: 1: +2 Total assist Static Standing - Comment/# of Minutes:  (stands in kyphotic position) Extremity/Trunk Assessment RUE Assessment RUE Assessment: Within Functional Limits LUE Assessment LUE Assessment: Exceptions to WFL LUE Tone LUE Tone  Comments: brunnstrom stage 3;  minimal movements in shoulder  FIM:      Refer to Care Plan for Long Term Goals  Recommendations for other services: None  Discharge Criteria: Patient will be discharged from OT if patient refuses treatment 3 consecutive times without medical reason, if treatment goals not met, if there is a change in medical status, if patient makes no progress towards goals or if patient is discharged from hospital.  The above assessment, treatment plan, treatment alternatives and goals were discussed and mutually agreed upon: by patient  Humberto Seals 02/23/2013, 10:57 AM

## 2013-02-23 NOTE — Care Management Note (Signed)
Inpatient Rehabilitation Center Individual Statement of Services  Patient Name:  [name]  Date:  02/23/2013  Welcome to the Inpatient Rehabilitation Center.  Our goal is to provide you with an individualized program based on your diagnosis and situation, designed to meet your specific needs.  With this comprehensive rehabilitation program, you will be expected to participate in at least 3 hours of rehabilitation therapies Monday-Friday, with modified therapy programming on the weekends.  Your rehabilitation program will include the following services:  Physical Therapy (PT), Occupational Therapy (OT), Speech Therapy (ST), 24 hour per day rehabilitation nursing, Neuropsychology, Case Management ( Social Worker), Rehabilitation Medicine, Nutrition Services and Pharmacy Services  Weekly team conferences will be held on Wednesday to discuss your progress.  Your  Social Worker will talk with you frequently to get your input and to update you on team discussions.  Team conferences with you and your family in attendance may also be held.  Expected length of stay: 3-4 weeks Overall anticipated outcome: min/mod level  Depending on your progress and recovery, your program may change.  Your  Social Worker will coordinate services and will keep you informed of any changes.  Your SW name and contact number are listed  below.  The following services may also be recommended but are not provided by the Inpatient Rehabilitation Center:   Driving Evaluations  Home Health Rehabiltiation Services  Outpatient Rehabilitatation [facility]  Vocational Rehabilitation   Arrangements will be made to provide these services after discharge if needed.  Arrangements include referral to agencies that provide these services.  Your insurance has been verified to be:  [employer]-Medicare Your primary doctor is:  Dr. Kirstie Peri  Pertinent information will be shared with your doctor and your insurance company.   Social  Worker:  Dossie Der, [place] [phone]  Information discussed with and copy given to patient by: Lucy Chris, 02/23/2013, 11:37 AM

## 2013-02-23 NOTE — Progress Notes (Signed)
Social Work Assessment and Plan Social Work Assessment and Plan  Patient Details  Name: [name] MRN: [id number] Date of Birth: 04/08/1931  Today's Date: 02/23/2013  Problem List:  Patient Active Problem List  Diagnosis  . MIXED HYPERLIPIDEMIA  . ELEVATED BLOOD PRESSURE  . CORONARY ATHEROSCLEROSIS NATIVE CORONARY ARTERY  . Carotid bruit  . CVA (cerebral infarction)  . Spinal stenosis in cervical region  . Bilateral chronic knee pain   Past Medical History:  Past Medical History  Diagnosis Date  . Coronary atherosclerosis of native coronary artery     Two-vessel, LVEF 60%  . Myocardial infarction     1980, managed conservatively  . Hypothyroidism   . GERD (gastroesophageal reflux disease)   . Herpes zoster   . Hx of colonic polyps   . Mixed hyperlipidemia   . Arthritis   . Rosacea   . Urge incontinence   . Allergic rhinitis   . Seborrheic dermatitis    Past Surgical History:  Past Surgical History  Procedure Laterality Date  . Coronary artery bypass graft  12/11    Dr. Tyrone Sage - off pump LIMA to LAD, SVG to RCA  . Tonsillectomy    . Submandibular salivary gland resection     Social History:  reports that he quit smoking about 34 years ago. His smoking use included Cigarettes. He has a 30 pack-year smoking history. He has never used smokeless tobacco. He reports that  drinks alcohol. He reports that he does not use illicit drugs.  Family / Support Systems Marital Status: Widow/Widower Patient Roles: Parent;Volunteer Children: Emelia Salisbury [place]-daughter  [phone] Other Supports: [name]  [phone] Anticipated Caregiver: Hired assist and daughter Ability/Limitations of Caregiver: No limitation if hired assistance, daughter can not do much lifting Caregiver Availability: 24/7 Family Dynamics: Pt reports he is close to his family and can depend upon them.  His son from [place] was here when this first happened.  His daughter was here this am but has  left to do errands.  Social History Preferred language: English Religion:  Cultural Background: No issues Education: College Educated Read: Yes Write: Yes Employment Status: Retired Fish farm manager Issues: No issues Guardian/Conservator: Dorthea Cove [phone]  [phone] is his POA.  Pt is also capable of making his own decisions according to MD   Abuse/Neglect Physical Abuse: Denies Verbal Abuse: Denies Sexual Abuse: Denies Exploitation of patient/patient's resources: Denies Self-Neglect: Denies  Emotional Status Pt's affect, behavior adn adjustment status: Pt wants to improve and get back to as independent as possible.  He is very tired from his am therapies.  He never knew he would be affected this way.  He is used to being able to do for himself and now he has to adjust to asking others for assistance.  He reports: " This is not easy to do." Recent Psychosocial Issues: Other medical issues-neck and knee pain Pyschiatric History: No history-deferred depression screen due to being tired, will monitor his coping and try at another time.  Patient / Family Perceptions, Expectations & Goals Pt/Family understanding of illness & functional limitations: Pt and daughter have a good understanding of his stroke and have spoken with MD's daily regarding his recovery.  Pt wants to get back home and be able to do as much as he can for himself.  Daughter will do what she needs to do to help him. Premorbid pt/family roles/activities: Father, grandfather, Retiree, Home owner, Mining engineer, etc Anticipated changes in roles/activities/participation: resume Pt/family expectations/goals: Pt states: " I  want to do as much as I can for myself."  Daughter states: " I am hopeful he will do well here and will make whatever arrangements I need to make."  [employer]: Other (Comment) (recently at [facility]) Premorbid Home Care/DME Agencies:  None Transportation available at discharge: Family Resource referrals recommended: Support group (specify) (CVA Support Group)  Discharge Planning Living Arrangements: Alone Support Systems: Children;Friends/neighbors;Church/faith community Type of Residence: Private residence Insurance Resources: Media planner (specify) Investment banker, operational) Financial Resources: Restaurant manager, fast food Screen Referred: No Living Expenses: Own Money Management: Patient Do you have any problems obtaining your medications?: No Home Management: Hired housekeeper-prepared simple meals Patient/Family Preliminary Plans: Return home, pt reprots he will hire assistance.  Discussed he would need 24 hour caregivers.  Aware of the option of NHP, since was recently in one for a few days until readmitted to hospital. Social Work Anticipated Follow Up Needs: HH/OP;SNF;Support Group DC Planning Additional Notes/Comments: No set plan in place yet, awaiting pt's progress.  Clinical Impression Pleasant gentleman who is motivated to improve and become as independent as possible.  Supportive family who are willing to assist.  Await progress and then come up with a definite discharge plan. Daughter plans to be here and observe in therapies.  Lucy Chris 02/23/2013, 3:04 PM

## 2013-02-23 NOTE — Progress Notes (Signed)
Patient ID: [name], male   DOB: February 02, 1931, 79 y.o.   MRN: [id number]  Subjective/Complaints: 79 year-old male with history of CAD, CABG who was recently evaluated in the emergency room 2 weeks ago at [facility] with weakness of his left arm and left leg noted to have acute infarct. By report he was subsequently transferred to [facility] after evaluation Plavix was added to aspirin. He did not receive TPA per report. Patient noted to have seizure and was loaded with intravenous Dilantin. Paperwork was very minimal due to workup being done at [facility]. He was later transferred back to [facility] for ongoing physical therapy and left-sided weakness secondary to stroke.  The patient has knee pain and takes tramadol at home for this.  The patient has a history of neck pain but not currently complaining. MRI in 2008 demonstrating multilevel cervical stenosis C3-C7 levels  I just want to get out of here. Review of Systems  Neurological: Positive for focal weakness. Negative for seizures.    Objective: Vital Signs: Blood pressure 145/75, pulse 67, temperature 98.5 F (36.9 C), temperature source Oral, resp. rate 18, height 5\' 7"  (1.702 m), weight 79.2 kg (174 lb 9.7 oz), SpO2 96.00%. Dg Knee Ap/lat W/sunrise Left  02/22/2013  *RADIOLOGY REPORT*  Clinical Data: Chronic knee pain  DG KNEE - 3 VIEWS  Comparison: None.  Findings: There is tricompartmental degenerative joint disease primarily involving the medial compartment and patellofemoral compartments.  There is loss of joint space, sclerosis, and spurring involving these compartments.  No fracture is seen and no effusion is noted.  There is calcification of the left superficial femoral artery and popliteal artery.  IMPRESSION: Tricompartmental degenerative joint disease.  No acute abnormality.   Original Report Authenticated By: Dwyane Dee, M.D.    Dg Knee Ap/lat W/sunrise Right  02/22/2013  *RADIOLOGY REPORT*   Clinical Data: Chronic knee pain  DG KNEE - 3 VIEWS  Comparison: None  Findings: Osseous demineralization. Joint space narrowing and marginal spur formation at medial compartment and patellofemoral joint, less at lateral compartment. No acute fracture, dislocation or bone destruction. No knee joint effusion. Extensive atherosclerotic calcification.  IMPRESSION: Osseous demineralization with osteoarthritic changes right knee.   Original Report Authenticated By: Ulyses Southward, M.D.    Results for orders placed during the hospital encounter of 02/22/13 (from the past 72 hour(s))  MRSA PCR SCREENING     Status: None   Collection Time    02/22/13  2:12 PM      Result Value Range   MRSA by PCR NEGATIVE  NEGATIVE   Comment:            The GeneXpert MRSA Assay (FDA     approved for NASAL specimens     only), is one component of a     comprehensive MRSA colonization     surveillance program. It is not     intended to diagnose MRSA     infection nor to guide or     monitor treatment for     MRSA infections.      Vitals reviewed.  Constitutional: He is oriented to person, place, and time. He appears well-developed.  Mood/affect flat HENT:  Head: Normocephalic.  Eyes: EOM are normal.  Neck: Neck supple. No thyromegaly present.  Cardiovascular: Normal rate and regular rhythm.  Pulmonary/Chest: Effort normal and breath sounds normal. No respiratory distress.  Abdominal: Soft. Bowel sounds are normal. He exhibits no distension.  Musculoskeletal: He exhibits no edema.  Neurological: He is alert and oriented to person, place, and time.  Follows three step commands  Motor strength is 5/5 in the right deltoid, biceps, triceps, grip, hip flexor, knee extensors, ankle dorsiflexor and plantar flexor  Motor strength is 3 minus/5 in the left deltoid, biceps, triceps, grip, hip flexor, 4 minus in the knee extensor, 3 minus in ankle dorsiflexor and plantar flexor  Sensation is intact to light touch  There is  evidence of tactile extinction on double simultaneous stimulation  There is evidence of left-sided neglect on visual confrontation testing  There is a right gaze preference  Verbal output is sparse but no evidence of dysarthria  Patient does have decreased sensation to light touch in the left. Midline chest incision well healed    Assessment/Plan: 1. Functional deficits secondary to R MCA distribution infarct which require 3+ hours per day of interdisciplinary therapy in a comprehensive inpatient rehab setting. Physiatrist is providing close team supervision and 24 hour management of active medical problems listed below. Physiatrist and rehab team continue to assess barriers to discharge/monitor patient progress toward functional and medical goals. FIM:                   Comprehension Comprehension Mode: Auditory Comprehension: 5-Follows basic conversation/direction: With extra time/assistive device  Expression Expression Mode: Verbal Expression: 5-Expresses basic needs/ideas: With no assist  Social Interaction Social Interaction: 5-Interacts appropriately 90% of the time - Needs monitoring or encouragement for participation or interaction.  Problem Solving Problem Solving: 5-Solves basic problems: With no assist  Memory Memory: 6-More than reasonable amt of time Medical Problem List and Plan:  1. Recent CVA with left-sided weakness. Will attempt to obtain workup while at [facility]  2. DVT Prophylaxis/Anticoagulation: SCDs. Monitor for any signs of DVT  3. Mood: Zoloft 50 mg daily. Provide emotional support and positive reinforcement  4. Neuropsych: This patient Is capable of making decisions on his/her own behalf.  5. Pain management. Vicodin as needed for headaches  6. Seizure disorder. Dilantin 100 mg 3 times daily. Check Dilantin level and monitor for signs of seizure  7. Hypothyroidism. Synthroid 125 mcg daily. We'll check old records for thyroid studies   8. BPH history of prostate cancer. Flomax/Casodex. Check PVRs x3  9. Cervical spinal stenosis without evidence of radiculopathy  10. Bilateral knee pain suspect osteoarthritis will check x-rays, continue tramadol for pain      LOS (Days) 1 A FACE TO FACE EVALUATION WAS PERFORMED  KIRSTEINS,ANDREW E 02/23/2013, 6:59 AM

## 2013-02-23 NOTE — Progress Notes (Signed)
Physical Therapy Session Note  Patient Details  Name: [name] MRN: [id number] Date of Birth: Jan 10, 1931  Today's Date: 02/23/2013 Time: 1100-1130 Time Calculation (min): 30 min  Skilled Therapeutic Interventions/Progress Updates:  Pt propelled wheelchair with bil. LEs x 40' with PT facilitating Lt. LE to assist with propulsion. Pt demonstrates significant Lt. Sided inattention and runs into obstacles and walls on Lt. Donned shoes which helped slightly with speed but Lt. LE was still significantly impaired. Sit <> stands in parallel bars from wheelchair with total assist +1. Lt. Lean in standing, flexed posture. PT to facilitate Rt. Weight shifting and hip extension. Cues throughout session for attention to Lt. UE for safety of limb.   Therapy Documentation Precautions:  Precautions Precautions: Fall Precaution Comments: Lt. hemiparesis Restrictions Weight Bearing Restrictions: No Pain: Pain Assessment Pain Assessment: No/denies pain  See FIM for current functional status  Therapy/Group: Individual Therapy  Wilhemina Bonito 02/23/2013, 11:44 AM

## 2013-02-23 NOTE — Plan of Care (Addendum)
Overall Plan of Care [facility]) Patient Details Name: [name] MRN: [id number] DOB: [birth year]  Diagnosis:  Left hemiparesis due to Right MCA distribution infarct  Co-morbidities: Coronary atherosclerosis of native coronary artery  Two-vessel, LVEF 60%  .  Myocardial infarction  1980, managed conservatively  .  Hypothyroidism  .  GERD (gastroesophageal reflux disease)  .  Herpes zoster  .  Hx of colonic polyps  .  Mixed hyperlipidemia  .  Arthritis  .  Rosacea  .  Urge incontinence  .  Allergic rhinitis  .  Seborrheic dermatitis    Functional Problem List  Patient demonstrates impairments in the following areas: Balance, Bladder, Bowel, Endurance, Medication Management, Motor, Pain, Safety and Skin Integrity  Basic ADL's: grooming, bathing, dressing, toileting and transfers Advanced ADL's: simple meal preparation and laundry  Transfers:  bed mobility, bed to chair, toilet, tub/shower, car and furniture Locomotion:  wheelchair mobility  Additional Impairments:  Functional use of upper extremity, Social Cognition   problem solving, memory, attention and awareness and Discharge Disposition  Anticipated Outcomes Item Anticipated Outcome  Eating/Swallowing    Basic self-care  Minimal assist  Tolieting  supervision  Bowel/Bladder  Pt will be continent of bowel and bladder with min assist  Transfers  Min assist  Locomotion  Moderate assist  Communication    Cognition  Supervision   Pain  Pt will rate pain less than 3 on scale 0-1-  Safety/Judgment  Pt will call for assist with min assist  Other  Sacrum will remain intact during admission with min assist   Therapy Plan: PT Intensity: Minimum of 1-2 x/day ,45 to 90 minutes PT Frequency: 5 out of 7 days PT Duration Estimated Length of Stay: 4 weeks OT Intensity: Minimum of 1-2 x/day, 45 to 90 minutes OT Frequency: 5 out of 7 days OT Duration/Estimated Length of Stay: 3-4 weeks SLP Intensity:  Minumum of 1-2 x/day, 30 to 90 minutes SLP Frequency: 5 out of 7 days SLP Duration/Estimated Length of Stay: 3.5 weeks    Team Interventions: Item RN PT OT SLP SW TR Other  Self Care/Advanced ADL Retraining   X      Neuromuscular Re-Education  x X      Therapeutic Activities  x X      UE/LE Strength Training/ROM  x X      UE/LE Coordination Activities  x X      Visual/Perceptual Remediation/Compensation  x X      DME/Adaptive Equipment Instruction  x X      Therapeutic Exercise  x X      Balance/Vestibular Training  x X      Patient/Family Education  x X x     Cognitive Remediation/Compensation  x  x     Functional Mobility Training  x X      Ambulation/Gait Training  x       Stair Training  x       Wheelchair Propulsion/Positioning  x X      Surveyor, mining Facilitation         Bladder Management x        Bowel Management x        Disease Management/Prevention         Pain Management x        Medication Management x  Skin Care/Wound Management         Splinting/Orthotics         Discharge Planning  x X x     Psychosocial Support x x                              Team Discharge Planning: Destination: PT-Skilled Nursing Facility (SNF) (pending progress) ,OT- HOME WITH 24 SUPERVISION OR SNF  , SLP-Home Projected Follow-up: PT-Skilled nursing facility;Home health PT (pending progress), OT- HH OT  , SLP-Outpatient SLP;24 hour supervision/assistance Projected Equipment Needs: PT- (to be determined), OT-TUB BENCH  , SLP-None recommended by SLP Patient/family involved in discharge planning: PT- Patient,  OT-Patient, SLP-Patient  MD ELOS: 2 wks Medical Rehab Prognosis:  Good Assessment: 79 yo male presented to outside hospital with L HP now requiring 24/7 Rehab MD, RN and CIR level PT,OT,SLP for Left HP, Left neglect.  Team will focus on L side  awaremenss , mobility , ADL , NM re education.  ADL and Mobility goals at min/Sup level  See Team Conference Notes for weekly updates to the plan of care

## 2013-02-24 ENCOUNTER — Inpatient Hospital Stay (HOSPITAL_COMMUNITY): Payer: Medicare Other | Admitting: Occupational Therapy

## 2013-02-24 ENCOUNTER — Inpatient Hospital Stay (HOSPITAL_COMMUNITY): Payer: Medicare Other | Admitting: Speech Pathology

## 2013-02-24 ENCOUNTER — Inpatient Hospital Stay (HOSPITAL_COMMUNITY): Payer: Medicare Other | Admitting: Physical Therapy

## 2013-02-24 DIAGNOSIS — I6330 Cerebral infarction due to thrombosis of unspecified cerebral artery: Secondary | ICD-10-CM

## 2013-02-24 NOTE — Progress Notes (Signed)
Patient ID: [name], male   DOB: 1931-12-22, 79 y.o.   MRN: [id number] Patient ID: [name], male   DOB: 1931-11-11, 79 y.o.   MRN: [id number]  64/52.  79 year old patient status post right MCA infarct. History of CAD status post CABG;  other medical illnesses include osteoarthritis involving the knees and a history of cervical spinal stenosis;  she has hypertension and dyslipidemia.  BP Readings from Last 3 Encounters:  02/24/13 150/79  11/15/12 145/98  05/03/12 132/78    Gen- alert and offering no complaints ENT unremarkable Chest clear Cardiovascular normal heart sounds no ectopics no murmur Abdomen no distention soft nontender Extremities- TED stockings in place; left-sided weakness  Impr- s/p R CVA HTN-stable    Subjective/Complaints: 79 year-old male with history of CAD, CABG who was recently evaluated in the emergency room 2 weeks ago at [facility] with weakness of his left arm and left leg noted to have acute infarct. By report he was subsequently transferred to [facility] after evaluation Plavix was added to aspirin. He did not receive TPA per report. Patient noted to have seizure and was loaded with intravenous Dilantin. Paperwork was very minimal due to workup being done at [facility]. He was later transferred back to [facility] for ongoing physical therapy and left-sided weakness secondary to stroke.  The patient has knee pain and takes tramadol at home for this.  The patient has a history of neck pain but not currently complaining. MRI in 2008 demonstrating multilevel cervical stenosis C3-C7 levels  I just want to get out of here. Review of Systems  Neurological: Positive for focal weakness. Negative for seizures.    Objective: Vital Signs: Blood pressure 150/79, pulse 68, temperature 97.5 F (36.4 C), temperature source Oral, resp. rate 18, height 5\' 7"  (1.702 m), weight 79.2 kg (174 lb 9.7 oz), SpO2 98.00%. Dg Knee Ap/lat  W/sunrise Left  02/22/2013  *RADIOLOGY REPORT*  Clinical Data: Chronic knee pain  DG KNEE - 3 VIEWS  Comparison: None.  Findings: There is tricompartmental degenerative joint disease primarily involving the medial compartment and patellofemoral compartments.  There is loss of joint space, sclerosis, and spurring involving these compartments.  No fracture is seen and no effusion is noted.  There is calcification of the left superficial femoral artery and popliteal artery.  IMPRESSION: Tricompartmental degenerative joint disease.  No acute abnormality.   Original Report Authenticated By: Dwyane Dee, M.D.    Dg Knee Ap/lat W/sunrise Right  02/22/2013  *RADIOLOGY REPORT*  Clinical Data: Chronic knee pain  DG KNEE - 3 VIEWS  Comparison: None  Findings: Osseous demineralization. Joint space narrowing and marginal spur formation at medial compartment and patellofemoral joint, less at lateral compartment. No acute fracture, dislocation or bone destruction. No knee joint effusion. Extensive atherosclerotic calcification.  IMPRESSION: Osseous demineralization with osteoarthritic changes right knee.   Original Report Authenticated By: Ulyses Southward, M.D.    Results for orders placed during the hospital encounter of 02/22/13 (from the past 72 hour(s))  MRSA PCR SCREENING     Status: None   Collection Time    02/22/13  2:12 PM      Result Value Range   MRSA by PCR NEGATIVE  NEGATIVE   Comment:            The GeneXpert MRSA Assay (FDA     approved for NASAL specimens     only), is one component of a     comprehensive MRSA colonization  surveillance program. It is not     intended to diagnose MRSA     infection nor to guide or     monitor treatment for     MRSA infections.  CBC WITH DIFFERENTIAL     Status: Abnormal   Collection Time    02/23/13  8:30 AM      Result Value Range   WBC 10.0  4.0 - 10.5 K/uL   RBC 3.44 (*) 4.22 - 5.81 MIL/uL   Hemoglobin 10.2 (*) 13.0 - 17.0 g/dL   HCT 11.9 (*) 14.7 -  52.0 %   MCV 85.8  78.0 - 100.0 fL   MCH 29.7  26.0 - 34.0 pg   MCHC 34.6  30.0 - 36.0 g/dL   RDW 82.9  56.2 - 13.0 %   Platelets 258  150 - 400 K/uL   Neutrophils Relative 68  43 - 77 %   Neutro Abs 6.8  1.7 - 7.7 K/uL   Lymphocytes Relative 23  12 - 46 %   Lymphs Abs 2.3  0.7 - 4.0 K/uL   Monocytes Relative 7  3 - 12 %   Monocytes Absolute 0.7  0.1 - 1.0 K/uL   Eosinophils Relative 1  0 - 5 %   Eosinophils Absolute 0.1  0.0 - 0.7 K/uL   Basophils Relative 0  0 - 1 %   Basophils Absolute 0.0  0.0 - 0.1 K/uL  COMPREHENSIVE METABOLIC PANEL     Status: Abnormal   Collection Time    02/23/13  8:30 AM      Result Value Range   Sodium 128 (*) 135 - 145 mEq/L   Potassium 3.3 (*) 3.5 - 5.1 mEq/L   Chloride 93 (*) 96 - 112 mEq/L   CO2 24  19 - 32 mEq/L   Glucose, Bld 116 (*) 70 - 99 mg/dL   BUN 6  6 - 23 mg/dL   Creatinine, Ser 8.65  0.50 - 1.35 mg/dL   Calcium 8.9  8.4 - 78.4 mg/dL   Total Protein 6.2  6.0 - 8.3 g/dL   Albumin 3.0 (*) 3.5 - 5.2 g/dL   AST 22  0 - 37 U/L   ALT 21  0 - 53 U/L   Alkaline Phosphatase 91  39 - 117 U/L   Total Bilirubin 0.3  0.3 - 1.2 mg/dL   GFR calc non Af Amer >90  >90 mL/min   GFR calc Af Amer >90  >90 mL/min   Comment:            The eGFR has been calculated     using the CKD EPI equation.     This calculation has not been     validated in all clinical     situations.     eGFR's persistently     <90 mL/min signify     possible Chronic Kidney Disease.      Vitals reviewed.  Constitutional: He is oriented to person, place, and time. He appears well-developed.  Mood/affect flat HENT:  Head: Normocephalic.  Eyes: EOM are normal.  Neck: Neck supple. No thyromegaly present.  Cardiovascular: Normal rate and regular rhythm.  Pulmonary/Chest: Effort normal and breath sounds normal. No respiratory distress.  Abdominal: Soft. Bowel sounds are normal. He exhibits no distension.  Musculoskeletal: He exhibits no edema.  Neurological: He is alert  and oriented to person, place, and time.  Follows three step commands  Motor strength is 5/5 in the right deltoid, biceps,  triceps, grip, hip flexor, knee extensors, ankle dorsiflexor and plantar flexor  Motor strength is 3 minus/5 in the left deltoid, biceps, triceps, grip, hip flexor, 4 minus in the knee extensor, 3 minus in ankle dorsiflexor and plantar flexor  Sensation is intact to light touch  There is evidence of tactile extinction on double simultaneous stimulation  There is evidence of left-sided neglect on visual confrontation testing  There is a right gaze preference  Verbal output is sparse but no evidence of dysarthria  Patient does have decreased sensation to light touch in the left. Midline chest incision well healed    Assessment/Plan: 1. Functional deficits secondary to R MCA distribution infarct which require 3+ hours per day of interdisciplinary therapy in a comprehensive inpatient rehab setting. Physiatrist is providing close team supervision and 24 hour management of active medical problems listed below. Physiatrist and rehab team continue to assess barriers to discharge/monitor patient progress toward functional and medical goals. FIM: FIM - Bathing Bathing Steps Patient Completed: Chest;Right Arm;Left Arm;Abdomen;Right upper leg;Left upper leg Bathing: 3: Mod-Patient completes 5-7 83f 10 parts or 50-74%  FIM - Upper Body Dressing/Undressing Upper body dressing/undressing steps patient completed: Thread/unthread right sleeve of pullover shirt/dresss Upper body dressing/undressing: 2: Max-Patient completed 25-49% of tasks FIM - Lower Body Dressing/Undressing Lower body dressing/undressing steps patient completed: Thread/unthread right pants leg Lower body dressing/undressing: 2: Max-Patient completed 25-49% of tasks  FIM - Toileting Toileting: 1: Two helpers  FIM - Diplomatic Services operational officer Devices: Psychiatrist Transfers: 1-To  toilet/BSC: Total A (helper does all/Pt. < 25%);1-From toilet/BSC: Total A (helper does all/Pt. < 25%);1-Two helpers  FIM - Games developer Transfer: 1: Two helpers;2: Supine > Sit: Max A (lifting assist/Pt. 25-49%);1: Sit > Supine: Total A (helper does all/Pt. < 25%);1: Bed > Chair or W/C: Total A (helper does all/Pt. < 25%)  FIM - Locomotion: Wheelchair Distance: 40' Locomotion: Wheelchair: 1: Travels less than 50 ft with moderate assistance (Pt: 50 - 74%) FIM - Locomotion: Ambulation Ambulation/Gait Assistance:  (attempted, pt unable secondary to weakness & Lt. lean)  Comprehension Comprehension Mode: Auditory Comprehension: 5-Follows basic conversation/direction: With no assist  Expression Expression Mode: Verbal Expression: 5-Expresses basic needs/ideas: With no assist  Social Interaction Social Interaction: 5-Interacts appropriately 90% of the time - Needs monitoring or encouragement for participation or interaction.  Problem Solving Problem Solving Mode: Not assessed Problem Solving: 5-Solves basic 90% of the time/requires cueing < 10% of the time  Memory Memory Mode: Not assessed Memory: 4-Recognizes or recalls 75 - 89% of the time/requires cueing 10 - 24% of the time Medical Problem List and Plan:  1. Recent CVA with left-sided weakness. Will attempt to obtain workup while at [facility]  2. DVT Prophylaxis/Anticoagulation: SCDs. Monitor for any signs of DVT  3. Mood: Zoloft 50 mg daily. Provide emotional support and positive reinforcement  4. Neuropsych: This patient Is capable of making decisions on his/her own behalf.  5. Pain management. Vicodin as needed for headaches  6. Seizure disorder. Dilantin 100 mg 3 times daily. Check Dilantin level and monitor for signs of seizure  7. Hypothyroidism. Synthroid 125 mcg daily. We'll check old records for thyroid studies  8. BPH history of prostate cancer. Flomax/Casodex. Check PVRs x3  9. Cervical spinal  stenosis without evidence of radiculopathy  10. Bilateral knee pain suspect osteoarthritis will check x-rays, continue tramadol for pain      LOS (Days) 2 A FACE TO FACE EVALUATION WAS PERFORMED  Raul Torrance  FRANK 02/24/2013, 8:16 AM

## 2013-02-24 NOTE — Progress Notes (Signed)
Physical Therapy Note  Patient Details  Name: [name] MRN: [id number] Date of Birth: 18-Jun-1931 Today's Date: 02/24/2013  1100-1155 (55 minutes) individual Pain: 5/10 low back/ premedicated Focus of treatment: Neuro re-ed Lt LE; standing tolerance; gait training Treatment: Pt up in wc upon arrival; transfers stand/pivot max assist ; sit to supine (mat) mod assist LT LE/ trunk; rolling min assist with tactile cues for technique; side to sit mod assist; Neuro re-ed LT LE- ankle pumps , heel slides, hip abduction/ adduction, SAQs; Sit to stand from edge of raised mat initially mod/max assist to mod assist; standing to EVA walker mod assist with decreased hip extension; standing to RW mod assist with forward flexed trunk; gait with EVA walker 5-8 feet X 2 mod assist forward to guide assistive device to max assist stepping backward.  1400-1455 (25 minutes) individual Pain: no reported pain Focus of treatment: sit to stand/standing tolerance Treatment: sit to stand X 2  to back of recliner max assist  ; standing with decreased hip extension for < 2-3  minutes before c/o fatigue; transfer stand/turn from low wc to bed max assist +1; sit to supine mod assist LT LE/trunk.     Detrice Cales,JIM 02/24/2013, 12:50 PM

## 2013-02-24 NOTE — Progress Notes (Signed)
Speech Language Pathology Daily Session Note  Patient Details  Name: [name] MRN: [id number] Date of Birth: 15-Oct-1931  Today's Date: 02/24/2013 Time: 4098-1191 Time Calculation (min): 40 min  Short Term Goals: Week 1: SLP Short Term Goal 1 (Week 1): Patient will attent to left environment during reading and writing task with supervision level verbal cues. SLP Short Term Goal 2 (Week 1): Patient will attend to and utilize left upper extremity during functional tasks with min assist question cues. SLP Short Term Goal 3 (Week 1): Patient will utilize external aids to recall new information with min assist question cues. SLP Short Term Goal 4 (Week 1): Patient will solve moderately complex problems with min assist verbal cues.   SLP Short Term Goal 5 (Week 1): Patient will sustain attention to functional task for 30 miutes with min assist vebral cues for re-direction.  Skilled Therapeutic Interventions: Skilled treatment session focused on addressing cognitive goals during self-care tasks.  SLP facilitated session with min-mod assist verbal and visual cues to problem solve tasks such as wheelchair management by locking and unlocking breaks, opening and turning pages of the newspaper and utilizing external aids to route find way back to room.  Patient also required max assist verbal cues to attend to left environment and upper extremity during tasks.    FIM:  Comprehension Comprehension Mode: Auditory Comprehension: 5-Follows basic conversation/direction: With no assist Expression Expression Mode: Verbal Expression: 5-Expresses basic needs/ideas: With no assist Social Interaction Social Interaction: 4-Interacts appropriately 75 - 89% of the time - Needs redirection for appropriate language or to initiate interaction. Problem Solving Problem Solving: 4-Solves basic 75 - 89% of the time/requires cueing 10 - 24% of the time Memory Memory: 4-Recognizes or recalls 75 - 89% of the  time/requires cueing 10 - 24% of the time  Pain Pain Assessment Pain Assessment: No/denies pain  Therapy/Group: Individual Therapy  Charlane Ferretti., CCC-SLP [phone]  [name] 02/24/2013, 11:04 AM

## 2013-02-24 NOTE — Progress Notes (Signed)
Occupational Therapy Session Note  Patient Details  Name: [name] MRN: [id number] Date of Birth: [birth year]  Today's Date: 02/24/2013 Time: 0730-0840 Time Calculation (min): 70 min   Skilled Therapeutic Interventions/Progress Updates: ADL with focus on maintaining midline postural and scanning left environment, bilateral activities as patient required reminders to use left hand, bed to w/c transfer and basic command following.  Patient required extra time to attempt to void and stated he had "prostate problems and took a long time" prior to admission.  Patient with lateral leans L or R during breakfast and during ADL in bed and EOB;   Patient required xtra time for cognitive processing of tasks and for movements.Total Ax2 this am for squat pivot bed to w/c.  Patient fairly stiff and pushed slightly to his left during transfer     Therapy Documentation Precautions:  Precautions Precautions: Fall Precaution Comments: Lt. hemiparesis Restrictions Weight Bearing Restrictions: No   Pain: Pain Assessment Pain Assessment: No/denies pain   See FIM for current functional status  Therapy/Group: Individual Therapy  [name] [facility] 02/24/2013, 9:15 AM

## 2013-02-25 ENCOUNTER — Inpatient Hospital Stay (HOSPITAL_COMMUNITY): Payer: Medicare Other | Admitting: Occupational Therapy

## 2013-02-25 NOTE — Progress Notes (Signed)
Occupational Therapy Session Note  Patient Details  Name: Loman Logan MRN: [id number] Date of Birth: 1931-09-19  Today's Date: 02/25/2013 Time: 3086-5784 Time Calculation (min): 70 min   Skilled Therapeutic Interventions/Progress Updates: ADL with lateral rolls for changing brief and LB b/dressing and in recliner at sink for UB b/dressing.  With focus on attention, initiation, postural alignment and transfers.   Patient required extra time to comprehend and process receptive information.  Max A x1 and 2nd person for safety for bed to recliner transfer today. Patient tended to push toward left during transfer to his right.    Therapy Documentation Precautions:  Precautions Precautions: Fall Precaution Comments: Lt. hemiparesis Restrictions Weight Bearing Restrictions: No Pain:denied   See FIM for current functional status  Therapy/Group: Individual Therapy  [name] [facility] 02/25/2013, 2:24 PM

## 2013-02-25 NOTE — Progress Notes (Signed)
Patient ID: [name], male   DOB: August 25, 1931, 79 y.o.   MRN: [id number] Patient ID: [name], male   DOB: 1931/06/01, 79 y.o.   MRN: [id number] Patient ID: [name], male   DOB: 20-Jun-1931, 79 y.o.   MRN: [id number]  53/66.  79 year old patient status post right MCA infarct. History of CAD status post CABG;  other medical illnesses include osteoarthritis involving the knees and a history of cervical spinal stenosis;  she has hypertension and dyslipidemia. Complaint today is impaired taste sensation.  BP Readings from Last 3 Encounters:  02/25/13 121/84  11/15/12 145/98  05/03/12 132/78    Gen- alert and offering no major complaints ENT unremarkable Chest clear Cardiovascular normal heart sounds no ectopics no murmur Abdomen no distention soft nontender Extremities- TED stockings in place; left-sided weakness  Impr- s/p R CVA HTN-stable    Subjective/Complaints: 79 year-old male with history of CAD, CABG who was recently evaluated in the emergency room 2 weeks ago at [facility] with weakness of his left arm and left leg noted to have acute infarct. By report he was subsequently transferred to [facility] after evaluation Plavix was added to aspirin. He did not receive TPA per report. Patient noted to have seizure and was loaded with intravenous Dilantin. Paperwork was very minimal due to workup being done at [facility]. He was later transferred back to [facility] for ongoing physical therapy and left-sided weakness secondary to stroke.  The patient has knee pain and takes tramadol at home for this.  The patient has a history of neck pain but not currently complaining. MRI in 2008 demonstrating multilevel cervical stenosis C3-C7 levels  I just want to get out of here. Review of Systems  Neurological: Positive for focal weakness. Negative for seizures.    Objective: Vital Signs: Blood pressure 121/84, pulse 78, temperature 98.3 F (36.8  C), temperature source Oral, resp. rate 17, height 5\' 7"  (1.702 m), weight 79.2 kg (174 lb 9.7 oz), SpO2 99.00%. No results found. Results for orders placed during the hospital encounter of 02/22/13 (from the past 72 hour(s))  MRSA PCR SCREENING     Status: None   Collection Time    02/22/13  2:12 PM      Result Value Range   MRSA by PCR NEGATIVE  NEGATIVE   Comment:            The GeneXpert MRSA Assay (FDA     approved for NASAL specimens     only), is one component of a     comprehensive MRSA colonization     surveillance program. It is not     intended to diagnose MRSA     infection nor to guide or     monitor treatment for     MRSA infections.  CBC WITH DIFFERENTIAL     Status: Abnormal   Collection Time    02/23/13  8:30 AM      Result Value Range   WBC 10.0  4.0 - 10.5 K/uL   RBC 3.44 (*) 4.22 - 5.81 MIL/uL   Hemoglobin 10.2 (*) 13.0 - 17.0 g/dL   HCT 08.6 (*) 57.8 - 46.9 %   MCV 85.8  78.0 - 100.0 fL   MCH 29.7  26.0 - 34.0 pg   MCHC 34.6  30.0 - 36.0 g/dL   RDW 62.9  52.8 - 41.3 %   Platelets 258  150 - 400 K/uL   Neutrophils Relative 68  43 - 77 %  Neutro Abs 6.8  1.7 - 7.7 K/uL   Lymphocytes Relative 23  12 - 46 %   Lymphs Abs 2.3  0.7 - 4.0 K/uL   Monocytes Relative 7  3 - 12 %   Monocytes Absolute 0.7  0.1 - 1.0 K/uL   Eosinophils Relative 1  0 - 5 %   Eosinophils Absolute 0.1  0.0 - 0.7 K/uL   Basophils Relative 0  0 - 1 %   Basophils Absolute 0.0  0.0 - 0.1 K/uL  COMPREHENSIVE METABOLIC PANEL     Status: Abnormal   Collection Time    02/23/13  8:30 AM      Result Value Range   Sodium 128 (*) 135 - 145 mEq/L   Potassium 3.3 (*) 3.5 - 5.1 mEq/L   Chloride 93 (*) 96 - 112 mEq/L   CO2 24  19 - 32 mEq/L   Glucose, Bld 116 (*) 70 - 99 mg/dL   BUN 6  6 - 23 mg/dL   Creatinine, Ser 7.82  0.50 - 1.35 mg/dL   Calcium 8.9  8.4 - 95.6 mg/dL   Total Protein 6.2  6.0 - 8.3 g/dL   Albumin 3.0 (*) 3.5 - 5.2 g/dL   AST 22  0 - 37 U/L   ALT 21  0 - 53 U/L    Alkaline Phosphatase 91  39 - 117 U/L   Total Bilirubin 0.3  0.3 - 1.2 mg/dL   GFR calc non Af Amer >90  >90 mL/min   GFR calc Af Amer >90  >90 mL/min   Comment:            The eGFR has been calculated     using the CKD EPI equation.     This calculation has not been     validated in all clinical     situations.     eGFR's persistently     <90 mL/min signify     possible Chronic Kidney Disease.      Vitals reviewed.  Constitutional: He is oriented to person, place, and time. He appears well-developed.  Mood/affect flat HENT:  Head: Normocephalic.  Eyes: EOM are normal.  Neck: Neck supple. No thyromegaly present.  Cardiovascular: Normal rate and regular rhythm.  Pulmonary/Chest: Effort normal and breath sounds normal. No respiratory distress.  Abdominal: Soft. Bowel sounds are normal. He exhibits no distension.  Musculoskeletal: He exhibits no edema.  Neurological: He is alert and oriented to person, place, and time.  Follows three step commands  Motor strength is 5/5 in the right deltoid, biceps, triceps, grip, hip flexor, knee extensors, ankle dorsiflexor and plantar flexor  Motor strength is 3 minus/5 in the left deltoid, biceps, triceps, grip, hip flexor, 4 minus in the knee extensor, 3 minus in ankle dorsiflexor and plantar flexor  Sensation is intact to light touch  There is evidence of tactile extinction on double simultaneous stimulation  There is evidence of left-sided neglect on visual confrontation testing  There is a right gaze preference  Verbal output is sparse but no evidence of dysarthria  Patient does have decreased sensation to light touch in the left. Midline chest incision well healed    Assessment/Plan: 1. Functional deficits secondary to R MCA distribution infarct which require 3+ hours per day of interdisciplinary therapy in a comprehensive inpatient rehab setting. Physiatrist is providing close team supervision and 24 hour management of active medical  problems listed below. Physiatrist and rehab team continue to assess barriers to discharge/monitor  patient progress toward functional and medical goals. FIM: FIM - Bathing Bathing Steps Patient Completed: Right upper leg;Left upper leg Bathing: 1: Total-Patient completes 0-2 of 10 parts or less than 25%  FIM - Upper Body Dressing/Undressing Upper body dressing/undressing steps patient completed: Thread/unthread right sleeve of pullover shirt/dresss Upper body dressing/undressing: 0: Activity did not occur FIM - Lower Body Dressing/Undressing Lower body dressing/undressing steps patient completed: Thread/unthread right pants leg Lower body dressing/undressing: 1: Total-Patient completed less than 25% of tasks  FIM - Toileting Toileting: 0: Activity did not occur  FIM - Diplomatic Services operational officer Devices: Psychiatrist Transfers: 0-Activity did not occur  FIM - Games developer Transfer: 1: Two helpers  FIM - Locomotion: Wheelchair Distance: 40' Locomotion: Wheelchair: 1: Travels less than 50 ft with moderate assistance (Pt: 50 - 74%) FIM - Locomotion: Ambulation Ambulation/Gait Assistance:  (attempted, pt unable secondary to weakness & Lt. lean)  Comprehension Comprehension Mode: Auditory Comprehension: 5-Follows basic conversation/direction: With no assist  Expression Expression Mode: Verbal Expression: 5-Expresses basic needs/ideas: With no assist  Social Interaction Social Interaction: 4-Interacts appropriately 75 - 89% of the time - Needs redirection for appropriate language or to initiate interaction.  Problem Solving Problem Solving Mode: Not assessed Problem Solving: 4-Solves basic 75 - 89% of the time/requires cueing 10 - 24% of the time  Memory Memory Mode: Not assessed Memory: 4-Recognizes or recalls 75 - 89% of the time/requires cueing 10 - 24% of the time Medical Problem List and Plan:  1. Recent CVA with left-sided  weakness. Will attempt to obtain workup while at [facility]  2. DVT Prophylaxis/Anticoagulation: SCDs. Monitor for any signs of DVT  3. Mood: Zoloft 50 mg daily. Provide emotional support and positive reinforcement  4. Neuropsych: This patient Is capable of making decisions on his/her own behalf.  5. Pain management. Vicodin as needed for headaches  6. Seizure disorder. Dilantin 100 mg 3 times daily. Check Dilantin level and monitor for signs of seizure  7. Hypothyroidism. Synthroid 125 mcg daily. We'll check old records for thyroid studies  8. BPH history of prostate cancer. Flomax/Casodex. Check PVRs x3  9. Cervical spinal stenosis without evidence of radiculopathy  10. Bilateral knee pain suspect osteoarthritis will check x-rays, continue tramadol for pain      LOS (Days) 3 A FACE TO FACE EVALUATION WAS PERFORMED  Rogelia Boga 02/25/2013, 8:12 AM

## 2013-02-25 NOTE — Progress Notes (Signed)
Appetite diminished per family report.  Patient recently started on Zoloft for mild depression.  Please continue to monitor daily weights.

## 2013-02-26 ENCOUNTER — Inpatient Hospital Stay (HOSPITAL_COMMUNITY): Payer: Medicare Other

## 2013-02-26 ENCOUNTER — Inpatient Hospital Stay (HOSPITAL_COMMUNITY): Payer: Medicare Other | Admitting: Physical Therapy

## 2013-02-26 ENCOUNTER — Encounter (HOSPITAL_COMMUNITY): Payer: Medicare Other

## 2013-02-26 ENCOUNTER — Encounter (HOSPITAL_COMMUNITY): Payer: Self-pay | Admitting: Physical Medicine and Rehabilitation

## 2013-02-26 DIAGNOSIS — C61 Malignant neoplasm of prostate: Secondary | ICD-10-CM | POA: Insufficient documentation

## 2013-02-26 DIAGNOSIS — I6330 Cerebral infarction due to thrombosis of unspecified cerebral artery: Secondary | ICD-10-CM

## 2013-02-26 DIAGNOSIS — M79609 Pain in unspecified limb: Secondary | ICD-10-CM

## 2013-02-26 DIAGNOSIS — G8110 Spastic hemiplegia affecting unspecified side: Secondary | ICD-10-CM

## 2013-02-26 LAB — BASIC METABOLIC PANEL
BUN: 7 mg/dL (ref 6–23)
Chloride: 93 mEq/L — ABNORMAL LOW (ref 96–112)
Creatinine, Ser: 0.47 mg/dL — ABNORMAL LOW (ref 0.50–1.35)
GFR calc Af Amer: >90 mL/min (ref >90)

## 2013-02-26 MED ORDER — SODIUM CHLORIDE 0.9 % IV SOLN
INTRAVENOUS | Status: DC
  Administered 2013-02-26 – 2013-02-28 (×2): via INTRAVENOUS

## 2013-02-26 MED ORDER — ENOXAPARIN SODIUM 30 MG/0.3ML ~~LOC~~ SOLN
30.0000 mg | Freq: Two times a day (BID) | SUBCUTANEOUS | Status: DC
  Administered 2013-02-26 – 2013-03-10 (×24): 30 mg via SUBCUTANEOUS
  Filled 2013-02-26 (×28): qty 0.3

## 2013-02-26 MED ORDER — ENOXAPARIN SODIUM 40 MG/0.4ML ~~LOC~~ SOLN
40.0000 mg | SUBCUTANEOUS | Status: DC
  Administered 2013-02-26: 40 mg via SUBCUTANEOUS
  Filled 2013-02-26: qty 0.4

## 2013-02-26 NOTE — Progress Notes (Signed)
Physical Therapy Session Note  Patient Details  Name: Namon Villarin MRN: [id number] Date of Birth: 03/28/1931  Today's Date: 02/26/2013 Time: 0865-7846 Time Calculation (min): 35 min  Short Term Goals: Week 1:  PT Short Term Goal 1 (Week 1): Pt will   Skilled Therapeutic Interventions/Progress Updates:  Pt sitting slumped in w/c, increased wt bearing L.  When questioned, pt stated that he was leaning to the R.  L arm support provided for w/c to improve safety of LUE and promote upright posture.  W/c propulsion using hemi technique, x 45' on level tile, min assist to avoid obstacles on the L and promote use of the R hand for propulsion. W/c propulsion x 35' on carpet, with 3 armchairs for obstacles; pt required mod assist to avoid them on the L, VCs, tactile cues to turn head L to visualize edge of w/c, obstacles, etc.  PROM bil hamstrings in sitting for increased flexibility to improve functional mobility  neuromuscular re-education via visual feedback, VCs for:  -L knee extension= R knee extension in sitting, max cues to attend to L and prolong muscle contraction -w/c propulsion using bil LEs, without use of R hand, max cues and intermittent mod assist for = alternating movements     Therapy Documentation Precautions:  Precautions Precautions: Fall Precaution Comments: L inatention Restrictions Weight Bearing Restrictions: No   Pain: Pain Assessment Faces Pain Scale: Hurts little more Pain Location: Neck Pain Intervention(s): Medication (See eMAR)   Locomotion : Wheelchair Mobility Distance: 45     See FIM for current functional status  Therapy/Group: Individual Therapy  COOK,CAROLINE 02/26/2013, 1:52 PM

## 2013-02-26 NOTE — Progress Notes (Signed)
Speech Language Pathology Daily Session Note  Patient Details  Name: [name] MRN: [id number] Date of Birth: 1931-10-14  Today's Date: 02/26/2013 Time: 1335-1405 Time Calculation (min): 30 min  Short Term Goals: Week 1: SLP Short Term Goal 1 (Week 1): Patient will attent to left environment during reading and writing task with supervision level verbal cues. SLP Short Term Goal 1 - Progress (Week 1): Progressing toward goal SLP Short Term Goal 2 (Week 1): Patient will attend to and utilize left upper extremity during functional tasks with min assist question cues. SLP Short Term Goal 2 - Progress (Week 1): Progressing toward goal SLP Short Term Goal 3 (Week 1): Patient will utilize external aids to recall new information with min assist question cues. SLP Short Term Goal 3 - Progress (Week 1): Progressing toward goal SLP Short Term Goal 4 (Week 1): Patient will solve moderately complex problems with min assist verbal cues.   SLP Short Term Goal 4 - Progress (Week 1): Progressing toward goal SLP Short Term Goal 5 (Week 1): Patient will sustain attention to functional task for 30 miutes with min assist vebral cues for re-direction. SLP Short Term Goal 5 - Progress (Week 1): Progressing toward goal  Skilled Therapeutic Interventions: Therapeutic intervention focused on attention to task, attention to left of midline, and recall. Pt able to attend to 9x9 word search activity with appropriate attention to left margins, however, pt had difficulty circling words correctly, stating "I don't usually do these". Pt exhibits decreased recall of speech therapy focus, stating ST working on "sounds". Flat affect and monotone throughout session, but willing to work with unfamiliar therapist.   FIM:  Comprehension Comprehension Mode: Auditory Comprehension: 4-Understands basic 75 - 89% of the time/requires cueing 10 - 24% of the time Expression Expression Mode: Verbal Expression: 5-Expresses basic  needs/ideas: With extra time/assistive device Social Interaction Social Interaction: 4-Interacts appropriately 75 - 89% of the time - Needs redirection for appropriate language or to initiate interaction. Problem Solving Problem Solving: 3-Solves basic 50 - 74% of the time/requires cueing 25 - 49% of the time Memory Memory: 4-Recognizes or recalls 75 - 89% of the time/requires cueing 10 - 24% of the time  Pain Pain Assessment Pain Assessment: No/denies pain Faces Pain Scale: Hurts little more Pain Location: Neck Pain Intervention(s): Medication (See eMAR)  Therapy/Group: Individual Therapy  Celia B. [place], MSP, CCC-SLP  Leigh Aurora 02/26/2013, 2:34 PM

## 2013-02-26 NOTE — Progress Notes (Signed)
Occupational Therapy Session Note  Patient Details  Name: [name] MRN: [id number] Date of Birth: April 15, 1931  Today's Date: 02/26/2013 Time: 6213-0865 Time Calculation (min): 59 min  Skilled Therapeutic Interventions/Progress Updates:    Pt seen for individual OT treatment session this am for ADL retraining, transfers, LUE neuromuscular re-education, attention, task initiation & postural control. Patient benefits from increased time to process information during ADL's. He was Mod-Max A x2 (second person for clothing negotiation and peri care) transfer from w/c-3:1. Pt pushes to left during transfers to his right and is not able to stand upright given maximum verbal and tactile cues as well as attempted blocking of knees. ?Fear of falling as pt was noted to be in squat position and reaching for armrest of w/c despite all cues given above. Pt often requires redirection to tasks, demonstrates deficits in initiation. He also required verbal, gestural, tactile & hand over hand techniques for use of left hand during functional activities. Pt was left sitting in w/c w/ safety belt on, phone and call bell in reach.  Therapy Documentation Precautions:  Precautions Precautions: Fall Precaution Comments: Lt. hemiparesis Restrictions Weight Bearing Restrictions: No     Pain: Pt reports no pain   ADL: ADL Where Assessed-Grooming: Sitting at sink;Wheelchair Upper Body Bathing: Moderate assistance Where Assessed-Upper Body Bathing: Sitting at sink Lower Body Bathing: Maximal assistance Where Assessed-Lower Body Bathing: Sitting at sink Upper Body Dressing: Maximal assistance Where Assessed-Upper Body Dressing: Sitting at sink Lower Body Dressing: Dependent Where Assessed-Lower Body Dressing: Sitting at sink Toileting: Dependent Where Assessed-Toileting: Bedside Commode Toilet Transfer: Maximal assistance Toilet Transfer Method: Squat pivot Toilet Transfer Equipment: Bedside  commode Tub/Shower Transfer: Not assessed  :   See FIM for current functional status  Therapy/Group: Individual Therapy  Alm Bustard 02/26/2013, 11:28 AM

## 2013-02-26 NOTE — Progress Notes (Signed)
Physical Therapy Note  Patient Details  Name: [name] MRN: [id number] Date of Birth: [birth year] Today's Date: 02/26/2013  4098-1191 (45 minutes) individual Pain : 4/10 low back with referred pain RT LE (hip)/ meds given Focus of treatment: Bed mobility ; transfer training; standing tolerance; Neuro re-ed LT LE Treatment: Pt in bed upon arrival ; rolling with bedrail left min/mod assist with c/o RT hip pain with RT LE in hooklying; side to sit max assist from left sidelying; transfer squat/pivot to right max assist + 1; sit to stand to standing frame mod/max assist ; standing tolerance X 2 ( 2 minutes, 7 minutes) before c/o fatigue; Neuro re-ed LT LE- AA heel slides, hip abduction, SAQs; transfer to bedside commode partial stand/pivot to right max assist + 1.   1400-1430 (30 minutes) individual Pain : 4/10 Rt anterior thigh/ premedicated Focus of treatment: Therapeutic exercises focused on bilateral LE strengthening/ activity tolerance Treatment: transfer wc to Nustep stand/turn max assist +1; Nustep Level 4 X 10 minutes LEs only; Gait 10 feet x 1 Eva walker mod assist with decreased DF on left with vcs to consistently advance LT LE; sit to stand mod/max assist from wc. Pt attempts to sit prematurely before squared to wc.    Canden Cieslinski,JIM 02/26/2013, 9:57 AM

## 2013-02-26 NOTE — Progress Notes (Signed)
VASCULAR LAB PRELIMINARY  PRELIMINARY  PRELIMINARY  PRELIMINARY  Bilateral lower extremity venous duplex completed.    Preliminary report:  Right leg negative for DVT.  Left leg is positive for a short segment of DVT in the mid posterior tibial vein.  No other DVT noted in the left leg.  NICHOLS, FRANCES, RVT 02/26/2013, 4:49 PM

## 2013-02-26 NOTE — Progress Notes (Signed)
Patient ID: [name], male   DOB: 13-Mar-1931, 79 y.o.   MRN: [id number]  Subjective/Complaints: 79 year-old male with history of CAD, CABG who was recently evaluated in the emergency room 2 weeks ago at [facility] with weakness of his left arm and left leg noted to have acute infarct. By report he was subsequently transferred to [facility] after evaluation Plavix was added to aspirin. He did not receive TPA per report. Patient noted to have seizure and was loaded with intravenous Dilantin. Paperwork was very minimal due to workup being done at [facility]. He was later transferred back to [facility] for ongoing physical therapy and left-sided weakness secondary to stroke.  The patient has knee pain and takes tramadol at home for this.  The patient has a history of neck pain but not currently complaining. MRI in 2008 demonstrating multilevel cervical stenosis C3-C7 levels  Left calf and thigh pain, no trauma Also with R sided back pain when R leg is extended History of back pain but no hx of back surgery  Review of Systems  Neurological: Positive for focal weakness. Negative for seizures.    Objective: Vital Signs: Blood pressure 129/74, pulse 74, temperature 97.4 F (36.3 C), temperature source Oral, resp. rate 17, height 5\' 7"  (1.702 m), weight 79.2 kg (174 lb 9.7 oz), SpO2 97.00%. No results found. Results for orders placed during the hospital encounter of 02/22/13 (from the past 72 hour(s))  CBC WITH DIFFERENTIAL     Status: Abnormal   Collection Time    02/23/13  8:30 AM      Result Value Range   WBC 10.0  4.0 - 10.5 K/uL   RBC 3.44 (*) 4.22 - 5.81 MIL/uL   Hemoglobin 10.2 (*) 13.0 - 17.0 g/dL   HCT 78.2 (*) 95.6 - 21.3 %   MCV 85.8  78.0 - 100.0 fL   MCH 29.7  26.0 - 34.0 pg   MCHC 34.6  30.0 - 36.0 g/dL   RDW 08.6  57.8 - 46.9 %   Platelets 258  150 - 400 K/uL   Neutrophils Relative 68  43 - 77 %   Neutro Abs 6.8  1.7 - 7.7 K/uL    Lymphocytes Relative 23  12 - 46 %   Lymphs Abs 2.3  0.7 - 4.0 K/uL   Monocytes Relative 7  3 - 12 %   Monocytes Absolute 0.7  0.1 - 1.0 K/uL   Eosinophils Relative 1  0 - 5 %   Eosinophils Absolute 0.1  0.0 - 0.7 K/uL   Basophils Relative 0  0 - 1 %   Basophils Absolute 0.0  0.0 - 0.1 K/uL  COMPREHENSIVE METABOLIC PANEL     Status: Abnormal   Collection Time    02/23/13  8:30 AM      Result Value Range   Sodium 128 (*) 135 - 145 mEq/L   Potassium 3.3 (*) 3.5 - 5.1 mEq/L   Chloride 93 (*) 96 - 112 mEq/L   CO2 24  19 - 32 mEq/L   Glucose, Bld 116 (*) 70 - 99 mg/dL   BUN 6  6 - 23 mg/dL   Creatinine, Ser 6.29  0.50 - 1.35 mg/dL   Calcium 8.9  8.4 - 52.8 mg/dL   Total Protein 6.2  6.0 - 8.3 g/dL   Albumin 3.0 (*) 3.5 - 5.2 g/dL   AST 22  0 - 37 U/L   ALT 21  0 - 53 U/L  Alkaline Phosphatase 91  39 - 117 U/L   Total Bilirubin 0.3  0.3 - 1.2 mg/dL   GFR calc non Af Amer >90  >90 mL/min   GFR calc Af Amer >90  >90 mL/min   Comment:            The eGFR has been calculated     using the CKD EPI equation.     This calculation has not been     validated in all clinical     situations.     eGFR's persistently     <90 mL/min signify     possible Chronic Kidney Disease.      Vitals reviewed.  Constitutional: He is oriented to person, place, and time. He appears well-developed.  Mood/affect flat HENT:  Head: Normocephalic.  Eyes: EOM are normal.  Neck: Neck supple. No thyromegaly present.  Cardiovascular: Normal rate and regular rhythm.  Pulmonary/Chest: Effort normal and breath sounds normal. No respiratory distress.  Abdominal: Soft. Bowel sounds are normal. He exhibits no distension.  Musculoskeletal: He exhibits no edema. No calf or thigh swelling or tenderness Neurological: He is alert and oriented to person, place, and time.  Follows three step commands  Motor strength is 5/5 in the right deltoid, biceps, triceps, grip, hip flexor, knee extensors, ankle dorsiflexor and  plantar flexor  Motor strength is 3 minus/5 in the left deltoid, biceps, triceps, grip, hip flexor, 4 minus in the knee extensor, 3 minus in ankle dorsiflexor and plantar flexor  Sensation is intact to light touch  There is evidence of tactile extinction on double simultaneous stimulation  There is evidence of left-sided neglect on visual confrontation testing  There is a right gaze preference  Verbal output is sparse but no evidence of dysarthria  Patient does have decreased sensation to light touch in the left. Midline chest incision well healed    Assessment/Plan: 1. Functional deficits secondary to R MCA distribution infarct which require 3+ hours per day of interdisciplinary therapy in a comprehensive inpatient rehab setting. Physiatrist is providing close team supervision and 24 hour management of active medical problems listed below. Physiatrist and rehab team continue to assess barriers to discharge/monitor patient progress toward functional and medical goals. FIM: FIM - Bathing Bathing Steps Patient Completed: Chest;Left Arm;Abdomen;Right upper leg;Left upper leg Bathing: 3: Mod-Patient completes 5-7 21f 10 parts or 50-74%  FIM - Upper Body Dressing/Undressing Upper body dressing/undressing steps patient completed: Thread/unthread right sleeve of pullover shirt/dresss Upper body dressing/undressing: 2: Max-Patient completed 25-49% of tasks FIM - Lower Body Dressing/Undressing Lower body dressing/undressing steps patient completed: Thread/unthread right pants leg Lower body dressing/undressing: 1: Total-Patient completed less than 25% of tasks  FIM - Toileting Toileting: 0: Activity did not occur  FIM - Diplomatic Services operational officer Devices: Psychiatrist Transfers: 0-Activity did not occur  FIM - Games developer Transfer: 1: Supine > Sit: Total A (helper does all/Pt. < 25%);1: Two helpers  FIM - Locomotion: Wheelchair Distance:  40' Locomotion: Wheelchair: 1: Travels less than 50 ft with moderate assistance (Pt: 50 - 74%) FIM - Locomotion: Ambulation Ambulation/Gait Assistance:  (attempted, pt unable secondary to weakness & Lt. lean)  Comprehension Comprehension Mode: Auditory Comprehension: 5-Follows basic conversation/direction: With no assist  Expression Expression Mode: Verbal Expression: 5-Expresses basic needs/ideas: With no assist  Social Interaction Social Interaction: 4-Interacts appropriately 75 - 89% of the time - Needs redirection for appropriate language or to initiate interaction.  Problem Solving Problem Solving Mode: Not assessed  Problem Solving: 4-Solves basic 75 - 89% of the time/requires cueing 10 - 24% of the time  Memory Memory Mode: Not assessed Memory: 4-Recognizes or recalls 75 - 89% of the time/requires cueing 10 - 24% of the time Medical Problem List and Plan:  1. Recent CVA with left-sided weakness. Will attempt to obtain workup while at [facility]  2. DVT Prophylaxis/Anticoagulation: SCDs.Add Lovenox 3. Mood: Zoloft 50 mg daily. Provide emotional support and positive reinforcement  4. Neuropsych: This patient Is capable of making decisions on his/her own behalf.  5. Pain management. Vicodin as needed for headaches  6. Seizure disorder. Dilantin 100 mg 3 times daily. Check Dilantin level and monitor for signs of seizure  7. Hypothyroidism. Synthroid 125 mcg daily. We'll check old records for thyroid studies  8. BPH history of prostate cancer. Flomax/Casodex. Check PVRs x3  9. Cervical spinal stenosis without evidence of radiculopathy  10. Bilateral knee pain osteoarthritis , continue tramadol for pain 11.  Left calf and thigh pain will check dopplers, if positive will check CT head before starting anticoagulation at full dose      LOS (Days) 4 A FACE TO FACE EVALUATION WAS PERFORMED  KIRSTEINS,ANDREW E 02/26/2013, 7:23 AM

## 2013-02-27 ENCOUNTER — Inpatient Hospital Stay (HOSPITAL_COMMUNITY): Payer: Medicare Other | Admitting: Occupational Therapy

## 2013-02-27 ENCOUNTER — Inpatient Hospital Stay (HOSPITAL_COMMUNITY): Payer: Medicare Other | Admitting: Speech Pathology

## 2013-02-27 ENCOUNTER — Inpatient Hospital Stay (HOSPITAL_COMMUNITY): Payer: Medicare Other | Admitting: *Deleted

## 2013-02-27 DIAGNOSIS — G8110 Spastic hemiplegia affecting unspecified side: Secondary | ICD-10-CM

## 2013-02-27 DIAGNOSIS — I6330 Cerebral infarction due to thrombosis of unspecified cerebral artery: Secondary | ICD-10-CM

## 2013-02-27 LAB — BASIC METABOLIC PANEL
CO2: 22 mEq/L (ref 19–32)
Calcium: 8.8 mg/dL (ref 8.4–10.5)
Creatinine, Ser: 0.51 mg/dL (ref 0.50–1.35)
Glucose, Bld: 90 mg/dL (ref 70–99)

## 2013-02-27 NOTE — Progress Notes (Signed)
Physical Therapy Session Note  Patient Details  Name: [name] MRN: [id number] Date of Birth: 09-Dec-1931  Today's Date: 02/27/2013 Time: 1100-1200 Time Calculation (min): 60 min  Skilled Therapeutic Interventions/Progress Updates:    Patient received sitting in wheelchair with significant posterior pelvic tilt and B feet off of leg rests. Patient seated in ultra low wheelchair and is seated so far anteriorly in chair, he is at risk for sliding out of chair. Switched patient's wheelchair for a higher seated 18x18 wheelchair. Today's session focused on static and dynamic sitting balance, see details below.  Patient performed scooting along edge of mat x2 lengths of the mat. Patient requires manual facilitation for anterior weight shift to assist with clearing of gluts. During squat pivot transfers to/from mat from wheelchair, patient performs with max assist going to R and unable to perform going to L without assistance from another person for safety. Patient with decreased attention to the L and poor motor planning when attempting to transfer to the L. Throughout session, patient requires max cues to attend to task as he demonstrates ability to sustain attention for 5-10" before becoming distracted (either internally or externally). Patient appears disengaged and does not interact during most of session, although completes tasks with repeated cues to do so.  Patient returned to room and left seated in wheelchair with seatbelt donned and all needs within reach. Nurse tech present to assist with eating lunch.  Therapy Documentation Precautions:  Precautions Precautions: Fall Precaution Comments: L inatention Restrictions Weight Bearing Restrictions: No Pain: Pain Assessment Pain Assessment: No/denies pain Pain Score: 0-No pain Locomotion : Ambulation Ambulation/Gait Assistance: Not tested (comment) Wheelchair Mobility Distance: 75  Balance: Balance Balance Assessed: Yes Static  Sitting Balance Static Sitting - Balance Support: No upper extremity supported;Feet supported Static Sitting - Level of Assistance: 4: Min assist Static Sitting - Comment/# of Minutes: >25 min Dynamic Sitting Balance Dynamic Sitting - Balance Support: Feet supported;No upper extremity supported;During functional activity Dynamic Sitting - Level of Assistance: 4: Min assist Dynamic Sitting - Balance Activities: Lateral lean/weight shifting;Forward lean/weight shifting;Reaching for objects;Reaching across midline Dynamic Sitting - Comments: Patient performed 2 rounds of picking up 9 cones (first round cones placed on floor on patient's L and R and second round placed on floor on patient's L). Patient instructed to place cones on stool anteriorly and to the L. Patient requires min assist throughout activity.  See FIM for current functional status  Therapy/Group: Individual Therapy  Chipper Herb. Ripa, PT, DPT  02/27/2013, 4:16 PM

## 2013-02-27 NOTE — Progress Notes (Signed)
Speech Language Pathology Daily Session Note  Patient Details  Name: [name] MRN: [id number] Date of Birth: 1931/10/02  Today's Date: 02/27/2013 Time: 1005-1045 Time Calculation (min): 40 min  Short Term Goals: Week 1: SLP Short Term Goal 1 (Week 1): Patient will attent to left environment during reading and writing task with supervision level verbal cues. SLP Short Term Goal 1 - Progress (Week 1): Progressing toward goal SLP Short Term Goal 2 (Week 1): Patient will attend to and utilize left upper extremity during functional tasks with min assist question cues. SLP Short Term Goal 2 - Progress (Week 1): Progressing toward goal SLP Short Term Goal 3 (Week 1): Patient will utilize external aids to recall new information with min assist question cues. SLP Short Term Goal 3 - Progress (Week 1): Progressing toward goal SLP Short Term Goal 4 (Week 1): Patient will solve moderately complex problems with min assist verbal cues.   SLP Short Term Goal 4 - Progress (Week 1): Progressing toward goal SLP Short Term Goal 5 (Week 1): Patient will sustain attention to functional task for 30 miutes with min assist vebral cues for re-direction. SLP Short Term Goal 5 - Progress (Week 1): Progressing toward goal  Skilled Therapeutic Interventions: Skilled therapy session focused on addressing attention to task, attention to left of midline, and recall. Patient able to attend to 9x9 word search activity (initiated during yesterday's session) with appropriate attention to left margins, however, required max assist to sustain attention to searching for words for 1 minute increments.  Patient required min assist verbal cues to recall details of medical information following discussion with PA this morning.     FIM:  Comprehension Comprehension Mode: Auditory Comprehension: 4-Understands basic 75 - 89% of the time/requires cueing 10 - 24% of the time Expression Expression Mode: Verbal Expression:  5-Expresses basic needs/ideas: With extra time/assistive device Social Interaction Social Interaction: 4-Interacts appropriately 75 - 89% of the time - Needs redirection for appropriate language or to initiate interaction. Problem Solving Problem Solving: 3-Solves basic 50 - 74% of the time/requires cueing 25 - 49% of the time Memory Memory: 4-Recognizes or recalls 75 - 89% of the time/requires cueing 10 - 24% of the time  Pain Pain Assessment Pain Assessment: No/denies pain  Therapy/Group: Individual Therapy  Charlane Ferretti., CCC-SLP [phone]  [name] 02/27/2013, 12:26 PM

## 2013-02-27 NOTE — Progress Notes (Signed)
Patient ID: [name], male   DOB: 1931-04-12, 79 y.o.   MRN: [id number]  Subjective/Complaints: 79 year-old male with history of CAD, CABG who was recently evaluated in the emergency room 2 weeks ago at [facility] with weakness of his left arm and left leg noted to have acute infarct. By report he was subsequently transferred to [facility] after evaluation Plavix was added to aspirin. He did not receive TPA per report. Patient noted to have seizure and was loaded with intravenous Dilantin. Paperwork was very minimal due to workup being done at [facility]. He was later transferred back to [facility] for ongoing physical therapy and left-sided weakness secondary to stroke.  The patient has knee pain and takes tramadol at home for this.  The patient has a history of neck pain but not currently complaining. MRI in 2008 demonstrating multilevel cervical stenosis C3-C7 levels  Left calf and thigh pain, no changes Also with R sided back pain when R leg is extended No CP or SOB Review of Systems  Neurological: Positive for focal weakness. Negative for seizures.    Objective: Vital Signs: Blood pressure 145/89, pulse 76, temperature 98.5 F (36.9 C), temperature source Oral, resp. rate 16, height 5\' 7"  (1.702 m), weight 79.2 kg (174 lb 9.7 oz), SpO2 96.00%. No results found. Results for orders placed during the hospital encounter of 02/22/13 (from the past 72 hour(s))  BASIC METABOLIC PANEL     Status: Abnormal   Collection Time    02/26/13  6:45 AM      Result Value Range   Sodium 123 (*) 135 - 145 mEq/L   Potassium 4.5  3.5 - 5.1 mEq/L   Chloride 93 (*) 96 - 112 mEq/L   CO2 20  19 - 32 mEq/L   Glucose, Bld 93  70 - 99 mg/dL   BUN 7  6 - 23 mg/dL   Creatinine, Ser 4.09 (*) 0.50 - 1.35 mg/dL   Calcium 8.8  8.4 - 81.1 mg/dL   GFR calc non Af Amer >90  >90 mL/min   GFR calc Af Amer >90  >90 mL/min   Comment:            The eGFR has been calculated      using the CKD EPI equation.     This calculation has not been     validated in all clinical     situations.     eGFR's persistently     <90 mL/min signify     possible Chronic Kidney Disease.      Vitals reviewed.  Constitutional: He is oriented to person, place, and time. He appears well-developed.  Mood/affect flat HENT:  Head: Normocephalic.  Eyes: EOM are normal.  Neck: Neck supple. No thyromegaly present.  Cardiovascular: Normal rate and regular rhythm.  Pulmonary/Chest: Effort normal and breath sounds normal. No respiratory distress.  Abdominal: Soft. Bowel sounds are normal. He exhibits no distension.  Musculoskeletal: He exhibits no edema. No calf or thigh swelling or tenderness Neurological: He is alert and oriented to person, place, and time.  Follows three step commands  Motor strength is 5/5 in the right deltoid, biceps, triceps, grip, hip flexor, knee extensors, ankle dorsiflexor and plantar flexor  Motor strength is 3 minus/5 in the left deltoid, biceps, triceps, grip, hip flexor, 4 minus in the knee extensor, 3 minus in ankle dorsiflexor and plantar flexor  Sensation is intact to light touch  There is evidence of tactile extinction on  double simultaneous stimulation  There is evidence of left-sided neglect on visual confrontation testing  There is a right gaze preference  Verbal output is sparse but no evidence of dysarthria  Patient does have decreased sensation to light touch in the left. Midline chest incision well healed    Assessment/Plan: 1. Functional deficits secondary to R MCA distribution infarct which require 3+ hours per day of interdisciplinary therapy in a comprehensive inpatient rehab setting. Physiatrist is providing close team supervision and 24 hour management of active medical problems listed below. Physiatrist and rehab team continue to assess barriers to discharge/monitor patient progress toward functional and medical goals. FIM: FIM -  Bathing Bathing Steps Patient Completed: Chest;Left Arm;Right Arm;Abdomen;Right upper leg;Left upper leg Bathing: 3: Mod-Patient completes 5-7 49f 10 parts or 50-74%  FIM - Upper Body Dressing/Undressing Upper body dressing/undressing steps patient completed: Thread/unthread right sleeve of pullover shirt/dresss;Thread/unthread left sleeve of pullover shirt/dress;Put head through opening of pull over shirt/dress;Pull shirt over trunk Upper body dressing/undressing: 2: Max-Patient completed 25-49% of tasks FIM - Lower Body Dressing/Undressing Lower body dressing/undressing steps patient completed: Thread/unthread right pants leg;Thread/unthread left pants leg;Pull pants up/down;Don/Doff right shoe;Don/Doff left shoe Lower body dressing/undressing: 1: Total-Patient completed less than 25% of tasks  FIM - Toileting Toileting: 1: Two helpers (Second helper to adjust clothing & peri care)  FIM - Diplomatic Services operational officer Devices: Bedside commode Toilet Transfers: 1-To toilet/BSC: Total A (helper does all/Pt. < 25%);1-Two helpers (Second helper for peri care and Equities trader)  FIM - Games developer Transfer: 1: Supine > Sit: Total A (helper does all/Pt. < 25%);1: Two helpers  FIM - Locomotion: Wheelchair Distance: 45 Locomotion: Wheelchair: 1: Travels less than 50 ft with minimal assistance (Pt.>75%) FIM - Locomotion: Ambulation Ambulation/Gait Assistance:  (attempted, pt unable secondary to weakness & Lt. lean) Locomotion: Ambulation: 0: Activity did not occur  Comprehension Comprehension Mode: Auditory Comprehension: 4-Understands basic 75 - 89% of the time/requires cueing 10 - 24% of the time  Expression Expression Mode: Verbal Expression: 5-Expresses basic needs/ideas: With no assist  Social Interaction Social Interaction: 4-Interacts appropriately 75 - 89% of the time - Needs redirection for appropriate language or to initiate  interaction.  Problem Solving Problem Solving Mode: Not assessed Problem Solving: 3-Solves basic 50 - 74% of the time/requires cueing 25 - 49% of the time  Memory Memory Mode: Not assessed Memory: 4-Recognizes or recalls 75 - 89% of the time/requires cueing 10 - 24% of the time Medical Problem List and Plan:  1. Recent CVA with left-sided weakness. Will attempt to obtain workup while at [facility]  2. DVT Prophylaxis/Anticoagulation: SCDs.Lovenox BID 3. Mood: Zoloft 50 mg daily. Provide emotional support and positive reinforcement  4. Neuropsych: This patient Is capable of making decisions on his/her own behalf.  5. Pain management. Vicodin as needed for headaches  6. Seizure disorder. Dilantin 100 mg 3 times daily. Check Dilantin level and monitor for signs of seizure  7. Hypothyroidism. Synthroid 125 mcg daily. We'll check old records for thyroid studies  8. BPH history of prostate cancer. Flomax/Casodex. Check PVRs x3  9. Cervical spinal stenosis without evidence of radiculopathy  10. Bilateral knee pain osteoarthritis , continue tramadol for pain 11.  Left calf and thigh pain will check dopplers,Short segment post tibial DVT, no need for anti coag will monitor with serial doppler increase lovenox to 30mg  BID and check Duplex Friday     LOS (Days) 5 A FACE TO FACE EVALUATION WAS PERFORMED  Erick Colace 02/27/2013, 7:41 AM

## 2013-02-27 NOTE — Progress Notes (Addendum)
Occupational Therapy Session Note  Patient Details  Name: [name] MRN: [id number] Date of Birth: 1931/07/22  Today's Date: 02/27/2013 Time: [phone] and [id number] Time Calculation (min): 58 min and 30 min  Short Term Goals: Week 1:  OT Short Term Goal 1 (Week 1): Pt. will maintain midline control during grooming with nimial cues for 5 minutes OT Short Term Goal 2 (Week 1): Pt. will utilized LUE during dressing UB with minimal assist OT Short Term Goal 3 (Week 1): Pt. will dress UB with minimal assist OT Short Term Goal 4 (Week 1): Pt. will dress LB with moderate assist OT Short Term Goal 5 (Week 1): Pt. will transfer to toilet with max assist  Skilled Therapeutic Interventions/Progress Updates:    1) Pt seen for ADL retraining with focus on bed mobility, transfers, sit <> stand, and attention to task, initiation, and postural control.  Patient benefits from increased time to process information during ADLs.  Completed perineal hygiene at bed level with focus on rolling Rt and Lt, pt required increased time for processing verbal cues and hand over hand assist to initiate movement with rolling.  Max assist +2 with squat pivot transfer to w/c to Rt due to pushing.  Pt reports fearfulness with transfers.  Engaged in LB dressing at sit <> stand level at sink with +2 for safety.  Pt with increased upright standing posture with UE support on sink.  Pt able to incorporate LUE into functional tasks with gestural and tactile cues.    2)  Pt seen for 1:1 OT with focus on bed mobility, static and dynamic sitting balance, and attention to Lt.  Upon arrival, pt reports need to urinate, requesting use of urinal.  Engaged in sidelying to sitting on EOB to increase ability to urinate and turned on water to assist.  Pt reports difficulty with urination after holding it.  Pt with lateral lean in sitting, engaged in lateral leans and scooting to promote improved sitting balance.  Reaching activity to Lt to  increase attention to Lt environment and LUE, with pt reaching with Lt hand.  Pt able to grasp item with Lt hand, however unable to bring it up to mouth without assist of RUE.   Therapy Documentation Precautions:  Precautions Precautions: Fall Precaution Comments: L inatention Restrictions Weight Bearing Restrictions: No Pain: Pain Assessment Pain Score:   2  See FIM for current functional status  Therapy/Group: Individual Therapy  [name] 02/27/2013, 9:43 AM

## 2013-02-28 ENCOUNTER — Encounter (HOSPITAL_COMMUNITY): Payer: Medicare Other | Admitting: Occupational Therapy

## 2013-02-28 ENCOUNTER — Inpatient Hospital Stay (HOSPITAL_COMMUNITY): Payer: Medicare Other | Admitting: Occupational Therapy

## 2013-02-28 ENCOUNTER — Inpatient Hospital Stay (HOSPITAL_COMMUNITY): Payer: Medicare Other | Admitting: Speech Pathology

## 2013-02-28 ENCOUNTER — Inpatient Hospital Stay (HOSPITAL_COMMUNITY): Payer: Medicare Other | Admitting: Physical Therapy

## 2013-02-28 NOTE — Progress Notes (Signed)
Occupational Therapy Session Note  Patient Details  Name: [name] MRN: [id number] Date of Birth: [birth year]  Today's Date: 02/28/2013 Time: 0272-5366 Time Calculation (min): 42 min  Short Term Goals: Week 1:  OT Short Term Goal 1 (Week 1): Pt. will maintain midline control during grooming with nimial cues for 5 minutes OT Short Term Goal 2 (Week 1): Pt. will utilized LUE during dressing UB with minimal assist OT Short Term Goal 3 (Week 1): Pt. will dress UB with minimal assist OT Short Term Goal 4 (Week 1): Pt. will dress LB with moderate assist OT Short Term Goal 5 (Week 1): Pt. will transfer to toilet with max assist  Skilled Therapeutic Interventions/Progress Updates:    Pt seen for 1:1 OT with focus on Lt attention, scanning to Lt, and functional use of LUE.  Engaged in card activity with focus on alternating attention, scanning, and use of Lt hand with grasping, flipping, and releasing cards with discard pile to pt's left.  Pt required assistance from Rt hand to aid in flipping over cards as unable (?unwilling) to attempt with just Lt hand.  Increased challenge by moving discard pile further to Lt to increase weight shifting with reaching.  Use of cups with stacking, again with focus on grasp and release with functional use of Lt hand.  Pt easily distracted in min distracting environment.  Pt propelled w/c approx 50 feet back to room using hemi-technique with verbal cues to attend to Lt side as pt would run into wall without cues.    Therapy Documentation Precautions:  Precautions Precautions: Fall Precaution Comments: L inatention Restrictions Weight Bearing Restrictions: No Pain: Pain Assessment Pain Assessment: No/denies pain Pain Score: 0-No pain  See FIM for current functional status  Therapy/Group: Individual Therapy  [name] 02/28/2013, 3:22 PM

## 2013-02-28 NOTE — Progress Notes (Signed)
Occupational Therapy Session Note  Patient Details  Name: [name] MRN: [id number] Date of Birth: [birth year]  Today's Date: 02/28/2013 Time: 0900-1002 Time Calculation (min): 62 min  Short Term Goals: Week 1:  OT Short Term Goal 1 (Week 1): Pt. will maintain midline control during grooming with nimial cues for 5 minutes OT Short Term Goal 2 (Week 1): Pt. will utilized LUE during dressing UB with minimal assist OT Short Term Goal 3 (Week 1): Pt. will dress UB with minimal assist OT Short Term Goal 4 (Week 1): Pt. will dress LB with moderate assist OT Short Term Goal 5 (Week 1): Pt. will transfer to toilet with max assist  Skilled Therapeutic Interventions/Progress Updates:    Patient seen this am to address functional mobility, decreased response time, improved attention to left side of body, and neuromuscular re-education to left side within context of basic self care activity.  Patient with strong posterior bias, and resistant to weight shift forward over feet as needed for transfers and transition sit to stand.  Patient did well with structural support, grab bar, as context for forward lean, and then could stand step around to toilet, standing long enough for clothing manipulation and hygiene.  Patient with spontaneous attempts to utilize left upper extremity throughout this session.  Limited by elbow tightness.    Therapy Documentation Precautions:  Precautions Precautions: Fall Precaution Comments: L inatention Restrictions Weight Bearing Restrictions: No     Pain:  3/10 in back. Patient reports already having taken pain medicine.  Reports uncomfortable from lying on it all night.  Assisted patient to sitting and to support of wheelchair.  Patient reports improvement in back pain.    See FIM for current functional status  Therapy/Group: Individual Therapy  Collier Salina 02/28/2013, 10:10 AM

## 2013-02-28 NOTE — Progress Notes (Signed)
Patient ID: [name], male   DOB: 02-04-1931, 79 y.o.   MRN: [id number]  Subjective/Complaints: 79 year-old male with history of CAD, CABG who was recently evaluated in the emergency room 2 weeks ago at [facility] with weakness of his left arm and left leg noted to have acute infarct. By report he was subsequently transferred to [facility] after evaluation Plavix was added to aspirin. He did not receive TPA per report. Patient noted to have seizure and was loaded with intravenous Dilantin. Paperwork was very minimal due to workup being done at [facility]. He was later transferred back to [facility] for ongoing physical therapy and left-sided weakness secondary to stroke.  The patient has knee pain and takes tramadol at home for this.  The patient has a history of neck pain but not currently complaining. MRI in 2008 demonstrating multilevel cervical stenosis C3-C7 levels  Left calf and thigh pain, no changes Also with R sided back pain when R leg is extended No CP or SOB Review of Systems  Neurological: Positive for focal weakness. Negative for seizures.    Objective: Vital Signs: Blood pressure 136/76, pulse 73, temperature 97.4 F (36.3 C), temperature source Oral, resp. rate 17, height 5\' 7"  (1.702 m), weight 79.2 kg (174 lb 9.7 oz), SpO2 100.00%. No results found. Results for orders placed during the hospital encounter of 02/22/13 (from the past 72 hour(s))  BASIC METABOLIC PANEL     Status: Abnormal   Collection Time    02/26/13  6:45 AM      Result Value Range   Sodium 123 (*) 135 - 145 mEq/L   Potassium 4.5  3.5 - 5.1 mEq/L   Chloride 93 (*) 96 - 112 mEq/L   CO2 20  19 - 32 mEq/L   Glucose, Bld 93  70 - 99 mg/dL   BUN 7  6 - 23 mg/dL   Creatinine, Ser 1.30 (*) 0.50 - 1.35 mg/dL   Calcium 8.8  8.4 - 86.5 mg/dL   GFR calc non Af Amer >90  >90 mL/min   GFR calc Af Amer >90  >90 mL/min   Comment:            The eGFR has been calculated      using the CKD EPI equation.     This calculation has not been     validated in all clinical     situations.     eGFR's persistently     <90 mL/min signify     possible Chronic Kidney Disease.  BASIC METABOLIC PANEL     Status: Abnormal   Collection Time    02/27/13  6:45 AM      Result Value Range   Sodium 127 (*) 135 - 145 mEq/L   Potassium 4.2  3.5 - 5.1 mEq/L   Chloride 94 (*) 96 - 112 mEq/L   CO2 22  19 - 32 mEq/L   Glucose, Bld 90  70 - 99 mg/dL   BUN 8  6 - 23 mg/dL   Creatinine, Ser 7.84  0.50 - 1.35 mg/dL   Calcium 8.8  8.4 - 69.6 mg/dL   GFR calc non Af Amer >90  >90 mL/min   GFR calc Af Amer >90  >90 mL/min   Comment:            The eGFR has been calculated     using the CKD EPI equation.     This calculation has not been  validated in all clinical     situations.     eGFR's persistently     <90 mL/min signify     possible Chronic Kidney Disease.      Vitals reviewed.  Constitutional: He is oriented to person, place, and time. He appears well-developed.  Mood/affect flat HENT:  Head: Normocephalic.  Eyes: EOM are normal.  Neck: Neck supple. No thyromegaly present.  Cardiovascular: Normal rate and regular rhythm.  Pulmonary/Chest: Effort normal and breath sounds normal. No respiratory distress.  Abdominal: Soft. Bowel sounds are normal. He exhibits no distension.  Musculoskeletal: He exhibits no edema. No calf or thigh swelling or tenderness, neg Homan's Neurological: He is alert and oriented to person, place, and time.  Follows three step commands  Motor strength is 5/5 in the right deltoid, biceps, triceps, grip, hip flexor, knee extensors, ankle dorsiflexor and plantar flexor  Motor strength is 3 minus/5 in the left deltoid, biceps, triceps, grip, hip flexor, 4 minus in the knee extensor, 3 minus in ankle dorsiflexor and plantar flexor  Sensation is intact to light touch  There is evidence of tactile extinction on double simultaneous stimulation  There  is evidence of left-sided neglect on visual confrontation testing  There is a right gaze preference  Verbal output is sparse but no evidence of dysarthria  Patient does have decreased sensation to light touch in the left. Midline chest incision well healed    Assessment/Plan: 1. Functional deficits secondary to R MCA distribution infarct which require 3+ hours per day of interdisciplinary therapy in a comprehensive inpatient rehab setting. Physiatrist is providing close team supervision and 24 hour management of active medical problems listed below. Physiatrist and rehab team continue to assess barriers to discharge/monitor patient progress toward functional and medical goals. FIM: FIM - Bathing Bathing Steps Patient Completed: Chest;Right Arm;Left Arm;Abdomen;Right upper leg;Front perineal area;Left upper leg Bathing: 3: Mod-Patient completes 5-7 19f 10 parts or 50-74%  FIM - Upper Body Dressing/Undressing Upper body dressing/undressing steps patient completed: Thread/unthread right sleeve of pullover shirt/dresss;Thread/unthread left sleeve of pullover shirt/dress;Put head through opening of pull over shirt/dress Upper body dressing/undressing: 3: Mod-Patient completed 50-74% of tasks FIM - Lower Body Dressing/Undressing Lower body dressing/undressing steps patient completed: Thread/unthread right pants leg;Thread/unthread left pants leg;Pull pants up/down;Don/Doff right shoe;Don/Doff left shoe Lower body dressing/undressing: 1: Total-Patient completed less than 25% of tasks  FIM - Toileting Toileting: 1: Two helpers (Second helper to adjust clothing & peri care)  FIM - Diplomatic Services operational officer Devices: Bedside commode Toilet Transfers: 1-To toilet/BSC: Total A (helper does all/Pt. < 25%);1-Two helpers (Second helper for peri care and Equities trader)  FIM - Banker Devices: Arm rests Bed/Chair Transfer: 1: Two  helpers  FIM - Locomotion: Wheelchair Distance: 75 Locomotion: Wheelchair: 2: Travels 50 - 149 ft with minimal assistance (Pt.>75%) FIM - Locomotion: Ambulation Ambulation/Gait Assistance: Not tested (comment) Locomotion: Ambulation: 0: Activity did not occur  Comprehension Comprehension Mode: Auditory Comprehension: 4-Understands basic 75 - 89% of the time/requires cueing 10 - 24% of the time  Expression Expression Mode: Verbal Expression: 5-Expresses basic 90% of the time/requires cueing < 10% of the time.  Social Interaction Social Interaction: 4-Interacts appropriately 75 - 89% of the time - Needs redirection for appropriate language or to initiate interaction.  Problem Solving Problem Solving Mode: Not assessed Problem Solving: 3-Solves basic 50 - 74% of the time/requires cueing 25 - 49% of the time  Memory Memory Mode: Not assessed Memory: 4-Recognizes or  recalls 75 - 89% of the time/requires cueing 10 - 24% of the time Medical Problem List and Plan:  1. Recent CVA with left-sided weakness. Will attempt to obtain workup while at [facility]  2. DVT Prophylaxis/Anticoagulation: SCDs.Lovenox BID 3. Mood: Zoloft 50 mg daily. Provide emotional support and positive reinforcement  4. Neuropsych: This patient Is capable of making decisions on his/her own behalf.  5. Pain management. Vicodin as needed for headaches  6. Seizure disorder. Dilantin 100 mg 3 times daily. Check Dilantin level and monitor for signs of seizure  7. Hypothyroidism. Synthroid 125 mcg daily. We'll check old records for thyroid studies  8. BPH history of prostate cancer. Flomax/Casodex. Check PVRs x3  9. Cervical spinal stenosis without evidence of radiculopathy  10. Bilateral knee pain osteoarthritis , continue tramadol for pain 11.  Left calf and thigh pain will check dopplers,Short segment post tibial DVT, no need for anti coag will monitor with serial doppler increase lovenox to 30mg  BID and check  Duplex Friday     LOS (Days) 6 A FACE TO FACE EVALUATION WAS PERFORMED  KIRSTEINS,ANDREW E 02/28/2013, 7:21 AM

## 2013-02-28 NOTE — Progress Notes (Signed)
Physical Therapy Session Note  Patient Details  Name: [name] MRN: [id number] Date of Birth: [birth year]  Today's Date: 02/28/2013 Time: 4098-1191 Time Calculation (min): 57 min  Therapy Documentation Precautions:  Precautions Precautions: Fall Precaution Comments: L inatention Restrictions Weight Bearing Restrictions: No Vital Signs: Therapy Vitals Temp: 98.5 F (36.9 C) Temp src: Oral Pulse Rate: 86 Resp: 18 BP: 131/78 mmHg Patient Position, if appropriate: Sitting Oxygen Therapy SpO2: 96 % O2 Device: None (Room air) Pain: Pain Assessment Pain Assessment: 0-10 Pain Score:   4 Pain Type: Chronic pain Pain Location: Back Pain Orientation: Lower Pain Descriptors: Discomfort Pain Onset: Gradual Pain Intervention(s): Repositioned Mobility:  Performed w/c <> mat transfers to L and R with squat pivot and UE support on arm rest and mat with focus on trunk elongation during full anterior lean and maintaining anterior lean during hip and knee extension with max A to maintain anterior lean, lifting assistance, assistance for full pivot and lowering assistance.  Patient very fearful of translating weight forwards. Locomotion : Wheelchair Mobility Distance: 150 with RLE propulsion with mod A overall to maintain straight navigation and verbal cues to attend to L environment and obstacle negotiation.   Other Treatments: Treatments Neuromuscular Facilitation: Lower Extremity;Left;Upper Extremity;Activity to increase motor control;Activity to increase timing and sequencing;Activity to increase sustained activation;Activity to increase lateral weight shifting;Activity to increase anterior-posterior weight shifting in sitting with use of sheet around low back to facilitate anterior pelvic tilt, lumbar lordosis, trunk elongation during reaching alternating R and LUE up, forwards and laterally progressing to bilat UE reaching forwards to therapist's shoulders to further facilitate trunk  elongation and lateral trunk excursion to translate COG over BOS; transitioned to standing x 2 reps with bilat UE support on back of w/c with focus on upright trunk posture, terminal hip extension to bring COG over BOS, lateral weight shifting to advance R and LLE forwards and backwards with patient having increased difficulty maintaining weight over RLE to advance LLE; attempted to facilitate R lateral weight shift and extension through LLE reaching with RUE up, forwards and laterally to R to touch tall target with mod A overall.  Patient would sit suddenly and reporting fatigue in knees and fear of falling.  Will continue to address.    See FIM for current functional status  Therapy/Group: Individual Therapy  [name] [facility] 02/28/2013, 5:16 PM

## 2013-02-28 NOTE — Patient Care Conference (Signed)
Inpatient RehabilitationTeam Conference and Plan of Care Update Date: 02/28/2013   Time: 11:00 AM    Patient Name: [name]      Medical Record Number: [id number]  Date of Birth: 01-01-1931 Sex: Male         Room/Bed: 4005/4005-01 Payor Info: Payor: Advertising copywriter MEDICARE  Plan: AARP MEDICARE COMPLETE  Product Type: *No Product type*     Admitting Diagnosis: rt cva seizures  Admit Date/Time:  02/22/2013  1:06 PM Admission Comments: No comment available   Primary Diagnosis:  CVA (cerebral infarction) Principal Problem: CVA (cerebral infarction)  Patient Active Problem List   Diagnosis Date Noted  . Prostate cancer   . CVA (cerebral infarction) 02/22/2013  . Spinal stenosis in cervical region 02/22/2013  . Bilateral chronic knee pain 02/22/2013  . Carotid bruit 11/15/2012  . CORONARY ATHEROSCLEROSIS NATIVE CORONARY ARTERY 01/01/2011  . MIXED HYPERLIPIDEMIA 11/26/2010  . ELEVATED BLOOD PRESSURE 11/26/2010    Expected Discharge Date: Expected Discharge Date: 03/22/13  Team Members Present: Physician leading conference: Dr. Claudette Laws Social Worker Present: Dossie Der, LCSW Nurse Present: Laural Roes, RN PT Present: [name], PT;Caroline Adriana Simas, PT;Other (comment) Clarisse Gouge ripa-PT) OT Present: Bretta Bang, Verlene Mayer, OT SLP Present: Fae Pippin, SLP Other (Discipline and Name): Charolette Child Coordinator     Current Status/Progress Goal Weekly Team Focus  Medical   Small left posterior tibial DVT  Monitor for extension of DVT  Increased Lovenox, recheck Doppler duplex on 03/02/2013   Bowel/Bladder   Can be incontient at times bowel/bladder- spills urinal at times.  min assist      Swallow/Nutrition/ Hydration     na        ADL's   mod assist bathing and UB dressing, total assist LB dressing, +2 squat pivot transfers secondary to pushing  min assist overall, supervision UB dressing and grooming  Lt attention, functional use of LUE, transfers,  activity tolerance   Mobility   +2 to stand, no ambulation yet.  Mod-max A transfers, sitting balance  supervision-min A w/c level; mod A ambulation very short distances  Trunk control, sitting balance, attention to task, transfers   Communication     na        Safety/Cognition/ Behavioral Observations  mod assist   supervision   increase sustained attention, awareness, self monitoring and correcting   Pain   Ultram 50mg  Q6 hrs scheduled  pain < or = 2      Skin   Bruising to bilateral arms/hands, skin tear to bilateral elbows with allevyn dsg  no new skin breakdown         *See Interdisciplinary Assessment and Plan and progress notes for long and short-term goals  Barriers to Discharge: See above    Possible Resolutions to Barriers:  See above    Discharge Planning/Teaching Needs:  Pt plans to hire 24 hr care for home.  Daughter to over-see this      Team Discussion:  Goals min/mod level, inattention to left.  Decreased attention, initaitation and re-call.  Neuro-psych to eval tomorrow  Revisions to Treatment Plan:  None   Continued Need for Acute Rehabilitation Level of Care: The patient requires daily medical management by a physician with specialized training in physical medicine and rehabilitation for the following conditions: Daily direction of a multidisciplinary physical rehabilitation program to ensure safe treatment while eliciting the highest outcome that is of practical value to the patient.: Yes Daily medical management of patient stability for increased activity during participation  in an intensive rehabilitation regime.: Yes Daily analysis of laboratory values and/or radiology reports with any subsequent need for medication adjustment of medical intervention for : Other;Neurological problems  Lucy Chris 03/01/2013, 8:54 AM

## 2013-02-28 NOTE — Progress Notes (Signed)
Social Work Patient ID: [name], male   DOB: 1931-05-06, 79 y.o.   MRN: [id number] Met with pt and spoke with daughter via telephone to inform of team conference goals-min/mod level of assist and discharge 3/27. Daughter reported she will be there with him and they may need to hire part time assist.  Informed daughter pt is saying he plans to hire 24 hour caregivers At discharge.  Encouraged daughter to speak with dad and her brother to come up with a definite plan.  Informed daughter would need her to come in closer to Pt's discharge to learn his care.  She reports his housekeeper may also assist.  She will get with pt and let worker know what they decide.  Aware of discharge Date and work toward this.

## 2013-02-28 NOTE — Progress Notes (Signed)
Speech Language Pathology Daily Session Note  Patient Details  Name: [name] MRN: [id number] Date of Birth: 07-12-1931  Today's Date: 02/28/2013 Time: 1500-1530 Time Calculation (min): 30 min  Short Term Goals: Week 1: SLP Short Term Goal 1 (Week 1): Patient will attent to left environment during reading and writing task with supervision level verbal cues. SLP Short Term Goal 1 - Progress (Week 1): Progressing toward goal SLP Short Term Goal 2 (Week 1): Patient will attend to and utilize left upper extremity during functional tasks with min assist question cues. SLP Short Term Goal 2 - Progress (Week 1): Progressing toward goal SLP Short Term Goal 3 (Week 1): Patient will utilize external aids to recall new information with min assist question cues. SLP Short Term Goal 3 - Progress (Week 1): Progressing toward goal SLP Short Term Goal 4 (Week 1): Patient will solve moderately complex problems with min assist verbal cues.   SLP Short Term Goal 4 - Progress (Week 1): Progressing toward goal SLP Short Term Goal 5 (Week 1): Patient will sustain attention to functional task for 30 miutes with min assist vebral cues for re-direction. SLP Short Term Goal 5 - Progress (Week 1): Progressing toward goal  Skilled Therapeutic Interventions: Skilled treatment session focused on addressing cognitive goals. SLP facilitated session by providing max assist demonstration and visual cues faded to min assist to sustain attention and scan to the left side of a visuospatial tasks.  Patient required min assist verbal cues to utilize strategies to alternating scanning/attention between different objects.    Pain Pain Assessment Pain Assessment: No/denies pain Pain Score: 0-No pain  Therapy/Group: Individual Therapy  Charlane Ferretti., CCC-SLP [phone]  [name],[name] 02/28/2013, 4:32 PM

## 2013-03-01 ENCOUNTER — Inpatient Hospital Stay (HOSPITAL_COMMUNITY): Payer: Medicare Other | Admitting: Occupational Therapy

## 2013-03-01 ENCOUNTER — Inpatient Hospital Stay (HOSPITAL_COMMUNITY): Payer: Medicare Other | Admitting: *Deleted

## 2013-03-01 ENCOUNTER — Inpatient Hospital Stay (HOSPITAL_COMMUNITY): Payer: Medicare Other | Admitting: Speech Pathology

## 2013-03-01 DIAGNOSIS — G8110 Spastic hemiplegia affecting unspecified side: Secondary | ICD-10-CM

## 2013-03-01 DIAGNOSIS — I6330 Cerebral infarction due to thrombosis of unspecified cerebral artery: Secondary | ICD-10-CM

## 2013-03-01 LAB — BASIC METABOLIC PANEL
BUN: 5 mg/dL — ABNORMAL LOW (ref 6–23)
Calcium: 9 mg/dL (ref 8.4–10.5)
GFR calc non Af Amer: >90 mL/min (ref >90)
Glucose, Bld: 88 mg/dL (ref 70–99)
Sodium: 130 mEq/L — ABNORMAL LOW (ref 135–145)

## 2013-03-01 NOTE — Progress Notes (Signed)
Occupational Therapy Session Note  Patient Details  Name: [name] MRN: [id number] Date of Birth: 10-12-1931  Today's Date: 03/01/2013 Time: 1030-1130 Time Calculation (min): 60 min  Short Term Goals: Week 1:  OT Short Term Goal 1 (Week 1): Pt. will maintain midline control during grooming with nimial cues for 5 minutes OT Short Term Goal 2 (Week 1): Pt. will utilized LUE during dressing UB with minimal assist OT Short Term Goal 3 (Week 1): Pt. will dress UB with minimal assist OT Short Term Goal 4 (Week 1): Pt. will dress LB with moderate assist OT Short Term Goal 5 (Week 1): Pt. will transfer to toilet with max assist  Skilled Therapeutic Interventions/Progress Updates:    Pt seen for ADL retraining with focus on functional mobility, transfers, attention to Lt side of body, and functional use of LUE with self-care tasks.  Max assist squat pivot transfer to Rt to w/c secondary to pt's posterior bias and reluctance to shift weight forward to assist in transfers and sit <> stand.  Pt reported need to toilet, stand pivot transfer to Rt to toilet with use of grab bar with pt taking small steps to pivot to toilet with pt with increased participation due to security with use of grab bar.  Engaged in bathing and dressing at sit <> stand level at sink with pt using sink for UE support in standing, however pt reports "not as good as grab bar".  Hand over hand assist to increase use of LUE in opening containers and applying toothpaste.    Therapy Documentation Precautions:  Precautions Precautions: Fall Precaution Comments: L inatention Restrictions Weight Bearing Restrictions: No Pain: Pain Assessment Pain Assessment: 0-10 Pain Score:   3 Pain Type: Chronic pain Pain Location: Back Pain Descriptors: Aching Patients Stated Pain Goal: 2 Pain Intervention(s): Medication (See eMAR)  See FIM for current functional status  Therapy/Group: Individual Therapy  [name] 03/01/2013,  12:12 PM

## 2013-03-01 NOTE — Progress Notes (Signed)
Patient ID: [name], male   DOB: May 09, 1931, 79 y.o.   MRN: [id number]  Subjective/Complaints: 79 year-old male with history of CAD, CABG who was recently evaluated in the emergency room 2 weeks ago at [facility] with weakness of his left arm and left leg noted to have acute infarct. By report he was subsequently transferred to [facility] after evaluation Plavix was added to aspirin. He did not receive TPA per report. Patient noted to have seizure and was loaded with intravenous Dilantin. Paperwork was very minimal due to workup being done at [facility]. He was later transferred back to [facility] for ongoing physical therapy and left-sided weakness secondary to stroke.  The patient has knee pain and takes tramadol at home for this.  The patient has a history of neck pain but not currently complaining. MRI in 2008 demonstrating multilevel cervical stenosis C3-C7 levels  Left calf and thigh pain, no changes Also with R sided back pain when R leg is extended No CP or SOB Review of Systems  Neurological: Positive for focal weakness. Negative for seizures.    Objective: Vital Signs: Blood pressure 167/83, pulse 73, temperature 97.6 F (36.4 C), temperature source Oral, resp. rate 20, height 5\' 7"  (1.702 m), weight 79.2 kg (174 lb 9.7 oz), SpO2 100.00%. No results found. Results for orders placed during the hospital encounter of 02/22/13 (from the past 72 hour(s))  BASIC METABOLIC PANEL     Status: Abnormal   Collection Time    02/27/13  6:45 AM      Result Value Range   Sodium 127 (*) 135 - 145 mEq/L   Potassium 4.2  3.5 - 5.1 mEq/L   Chloride 94 (*) 96 - 112 mEq/L   CO2 22  19 - 32 mEq/L   Glucose, Bld 90  70 - 99 mg/dL   BUN 8  6 - 23 mg/dL   Creatinine, Ser 5.78  0.50 - 1.35 mg/dL   Calcium 8.8  8.4 - 46.9 mg/dL   GFR calc non Af Amer >90  >90 mL/min   GFR calc Af Amer >90  >90 mL/min   Comment:            The eGFR has been calculated     using  the CKD EPI equation.     This calculation has not been     validated in all clinical     situations.     eGFR's persistently     <90 mL/min signify     possible Chronic Kidney Disease.      Vitals reviewed.  Constitutional: He is oriented to person, place, and time. He appears well-developed.  Mood/affect flat HENT:  Head: Normocephalic.  Eyes: EOM are normal.  Neck: Neck supple. No thyromegaly present.  Cardiovascular: Normal rate and regular rhythm.  Pulmonary/Chest: Effort normal and breath sounds normal. No respiratory distress.  Abdominal: Soft. Bowel sounds are normal. He exhibits no distension.  Musculoskeletal: He exhibits no edema. No calf or thigh swelling or tenderness, neg Homan's Neurological: He is alert and oriented to person, place, and time.  Follows three step commands  Motor strength is 5/5 in the right deltoid, biceps, triceps, grip, hip flexor, knee extensors, ankle dorsiflexor and plantar flexor  Motor strength is 3 minus/5 in the left deltoid, biceps, triceps, grip, hip flexor, 4 minus in the knee extensor, 3 minus in ankle dorsiflexor and plantar flexor  Sensation is intact to light touch  There is evidence of tactile  extinction on double simultaneous stimulation  There is evidence of left-sided neglect on visual confrontation testing  There is a right gaze preference  Verbal output is sparse but no evidence of dysarthria  Patient does have decreased sensation to light touch in the left. Midline chest incision well healed    Assessment/Plan: 1. Functional deficits secondary to R MCA distribution infarct which require 3+ hours per day of interdisciplinary therapy in a comprehensive inpatient rehab setting. Physiatrist is providing close team supervision and 24 hour management of active medical problems listed below. Physiatrist and rehab team continue to assess barriers to discharge/monitor patient progress toward functional and medical goals. FIM: FIM -  Bathing Bathing Steps Patient Completed: Chest;Left Arm;Abdomen;Right upper leg;Left upper leg Bathing: 3: Mod-Patient completes 5-7 66f 10 parts or 50-74%  FIM - Upper Body Dressing/Undressing Upper body dressing/undressing steps patient completed: Thread/unthread right sleeve of pullover shirt/dresss Upper body dressing/undressing: 2: Max-Patient completed 25-49% of tasks FIM - Lower Body Dressing/Undressing Lower body dressing/undressing steps patient completed: Thread/unthread right pants leg;Thread/unthread left pants leg;Don/Doff right shoe Lower body dressing/undressing: 2: Max-Patient completed 25-49% of tasks  FIM - Toileting Toileting: 1: Total-Patient completed zero steps, helper did all 3  FIM - Diplomatic Services operational officer Devices: Grab bars Toilet Transfers: 1-Two helpers  FIM - Banker Devices: Arm rests Bed/Chair Transfer: 2: Bed > Chair or W/C: Max A (lift and lower assist);2: Chair or W/C > Bed: Max A (lift and lower assist)  FIM - Locomotion: Wheelchair Distance: 150 Locomotion: Wheelchair: 3: Travels 150 ft or more: maneuvers on rugs and over door sills with moderate assistance  (Pt: 50 - 74%) FIM - Locomotion: Ambulation Ambulation/Gait Assistance: Not tested (comment) Locomotion: Ambulation: 0: Activity did not occur  Comprehension Comprehension Mode: Auditory Comprehension: 4-Understands basic 75 - 89% of the time/requires cueing 10 - 24% of the time  Expression Expression Mode: Verbal Expression: 5-Expresses basic 90% of the time/requires cueing < 10% of the time.  Social Interaction Social Interaction: 4-Interacts appropriately 75 - 89% of the time - Needs redirection for appropriate language or to initiate interaction.  Problem Solving Problem Solving Mode: Not assessed Problem Solving: 3-Solves basic 50 - 74% of the time/requires cueing 25 - 49% of the time  Memory Memory Mode: Not  assessed Memory: 4-Recognizes or recalls 75 - 89% of the time/requires cueing 10 - 24% of the time Medical Problem List and Plan:  1. Recent CVA with left-sided weakness. Will attempt to obtain workup while at [facility]  2. DVT Prophylaxis/Anticoagulation: SCDs.Lovenox BID 3. Mood: Zoloft 50 mg daily. Provide emotional support and positive reinforcement  4. Neuropsych: This patient Is capable of making decisions on his/her own behalf.  5. Pain management. Vicodin as needed for headaches  6. Seizure disorder. Dilantin 100 mg 3 times daily. Check Dilantin level and monitor for signs of seizure  7. Hypothyroidism. Synthroid 125 mcg daily. We'll check old records for thyroid studies  8. BPH history of prostate cancer. Flomax/Casodex. Check PVRs x3  9. Cervical spinal stenosis without evidence of radiculopathy  10. Bilateral knee pain osteoarthritis , continue tramadol for pain 11.  Left calf and thigh pain will check dopplers,Short segment post tibial DVT, no need for anti coag will monitor with serial doppler increase lovenox to 30mg  BID and check Duplex Friday 12.  Hyponatremia, d/c IV .9 NS cont FR, recheck BMET    LOS (Days) 7 A FACE TO FACE EVALUATION WAS PERFORMED  Claudette Laws E  03/01/2013, 6:59 AM

## 2013-03-01 NOTE — Progress Notes (Signed)
0930 Fluids discontinued per MD order.

## 2013-03-01 NOTE — Progress Notes (Signed)
Occupational Therapy Session Note  Patient Details  Name: [name] MRN: [id number] Date of Birth: Jul 12, 1931  Today's Date: 03/01/2013 Time: 1300-1400 Time Calculation (min): 60 min  Skilled Therapeutic Interventions/Progress Updates:    Took pt to the OT gym with focus on neuromuscular re-education for the trunk and LUE.  Pt with decreased forward or lateral weight shifts for reciprical scooting, bilateral scooting, and sit to stand transitions.  Attempted to work on reaching and coordination tasks with the LUE to help promote forward weight shift and trunk flexion.  Pt with moderate kyphotic posture in sitting as well.  Pt needs max instructional cueing to maintain selective attention to his left hand when attempting to pick up medium sized pegs and place in the grid.  Demonstrates frequent drops secondary to looking away from his hand and demonstrating limited FM coordination.  Pt needs max assist for sit to stand and was unable to reach complete upright posture during transfers.  Knees and trunk maintained in flexion.    Therapy Documentation Precautions:  Precautions Precautions: Fall Precaution Comments: L inatention Restrictions Weight Bearing Restrictions: No  Pain: Pain Assessment Pain Assessment: No/denies pain Pain Score: 0-No pain Pain Type: Chronic pain Pain Location: Back Pain Descriptors: Aching Patients Stated Pain Goal: 2 Pain Intervention(s): Medication (See eMAR)   Other Treatments: Treatments Neuromuscular Facilitation: Right;Left;Upper Extremity;Activity to increase timing and sequencing;Activity to increase lateral weight shifting  See FIM for current functional status  Therapy/Group: Individual Therapy  MCGUIRE,JAMES OTR/L 03/01/2013, 3:52 PM

## 2013-03-01 NOTE — Progress Notes (Signed)
Speech Language Pathology Daily Session Note  Patient Details  Name: [name] MRN: [id number] Date of Birth: October 06, 1931  Today's Date: 03/01/2013 Time: 0920-1000 Time Calculation (min): 40 min  Short Term Goals: Week 1: SLP Short Term Goal 1 (Week 1): Patient will attent to left environment during reading and writing task with supervision level verbal cues. SLP Short Term Goal 1 - Progress (Week 1): Progressing toward goal SLP Short Term Goal 2 (Week 1): Patient will attend to and utilize left upper extremity during functional tasks with min assist question cues. SLP Short Term Goal 2 - Progress (Week 1): Progressing toward goal SLP Short Term Goal 3 (Week 1): Patient will utilize external aids to recall new information with min assist question cues. SLP Short Term Goal 3 - Progress (Week 1): Progressing toward goal SLP Short Term Goal 4 (Week 1): Patient will solve moderately complex problems with min assist verbal cues.   SLP Short Term Goal 4 - Progress (Week 1): Progressing toward goal SLP Short Term Goal 5 (Week 1): Patient will sustain attention to functional task for 30 miutes with min assist vebral cues for re-direction. SLP Short Term Goal 5 - Progress (Week 1): Progressing toward goal  Skilled Therapeutic Interventions: Skilled treatment session focused on addressing cognitive goals. SLP facilitated session by providing max assist demonstration and visual cues to utilize mouse and navigate internet; a task that patient reported to do daily at home.  Patient had no awareness of left attention and problem solving deficits as a result SLP facilitated a planned failure task by requesting patient log-in to his email account.  After multiple unsuccessful attempts SLP provided options for problem solving.  Patient reported well it is not that I cannot remember it but continued to attempt task unsuccessfully.      FIM:  Comprehension Comprehension Mode: Auditory Comprehension:  5-Follows basic conversation/direction: With extra time/assistive device Expression Expression Mode: Verbal Expression: 5-Expresses complex 90% of the time/cues < 10% of the time Social Interaction Social Interaction: 4-Interacts appropriately 75 - 89% of the time - Needs redirection for appropriate language or to initiate interaction. Problem Solving Problem Solving: 3-Solves basic 50 - 74% of the time/requires cueing 25 - 49% of the time Memory Memory: 3-Recognizes or recalls 50 - 74% of the time/requires cueing 25 - 49% of the time FIM - Eating Eating Activity: 6: More than reasonable amount of time  Pain Pain Assessment Pain Assessment: No/denies pain Pain Score: 0-No pain  Therapy/Group: Individual Therapy  Charlane Ferretti., CCC-SLP [phone]  [name],[name] 03/01/2013, 4:22 PM

## 2013-03-01 NOTE — Progress Notes (Signed)
Physical Therapy Session Note  Patient Details  Name: [name] MRN: [id number] Date of Birth: [birth year]  Today's Date: 03/01/2013 Time: 1130-1200 Time Calculation (min): 30 min  Skilled Therapeutic Interventions/Progress Updates:    Patient received sitting in wheelchair. Today's session focused on wheelchair mobility and increasing functional activity tolerance. Patient exercised on NuStep Level 3 with B LE and B UE x10' to facilitate strengthening, coordination, and cardiovascular endurance.  Patient returned to room and left seated in wheelchair with seatbelt donned and all needs within reach.  Therapy Documentation Precautions:  Precautions Precautions: Fall Precaution Comments: L inatention Restrictions Weight Bearing Restrictions: No Pain: Pain Assessment Pain Assessment: No/denies pain Pain Score: 0-No pain Pain Type: Chronic pain Pain Location: Back Pain Descriptors: Aching Locomotion : Ambulation Ambulation/Gait Assistance: Not tested (comment) Naval architect Mobility: Yes Wheelchair Assistance: 5: Supervision Wheelchair Assistance Details: Verbal cues for Engineer, drilling: Both lower extermities;Right upper extremity Wheelchair Parts Management: Needs assistance Distance: 150 (requires increased time to complete)   See FIM for current functional status  Therapy/Group: Individual Therapy  Chipper Herb. Ripa, PT, DPT  03/01/2013, 12:34 PM

## 2013-03-01 NOTE — Progress Notes (Signed)
Physical Therapy Weekly Progress Note  Patient Details  Name: [name] MRN: [id number] Date of Birth: 06-15-1931  Today's Date: 03/01/2013  Patient did not have STG for week one set by evaluating therapist.  Patient has made slow progress towards PT LTG and is currently max A overall for bed mobility, bed <> w/c transfers with squat pivot, min A w/c mobility secondary to decreased attention to L environment and obstacles, min-mod A for static and dynamic standing balance activities but has not been able to initiate gait training.   Patient continues to demonstrate the following deficits: impaired activity tolerance, decreased attention to L and task, L sided hemi plegia, impaired motor control, timing, sequencing, apraxia, impaired trunk and postural control, dynamic balance with tendency to push posterior in sitting and standing, impaired gait and therefore will continue to benefit from skilled PT intervention to enhance overall performance with activity tolerance, balance, postural control, ability to compensate for deficits, functional use of  left upper extremity and left lower extremity, attention, awareness and coordination.  Patient progressing toward long term goals..  Continue plan of care.  PT Short Term Goals Week 1:  PT Short Term Goal 1 (Week 1): No goals set for week 1 Week 2:  PT Short Term Goal 1 (Week 2): Patient will perform bed mobility to L and R with mod A on flat bed PT Short Term Goal 2 (Week 2): Patient will perform bed <> w/c transfers stand or squat pivot to L and R mod A consistently PT Short Term Goal 3 (Week 2): Patient will performed w/c mobility in controlled environment x 150' wtih supervision and intermittent verbal cues to attend to L environment PT Short Term Goal 4 (Week 2): Patient will perform gait in controlled environment x 15' with LRAD and max A   [name] Faucette 03/01/2013, 4:23 PM

## 2013-03-02 ENCOUNTER — Inpatient Hospital Stay (HOSPITAL_COMMUNITY): Payer: Medicare Other | Admitting: *Deleted

## 2013-03-02 ENCOUNTER — Inpatient Hospital Stay (HOSPITAL_COMMUNITY): Payer: Medicare Other | Admitting: Occupational Therapy

## 2013-03-02 ENCOUNTER — Inpatient Hospital Stay (HOSPITAL_COMMUNITY): Payer: Medicare Other | Admitting: Speech Pathology

## 2013-03-02 NOTE — Progress Notes (Signed)
Patient ID: [name], male   DOB: Nov 16, 1931, 79 y.o.   MRN: [id number]  Subjective/Complaints: 79 year-old male with history of CAD, CABG who was recently evaluated in the emergency room 2 weeks ago at [facility] with weakness of his left arm and left leg noted to have acute infarct. By report he was subsequently transferred to [facility] after evaluation Plavix was added to aspirin. He did not receive TPA per report. Patient noted to have seizure and was loaded with intravenous Dilantin. Paperwork was very minimal due to workup being done at [facility]. He was later transferred back to [facility] for ongoing physical therapy and left-sided weakness secondary to stroke.  The patient has knee pain and takes tramadol at home for this.  The patient has a history of neck pain but not currently complaining. MRI in 2008 demonstrating multilevel cervical stenosis C3-C7 levels  Poor appetite food tastes funny  Left calf and thigh pain, no changes Also with R sided back pain when R leg is extended No CP or SOB Review of Systems  Neurological: Positive for focal weakness. Negative for seizures.    Objective: Vital Signs: Blood pressure 154/74, pulse 70, temperature 97.6 F (36.4 C), temperature source Oral, resp. rate 18, height 5\' 7"  (1.702 m), weight 79.2 kg (174 lb 9.7 oz), SpO2 96.00%. No results found. Results for orders placed during the hospital encounter of 02/22/13 (from the past 72 hour(s))  BASIC METABOLIC PANEL     Status: Abnormal   Collection Time    03/01/13  7:21 AM      Result Value Range   Sodium 130 (*) 135 - 145 mEq/L   Potassium 3.8  3.5 - 5.1 mEq/L   Chloride 96  96 - 112 mEq/L   CO2 25  19 - 32 mEq/L   Glucose, Bld 88  70 - 99 mg/dL   BUN 5 (*) 6 - 23 mg/dL   Creatinine, Ser 4.09  0.50 - 1.35 mg/dL   Calcium 9.0  8.4 - 81.1 mg/dL   GFR calc non Af Amer >90  >90 mL/min   GFR calc Af Amer >90  >90 mL/min   Comment:            The  eGFR has been calculated     using the CKD EPI equation.     This calculation has not been     validated in all clinical     situations.     eGFR's persistently     <90 mL/min signify     possible Chronic Kidney Disease.      Vitals reviewed.  Constitutional: He is oriented to person, place, and time. He appears well-developed.  Mood/affect flat HENT:  Head: Normocephalic.  Eyes: EOM are normal.  Neck: Neck supple. No thyromegaly present.  Cardiovascular: Normal rate and regular rhythm.  Pulmonary/Chest: Effort normal and breath sounds normal. No respiratory distress.  Abdominal: Soft. Bowel sounds are normal. He exhibits no distension.  Musculoskeletal: He exhibits no edema. No calf or thigh swelling or tenderness, neg Homan's Neurological: He is alert and oriented to person, place, and time.  Follows three step commands  Motor strength is 5/5 in the right deltoid, biceps, triceps, grip, hip flexor, knee extensors, ankle dorsiflexor and plantar flexor  Motor strength is 3 minus/5 in the left deltoid, biceps, triceps, grip, hip flexor, 4 minus in the knee extensor, 3 minus in ankle dorsiflexor and plantar flexor  Sensation is intact to light touch  There is evidence of tactile extinction on double simultaneous stimulation  There is evidence of left-sided neglect on visual confrontation testing  There is a right gaze preference  Verbal output is sparse but no evidence of dysarthria  Patient does have decreased sensation to light touch in the left. Midline chest incision well healed    Assessment/Plan: 1. Functional deficits secondary to R MCA distribution infarct which require 3+ hours per day of interdisciplinary therapy in a comprehensive inpatient rehab setting. Physiatrist is providing close team supervision and 24 hour management of active medical problems listed below. Physiatrist and rehab team continue to assess barriers to discharge/monitor patient progress toward  functional and medical goals. FIM: FIM - Bathing Bathing Steps Patient Completed: Chest;Right Arm;Left Arm;Abdomen;Right upper leg;Left upper leg Bathing: 3: Mod-Patient completes 5-7 50f 10 parts or 50-74%  FIM - Upper Body Dressing/Undressing Upper body dressing/undressing steps patient completed: Thread/unthread right sleeve of pullover shirt/dresss;Thread/unthread left sleeve of pullover shirt/dress Upper body dressing/undressing: 2: Max-Patient completed 25-49% of tasks FIM - Lower Body Dressing/Undressing Lower body dressing/undressing steps patient completed: Thread/unthread right pants leg;Thread/unthread left pants leg;Don/Doff right shoe Lower body dressing/undressing: 1: Total-Patient completed less than 25% of tasks  FIM - Toileting Toileting: 1: Total-Patient completed zero steps, helper did all 3  FIM - Diplomatic Services operational officer Devices: Grab bars Toilet Transfers: 1-Two helpers  FIM - Banker Devices: Bed rails;Arm rests;HOB elevated Bed/Chair Transfer: 1: Two helpers  FIM - Locomotion: Wheelchair Distance: 150 Locomotion: Wheelchair: 5: Travels 150 ft or more: maneuvers on rugs and over door sills with supervision, cueing or coaxing (increased time to complete) FIM - Locomotion: Ambulation Ambulation/Gait Assistance: Not tested (comment) Locomotion: Ambulation: 0: Activity did not occur  Comprehension Comprehension Mode: Auditory Comprehension: 4-Understands basic 75 - 89% of the time/requires cueing 10 - 24% of the time  Expression Expression Mode: Verbal Expression: 5-Expresses complex 90% of the time/cues < 10% of the time  Social Interaction Social Interaction: 4-Interacts appropriately 75 - 89% of the time - Needs redirection for appropriate language or to initiate interaction.  Problem Solving Problem Solving Mode: Not assessed Problem Solving: 3-Solves basic 50 - 74% of the time/requires cueing  25 - 49% of the time  Memory Memory Mode: Not assessed Memory: 4-Recognizes or recalls 75 - 89% of the time/requires cueing 10 - 24% of the time Medical Problem List and Plan:  1. Recent CVA with left-sided weakness. Will attempt to obtain workup while at [facility]  2. DVT Prophylaxis/Anticoagulation: SCDs.Lovenox BID 3. Mood: Zoloft 50 mg daily. Provide emotional support and positive reinforcement, will ask neuropsych to assess consider change to another agent, mirtazapine may stim appetite 4. Neuropsych: This patient Is capable of making decisions on his/her own behalf.  5. Pain management. Vicodin as needed for headaches  6. Seizure disorder. Dilantin 100 mg 3 times daily. Check Dilantin level and monitor for signs of seizure  7. Hypothyroidism. Synthroid 125 mcg daily. We'll check old records for thyroid studies  8. BPH history of prostate cancer. Flomax/Casodex. Check PVRs x3  9. Cervical spinal stenosis without evidence of radiculopathy  10. Bilateral knee pain osteoarthritis , continue tramadol for pain 11.  Left calf and thigh pain will check dopplers,Short segment post tibial DVT, no need for anti coag will monitor with serial doppler increase lovenox to 30mg  BID and check Duplex 3/7 12.  Hyponatremia, d/c IV .9 NS cont FR, recheck BMET    LOS (Days) 8 A FACE  TO FACE EVALUATION WAS PERFORMED  [name] 03/02/2013, 10:46 AM

## 2013-03-02 NOTE — Progress Notes (Signed)
Occupational Therapy Weekly Progress Note and Treatment Session Note  Patient Details  Name: [name] MRN: [id number] Date of Birth: May 02, 1931  Today's Date: 03/02/2013 Time: 4098-1191 Time Calculation (min): 58 min  Patient has met 2 of 5 short term goals.  Pt is making slow progress towards goals and is currently max assist with transfers with squat pivot secondary to pt's decreased ability/fearfulness to weight shift forward, decreased attention to Lt, and attention to task.  Pt is min-mod assist for static standing balance with UE support to complete hygiene and dressing.    Patient continues to demonstrate the following deficits: impaired activity tolerance, decreased attention to Lt and task, Lt sided hemiplegia, impaired motor control, timing, sequencing, apraxia, impaired trunk and postural control, dynamic balance with tendency to push posterior in sitting and standing and therefore will continue to benefit from skilled OT intervention to enhance overall performance with BADL and Reduce care partner burden.  Patient progressing toward long term goals..  Continue plan of care.  OT Short Term Goals Week 1:  OT Short Term Goal 1 (Week 1): Pt. will maintain midline control during grooming with nimial cues for 5 minutes OT Short Term Goal 1 - Progress (Week 1): Met OT Short Term Goal 2 (Week 1): Pt. will utilized LUE during dressing UB with minimal assist OT Short Term Goal 2 - Progress (Week 1): Progressing toward goal OT Short Term Goal 3 (Week 1): Pt. will dress UB with minimal assist OT Short Term Goal 3 - Progress (Week 1): Not met OT Short Term Goal 4 (Week 1): Pt. will dress LB with moderate assist OT Short Term Goal 4 - Progress (Week 1): Not met OT Short Term Goal 5 (Week 1): Pt. will transfer to toilet with max assist OT Short Term Goal 5 - Progress (Week 1): Met Week 2:  OT Short Term Goal 1 (Week 2): Pt. will utilized LUE during dressing UB with minimal assist OT  Short Term Goal 2 (Week 2): Pt. will dress UB with minimal assist OT Short Term Goal 3 (Week 2): Pt. will dress LB with moderate assist OT Short Term Goal 4 (Week 2): Pt will complete toilet transfer with mod assist using grab bar OT Short Term Goal 5 (Week 2): Pt will complete shower transfer with max assist  Skilled Therapeutic Interventions/Progress Updates:    Pt seen for ADL retraining with focus on functional mobility, transfers, attention to Lt side of body, and functional use of LUE with self-care tasks. Max assist squat pivot transfer to Rt to w/c secondary to pt's posterior bias and reluctance to shift weight forward to assist in transfers and sit <> stand.  Pt required increased time and max cues for sequencing with transfers. Engaged in bathing and dressing at sit <> stand level at sink with pt using sink for UE support in standing. Hand over hand assist to increase use of LUE in opening containers and applying toothpaste.  Pt with increased participation with dressing this session, requiring increased cues for orientation and problem solving.    Therapy Documentation Precautions:  Precautions Precautions: Fall Precaution Comments: L inatention Restrictions Weight Bearing Restrictions: No Pain: Pain Assessment Pain Assessment: No/denies pain Pain Score: 0-No pain ADL: ADL Grooming: Minimal assistance Where Assessed-Grooming: Sitting at sink Upper Body Bathing: Minimal assistance Where Assessed-Upper Body Bathing: Sitting at sink Lower Body Bathing: Moderate assistance Where Assessed-Lower Body Bathing: Sitting at sink;Standing at sink Upper Body Dressing: Moderate assistance Where Assessed-Upper Body Dressing: Sitting at sink  Lower Body Dressing: Maximal assistance Where Assessed-Lower Body Dressing: Sitting at sink;Standing at sink Toileting: Maximal assistance Where Assessed-Toileting: Teacher, adult education: Maximal Dentist Method: Lawyer: Raised toilet seat;Grab bars Tub/Shower Transfer: Not assessed  See FIM for current functional status  Therapy/Group: Individual Therapy  [name] 03/02/2013, 12:13 PM

## 2013-03-02 NOTE — Progress Notes (Signed)
Speech Language Pathology Daily Session Note  & Weekly Progress Note  Patient Details  Name: [name] MRN: [id number] Date of Birth: February 23, 1931  Today's Date: 03/02/2013 Time: 0920-1000 Time Calculation (min): 40 min  Short Term Goals: Week 1: SLP Short Term Goal 1 (Week 1): Patient will attent to left environment during reading and writing task with supervision level verbal cues. SLP Short Term Goal 1 - Progress (Week 1): Progressing toward goal SLP Short Term Goal 2 (Week 1): Patient will attend to and utilize left upper extremity during functional tasks with min assist question cues. SLP Short Term Goal 2 - Progress (Week 1): Progressing toward goal SLP Short Term Goal 3 (Week 1): Patient will utilize external aids to recall new information with min assist question cues. SLP Short Term Goal 3 - Progress (Week 1): Met SLP Short Term Goal 4 (Week 1): Patient will solve moderately complex problems with min assist verbal cues.   SLP Short Term Goal 4 - Progress (Week 1): Progressing toward goal SLP Short Term Goal 5 (Week 1): Patient will sustain attention to functional task for 30 miutes with min assist vebral cues for re-direction. SLP Short Term Goal 5 - Progress (Week 1): Progressing toward goal  Skilled Therapeutic Interventions: Skilled treatment session focused on addressing cognitive goals. SLP facilitated by requesting patient locate prices for list of items in grocery ad.  Patient require max faded to mod assist cues to attend to left upper extremity and min assist question cues to scan to left when reading.  SLP also facilitated session with mod assist verbal cues to calculate total for items.     FIM:  Comprehension Comprehension Mode: Auditory Comprehension: 5-Follows basic conversation/direction: With extra time/assistive device Expression Expression Mode: Verbal Expression: 5-Expresses complex 90% of the time/cues < 10% of the time Social Interaction Social  Interaction: 4-Interacts appropriately 75 - 89% of the time - Needs redirection for appropriate language or to initiate interaction. Problem Solving Problem Solving: 3-Solves basic 50 - 74% of the time/requires cueing 25 - 49% of the time Memory Memory: 3-Recognizes or recalls 50 - 74% of the time/requires cueing 25 - 49% of the time  Pain Pain Assessment Pain Assessment: No/denies pain Pain Score: 0-No pain  Therapy/Group: Individual Therapy   Speech Language Pathology Weekly Progress Note  Patient Details  Name: [name] MRN: [id number] Date of Birth: 01-05-1931  Today's Date: 03/02/2013  Short Term Goals: Week 1: SLP Short Term Goal 1 (Week 1): Patient will attent to left environment during reading and writing task with supervision level verbal cues. SLP Short Term Goal 1 - Progress (Week 1): Progressing toward goal SLP Short Term Goal 2 (Week 1): Patient will attend to and utilize left upper extremity during functional tasks with min assist question cues. SLP Short Term Goal 2 - Progress (Week 1): Progressing toward goal SLP Short Term Goal 3 (Week 1): Patient will utilize external aids to recall new information with min assist question cues. SLP Short Term Goal 3 - Progress (Week 1): Met SLP Short Term Goal 4 (Week 1): Patient will solve moderately complex problems with min assist verbal cues.   SLP Short Term Goal 4 - Progress (Week 1): Progressing toward goal SLP Short Term Goal 5 (Week 1): Patient will sustain attention to functional task for 30 miutes with min assist vebral cues for re-direction. SLP Short Term Goal 5 - Progress (Week 1): Progressing toward goal Week 2: SLP Short Term Goal 1 (Week 2): Patient will  attent to left environment during reading and writing task with supervision level verbal cues. SLP Short Term Goal 2 (Week 2): Patient will attend to and utilize left upper extremity during functional tasks with mod assist question cues. SLP Short Term Goal 3  (Week 2): Patient will utilize external aids to recall new information with supervision level question cues. SLP Short Term Goal 4 (Week 2): Patient will solve moderately complex problems with min assist verbal cues.   SLP Short Term Goal 5 (Week 2): Patient will sustain attention to functional task for 15 miutes with min assist vebral cues for re-direction.  Weekly Progress Updates: Patient met 1 out of 5 short term objectives this reporting period due to gains in use of external aids for recall of daily information.  Patient continues to exhibit decreased sustained attention, inattention to left, awareness, problem solving, self monitoring and correcting.  As a result, patient continues to require skilled SLP services to maximize functional independence prior to discharge home to reduce burden of care.    SLP Intensity: Minumum of 1-2 x/day, 30 to 90 minutes SLP Frequency: 5 out of 7 days SLP Treatment/Interventions: Cognitive remediation/compensation;Cueing hierarchy;Environmental controls;Functional tasks;Internal/external aids;Medication managment;Patient/family education;Therapeutic Activities  Charlane Ferretti., CCC-SLP [phone]  [name],[name] 03/02/2013, 4:30 PM

## 2013-03-02 NOTE — Progress Notes (Signed)
Physical Therapy Session Note  Patient Details  Name: [name] MRN: [id number] Date of Birth: 1931/08/13  Today's Date: 03/02/2013 Time: 1300-1400 1500-1533 Time Calculation (min): 60 min and 33 min  Short Term Goals: Week 2:  PT Short Term Goal 1 (Week 2): Patient will perform bed mobility to L and R with mod A on flat bed PT Short Term Goal 2 (Week 2): Patient will perform bed <> w/c transfers stand or squat pivot to L and R mod A consistently PT Short Term Goal 3 (Week 2): Patient will performed w/c mobility in controlled environment x 150' wtih supervision and intermittent verbal cues to attend to L environment PT Short Term Goal 4 (Week 2): Patient will perform gait in controlled environment x 15' with LRAD and max A   Skilled Therapeutic Interventions/Progress Updates:    First session: Patient received sitting in wheelchair. This session focused on wheelchair mobility, transfers, and postural control in static standing. Patient uses B LE to propel wheelchair and requires max cues to attend to task and increased time to complete. Patient performed block practice x8 sit<>stands from elevated mat to [facility] walker. Patient with decreased anxiety with assistive device in front of him. Emphasis on anterior weight shift and B hip extension upon standing. Patient performed dynamic sitting balance task with rings placed on the floor in front of mat. Patient instructed to place rings on stool placed to his L (x 15 rings) to facilitate improved weight shift to the L.  Patient returned to room and left seated in wheelchair with seatbelt donned and all needs within reach.  Second session: Patient received sitting in wheelchair. Patient refusing therapy secondary to back pain. Attempted to encourage patient, but continues to refuse. Patient agreeable to exercise on NuStep. Patient instructed in wheelchair mobility x150' with B LE and supervision, requiring increased time to complete. Patient exercised  on NuStep Lvl 4 with B UE and B LE x12' to facilitate strengthening, coordination, and cardiovascular endurance. Patient returned to room and left seated in wheelchair with seatbelt donned and all needs within reach.  Therapy Documentation Precautions:  Precautions Precautions: Fall Precaution Comments: L inatention Restrictions Weight Bearing Restrictions: No Pain: Pain Assessment Pain Assessment: No/denies pain Pain Score: 0-No pain Locomotion : Ambulation Ambulation/Gait Assistance: Not tested (comment) Wheelchair Mobility Distance: 150   See FIM for current functional status  Therapy/Group: Individual Therapy  Chipper Herb. Spurgeon Gancarz, PT, DPT  03/02/2013, 3:45 PM

## 2013-03-03 DIAGNOSIS — I82409 Acute embolism and thrombosis of unspecified deep veins of unspecified lower extremity: Secondary | ICD-10-CM

## 2013-03-03 LAB — BASIC METABOLIC PANEL
BUN: 8 mg/dL (ref 6–23)
Calcium: 9.2 mg/dL (ref 8.4–10.5)
Creatinine, Ser: 0.6 mg/dL (ref 0.50–1.35)
GFR calc Af Amer: >90 mL/min (ref >90)
GFR calc non Af Amer: >90 mL/min (ref >90)

## 2013-03-03 NOTE — Progress Notes (Addendum)
VASCULAR LAB PRELIMINARY  PRELIMINARY  PRELIMINARY  PRELIMINARY  Follow up left lower extremity venous Doppler completed.    Preliminary report:  A partially occlusive DVT remains in a short segment of the left posterior tibial vein.  It does not appear to have extended.  KANADY, CANDACE, RVT 03/03/2013, 11:11 AM

## 2013-03-03 NOTE — Patient Care Conference (Signed)
Inpatient RehabilitationTeam Conference and Plan of Care Update Date: 03/03/2013   Time: 7:45 AM    Patient Name: [name]      Medical Record Number: [id number]  Date of Birth: 03/29/1931 Sex: Male         Room/Bed: 4005/4005-01 Payor Info: Payor: Advertising copywriter MEDICARE  Plan: [employer] MEDICARE COMPLETE  Product Type: *No Product type*     Admitting Diagnosis: rt cva seizures  Admit Date/Time:  02/22/2013  1:06 PM Admission Comments: No comment available   Primary Diagnosis:  CVA (cerebral infarction) Principal Problem: CVA (cerebral infarction)  Patient Active Problem List   Diagnosis Date Noted  . Prostate cancer   . CVA (cerebral infarction) 02/22/2013  . Spinal stenosis in cervical region 02/22/2013  . Bilateral chronic knee pain 02/22/2013  . Carotid bruit 11/15/2012  . CORONARY ATHEROSCLEROSIS NATIVE CORONARY ARTERY 01/01/2011  . MIXED HYPERLIPIDEMIA 11/26/2010  . ELEVATED BLOOD PRESSURE 11/26/2010    Expected Discharge Date: Expected Discharge Date: 03/22/13  Team Members Present: Physician leading conference: Dr. Claudette Laws Social Worker Present: Dossie Der, LCSW Nurse Present: Laural Roes, RN PT Present: [name], PT;Caroline Adriana Simas, PT;Other (comment) Clarisse Gouge ripa-PT) OT Present: Bretta Bang, Verlene Mayer, OT SLP Present: Fae Pippin, SLP Other (Discipline and Name): Charolette Child Coordinator     Current Status/Progress Goal Weekly Team Focus  Medical   Small left posterior tibial DVT  Monitor for extension of DVT  Increased Lovenox, recheck Doppler duplex on 03/02/2013   Bowel/Bladder   Can be incontient at times bowel/bladder- spills urinal at times.  min assist      Swallow/Nutrition/ Hydration             ADL's   mod assist bathing and UB dressing, total assist LB dressing, +2 squat pivot transfers secondary to pushing  min assist overall, supervision UB dressing and grooming  Lt attention, functional use of LUE, transfers,  activity tolerance   Mobility   +2 to stand, no ambulation yet.  Mod-max A transfers, sitting balance  supervision-min A w/c level; mod A ambulation very short distances  Trunk control, sitting balance, attention to task, transfers   Communication             Safety/Cognition/ Behavioral Observations  mod assist   supervision   increase sustained attention, awareness, self monitoring and correcting   Pain   Ultram 50mg  Q6 hrs scheduled  pain < or = 2      Skin   Bruising to bilateral arms/hands, skin tear to bilateral elbows with allevyn dsg  no new skin breakdown         *See Interdisciplinary Assessment and Plan and progress notes for long and short-term goals  Barriers to Discharge: See above    Possible Resolutions to Barriers:  See above    Discharge Planning/Teaching Needs:  Pt plans to hire 24 hr care for home.  Daughter to over-see this      Team Discussion:    Revisions to Treatment Plan:     Continued Need for Acute Rehabilitation Level of Care: The patient requires daily medical management by a physician with specialized training in physical medicine and rehabilitation for the following conditions: Daily direction of a multidisciplinary physical rehabilitation program to ensure safe treatment while eliciting the highest outcome that is of practical value to the patient.: Yes Daily medical management of patient stability for increased activity during participation in an intensive rehabilitation regime.: Yes Daily analysis of laboratory values and/or radiology reports with any  subsequent need for medication adjustment of medical intervention for : Other;Neurological problems  Erick Colace 03/03/2013, 7:45 AM

## 2013-03-03 NOTE — Progress Notes (Signed)
Patient ID: [name], male   DOB: [birth year], 79 y.o.   MRN: [id number]  Subjective/Complaints: 79 year-old male with history of CAD, CABG who was recently evaluated in the emergency room 2 weeks ago at [facility] with weakness of his left arm and left leg noted to have acute infarct. By report he was subsequently transferred to [facility] after evaluation Plavix was added to aspirin. He did not receive TPA per report. Patient noted to have seizure and was loaded with intravenous Dilantin. Paperwork was very minimal due to workup being done at [facility]. He was later transferred back to [facility] for ongoing physical therapy and left-sided weakness secondary to stroke.  The patient has knee pain and takes tramadol at home for this.  The patient has a history of neck pain but not currently complaining. MRI in 2008 demonstrating multilevel cervical stenosis C3-C7 levels    No CP or SOB, no left leg pain Review of Systems  Neurological: Positive for focal weakness. Negative for seizures.    Objective: Vital Signs: Blood pressure 119/70, pulse 72, temperature 97.7 F (36.5 C), temperature source Oral, resp. rate 18, height 5\' 7"  (1.702 m), weight 79.2 kg (174 lb 9.7 oz), SpO2 97.00%. No results found. Results for orders placed during the hospital encounter of 02/22/13 (from the past 72 hour(s))  BASIC METABOLIC PANEL     Status: Abnormal   Collection Time    03/01/13  7:21 AM      Result Value Range   Sodium 130 (*) 135 - 145 mEq/L   Potassium 3.8  3.5 - 5.1 mEq/L   Chloride 96  96 - 112 mEq/L   CO2 25  19 - 32 mEq/L   Glucose, Bld 88  70 - 99 mg/dL   BUN 5 (*) 6 - 23 mg/dL   Creatinine, Ser 2.53  0.50 - 1.35 mg/dL   Calcium 9.0  8.4 - 66.4 mg/dL   GFR calc non Af Amer >90  >90 mL/min   GFR calc Af Amer >90  >90 mL/min   Comment:            The eGFR has been calculated     using the CKD EPI equation.     This calculation has not been   validated in all clinical     situations.     eGFR's persistently     <90 mL/min signify     possible Chronic Kidney Disease.      Vitals reviewed.  Constitutional: He is oriented to person, place, and time. He appears well-developed.  Mood/affect flat HENT:  Head: Normocephalic.  Eyes: EOM are normal.  Neck: Neck supple. No thyromegaly present.  Cardiovascular: Normal rate and regular rhythm.  Pulmonary/Chest: Effort normal and breath sounds normal. No respiratory distress.  Abdominal: Soft. Bowel sounds are normal. He exhibits no distension.  Musculoskeletal: He exhibits no edema. No calf or thigh swelling or tenderness, neg Homan's Neurological: He is alert and oriented to person, place, and time.  Follows three step commands  Motor strength is 5/5 in the right deltoid, biceps, triceps, grip, hip flexor, knee extensors, ankle dorsiflexor and plantar flexor  Motor strength is 3 minus/5 in the left deltoid, biceps, triceps, grip, hip flexor, 4 minus in the knee extensor, 3 minus in ankle dorsiflexor and plantar flexor  Sensation is intact to light touch  There is evidence of tactile extinction on double simultaneous stimulation  There is evidence of left-sided neglect on visual confrontation  testing  There is a right gaze preference  Verbal output is sparse but no evidence of dysarthria  Patient does have decreased sensation to light touch in the left. Midline chest incision well healed    Assessment/Plan: 1. Functional deficits secondary to R MCA distribution infarct which require 3+ hours per day of interdisciplinary therapy in a comprehensive inpatient rehab setting. Physiatrist is providing close team supervision and 24 hour management of active medical problems listed below. Physiatrist and rehab team continue to assess barriers to discharge/monitor patient progress toward functional and medical goals. FIM: FIM - Bathing Bathing Steps Patient Completed: Chest;Right Arm;Left  Arm;Abdomen;Right upper leg;Left upper leg Bathing: 3: Mod-Patient completes 5-7 40f 10 parts or 50-74%  FIM - Upper Body Dressing/Undressing Upper body dressing/undressing steps patient completed: Thread/unthread right sleeve of pullover shirt/dresss;Put head through opening of pull over shirt/dress Upper body dressing/undressing: 3: Mod-Patient completed 50-74% of tasks FIM - Lower Body Dressing/Undressing Lower body dressing/undressing steps patient completed: Thread/unthread right pants leg;Thread/unthread left pants leg;Don/Doff right shoe Lower body dressing/undressing: 2: Max-Patient completed 25-49% of tasks  FIM - Toileting Toileting: 1: Total-Patient completed zero steps, helper did all 3  FIM - Diplomatic Services operational officer Devices: Grab bars Toilet Transfers: 1-Two helpers  FIM - Banker Devices: Arm rests Bed/Chair Transfer: 2: Bed > Chair or W/C: Max A (lift and lower assist);2: Chair or W/C > Bed: Max A (lift and lower assist)  FIM - Locomotion: Wheelchair Distance: 150 Locomotion: Wheelchair: 5: Travels 150 ft or more: maneuvers on rugs and over door sills with supervision, cueing or coaxing FIM - Locomotion: Ambulation Ambulation/Gait Assistance: Not tested (comment) Locomotion: Ambulation: 0: Activity did not occur  Comprehension Comprehension Mode: Auditory Comprehension: 5-Follows basic conversation/direction: With no assist  Expression Expression Mode: Verbal Expression: 5-Expresses basic needs/ideas: With extra time/assistive device  Social Interaction Social Interaction: 4-Interacts appropriately 75 - 89% of the time - Needs redirection for appropriate language or to initiate interaction.  Problem Solving Problem Solving Mode: Not assessed Problem Solving: 3-Solves basic 50 - 74% of the time/requires cueing 25 - 49% of the time  Memory Memory Mode: Not assessed Memory: 3-Recognizes or recalls 50 -  74% of the time/requires cueing 25 - 49% of the time Medical Problem List and Plan:  1. Recent CVA with left-sided weakness. Will attempt to obtain workup while at [facility]  2. DVT Prophylaxis/Anticoagulation: SCDs.Lovenox BID 3. Mood: Zoloft 50 mg daily. Provide emotional support and positive reinforcement, will ask neuropsych to assess,change to mirtazapine may stim appetite 4. Neuropsych: This patient Is capable of making decisions on his/her own behalf.  5. Pain management. Vicodin as needed for headaches  6. Seizure disorder. Dilantin 100 mg 3 times daily. Check Dilantin level and monitor for signs of seizure  7. Hypothyroidism. Synthroid 125 mcg daily. We'll check old records for thyroid studies  8. BPH history of prostate cancer. Flomax/Casodex. Check PVRs x3  9. Cervical spinal stenosis without evidence of radiculopathy  10. Bilateral knee pain osteoarthritis , continue tramadol for pain 11.  Left calf and thigh pain will check dopplers,Short segment post tibial DVT, no need for anti coag will monitor with serial doppler increase lovenox to 30mg  BID and check Duplex 3/7- not done, called vascular lab await return call 12.  Hyponatremia, d/c IV .9 NS cont FR, recheck BMET    LOS (Days) 9 A FACE TO FACE EVALUATION WAS PERFORMED  KIRSTEINS,ANDREW E 03/03/2013, 7:36 AM

## 2013-03-04 ENCOUNTER — Inpatient Hospital Stay (HOSPITAL_COMMUNITY): Payer: Medicare Other

## 2013-03-04 NOTE — Progress Notes (Signed)
Patient ID: [name], male   DOB: July 20, 1931, 79 y.o.   MRN: [id number]  Subjective/Complaints: 79 year-old male with history of CAD, CABG who was recently evaluated in the emergency room 2 weeks ago at [facility] with weakness of his left arm and left leg noted to have acute infarct. By report he was subsequently transferred to [facility] after evaluation Plavix was added to aspirin. He did not receive TPA per report. Patient noted to have seizure and was loaded with intravenous Dilantin. Paperwork was very minimal due to workup being done at [facility]. He was later transferred back to [facility] for ongoing physical therapy and left-sided weakness secondary to stroke.  The patient has knee pain and takes tramadol at home for this.  The patient has a history of neck pain but not currently complaining. MRI in 2008 demonstrating multilevel cervical stenosis C3-C7 levels  Repeat doppler no progression  No CP or SOB, no left leg pain Review of Systems  HENT: Positive for neck pain.   Musculoskeletal: Negative for joint pain.       Chronic neck pain  Neurological: Positive for focal weakness. Negative for seizures.    Objective: Vital Signs: Blood pressure 123/72, pulse 70, temperature 98.6 F (37 C), temperature source Oral, resp. rate 20, height 5\' 7"  (1.702 m), weight 79.2 kg (174 lb 9.7 oz), SpO2 99.00%. No results found. Results for orders placed during the hospital encounter of 02/22/13 (from the past 72 hour(s))  BASIC METABOLIC PANEL     Status: Abnormal   Collection Time    03/03/13  9:00 AM      Result Value Range   Sodium 129 (*) 135 - 145 mEq/L   Potassium 3.8  3.5 - 5.1 mEq/L   Chloride 94 (*) 96 - 112 mEq/L   CO2 26  19 - 32 mEq/L   Glucose, Bld 99  70 - 99 mg/dL   BUN 8  6 - 23 mg/dL   Creatinine, Ser 4.09  0.50 - 1.35 mg/dL   Calcium 9.2  8.4 - 81.1 mg/dL   GFR calc non Af Amer >90  >90 mL/min   GFR calc Af Amer >90  >90 mL/min   Comment:            The eGFR has been calculated     using the CKD EPI equation.     This calculation has not been     validated in all clinical     situations.     eGFR's persistently     <90 mL/min signify     possible Chronic Kidney Disease.      Vitals reviewed.  Constitutional: He is oriented to person, place, and time. He appears well-developed.  Mood/affect flat HENT:  Head: Normocephalic.  Eyes: EOM are normal.  Neck: Neck supple. No thyromegaly present.  Cardiovascular: Normal rate and regular rhythm.  Pulmonary/Chest: Effort normal and breath sounds normal. No respiratory distress.  Abdominal: Soft. Bowel sounds are normal. He exhibits no distension.  Musculoskeletal: He exhibits no edema. No calf or thigh swelling or tenderness, neg Homan's Neurological: He is alert and oriented to person, place, and time.  Follows three step commands  Motor strength is 5/5 in the right deltoid, biceps, triceps, grip, hip flexor, knee extensors, ankle dorsiflexor and plantar flexor  Motor strength is 3 minus/5 in the left deltoid, biceps, triceps, grip, hip flexor, 4 minus in the knee extensor, 3 minus in ankle dorsiflexor and plantar flexor  Sensation is intact to light touch  There is evidence of tactile extinction on double simultaneous stimulation  There is evidence of left-sided neglect on visual confrontation testing  There is a right gaze preference  Verbal output is sparse but no evidence of dysarthria  Patient does have decreased sensation to light touch in the left. Midline chest incision well healed    Assessment/Plan: 1. Functional deficits secondary to R MCA distribution infarct which require 3+ hours per day of interdisciplinary therapy in a comprehensive inpatient rehab setting. Physiatrist is providing close team supervision and 24 hour management of active medical problems listed below. Physiatrist and rehab team continue to assess barriers to discharge/monitor  patient progress toward functional and medical goals. FIM: FIM - Bathing Bathing Steps Patient Completed: Chest;Right Arm;Left Arm;Abdomen;Right upper leg;Left upper leg Bathing: 3: Mod-Patient completes 5-7 59f 10 parts or 50-74%  FIM - Upper Body Dressing/Undressing Upper body dressing/undressing steps patient completed: Thread/unthread right sleeve of pullover shirt/dresss;Put head through opening of pull over shirt/dress Upper body dressing/undressing: 3: Mod-Patient completed 50-74% of tasks FIM - Lower Body Dressing/Undressing Lower body dressing/undressing steps patient completed: Thread/unthread right pants leg;Thread/unthread left pants leg;Don/Doff right shoe Lower body dressing/undressing: 2: Max-Patient completed 25-49% of tasks  FIM - Toileting Toileting: 1: Total-Patient completed zero steps, helper did all 3  FIM - Diplomatic Services operational officer Devices: Grab bars Toilet Transfers: 1-Two helpers  FIM - Banker Devices: Arm rests Bed/Chair Transfer: 2: Bed > Chair or W/C: Max A (lift and lower assist);2: Chair or W/C > Bed: Max A (lift and lower assist)  FIM - Locomotion: Wheelchair Distance: 150 Locomotion: Wheelchair: 5: Travels 150 ft or more: maneuvers on rugs and over door sills with supervision, cueing or coaxing FIM - Locomotion: Ambulation Ambulation/Gait Assistance: Not tested (comment) Locomotion: Ambulation: 0: Activity did not occur  Comprehension Comprehension Mode: Auditory Comprehension: 5-Understands basic 90% of the time/requires cueing < 10% of the time  Expression Expression Mode: Verbal Expression: 5-Expresses basic needs/ideas: With no assist  Social Interaction Social Interaction: 6-Interacts appropriately with others with medication or extra time (anti-anxiety, antidepressant).  Problem Solving Problem Solving Mode: Not assessed Problem Solving: 5-Solves basic 90% of the time/requires  cueing < 10% of the time  Memory Memory Mode: Not assessed Memory: 4-Recognizes or recalls 75 - 89% of the time/requires cueing 10 - 24% of the time Medical Problem List and Plan:  1. Recent CVA with left-sided weakness. Will attempt to obtain workup while at [facility]  2. DVT Prophylaxis/Anticoagulation: SCDs.Lovenox BID 3. Mood: Zoloft 50 mg daily. Provide emotional support and positive reinforcement, will ask neuropsych to assess,change to mirtazapine may stim appetite 4. Neuropsych: This patient Is capable of making decisions on his/her own behalf.  5. Pain management. Vicodin as needed for headaches  6. Seizure disorder. Dilantin 100 mg 3 times daily. Check Dilantin level and monitor for signs of seizure  7. Hypothyroidism. Synthroid 125 mcg daily. We'll check old records for thyroid studies  8. BPH history of prostate cancer. Flomax/Casodex. Check PVRs x3  9. Cervical spinal stenosis without evidence of radiculopathy  10. Bilateral knee pain osteoarthritis , continue tramadol for pain 11.  Left calf and thigh pain will check dopplers,Short segment post tibial DVT, no need for anti coag will monitor with serial doppler increase lovenox to 30mg  BID and recheck Duplex 3/8 no progression  12.  Hyponatremia, d/c IV .9 NS cont FR, recheck BMET stable 129.  Monitor  LOS (Days) 10 A FACE TO FACE EVALUATION WAS PERFORMED  KIRSTEINS,ANDREW E 03/04/2013, 7:46 AM

## 2013-03-05 ENCOUNTER — Inpatient Hospital Stay (HOSPITAL_COMMUNITY): Payer: Medicare Other

## 2013-03-05 ENCOUNTER — Inpatient Hospital Stay (HOSPITAL_COMMUNITY): Payer: Medicare Other | Admitting: Speech Pathology

## 2013-03-05 ENCOUNTER — Encounter (HOSPITAL_COMMUNITY): Payer: Medicare Other

## 2013-03-05 ENCOUNTER — Inpatient Hospital Stay (HOSPITAL_COMMUNITY): Payer: Medicare Other | Admitting: Physical Therapy

## 2013-03-05 DIAGNOSIS — G8110 Spastic hemiplegia affecting unspecified side: Secondary | ICD-10-CM

## 2013-03-05 DIAGNOSIS — I6330 Cerebral infarction due to thrombosis of unspecified cerebral artery: Secondary | ICD-10-CM

## 2013-03-05 DIAGNOSIS — I6350 Cerebral infarction due to unspecified occlusion or stenosis of unspecified cerebral artery: Secondary | ICD-10-CM

## 2013-03-05 LAB — URINALYSIS, ROUTINE W REFLEX MICROSCOPIC
Bilirubin Urine: NEGATIVE
Ketones, ur: NEGATIVE mg/dL
Nitrite: POSITIVE — AB
Urobilinogen, UA: 0.2 mg/dL (ref 0.0–1.0)

## 2013-03-05 LAB — URINE MICROSCOPIC-ADD ON

## 2013-03-05 MED ORDER — CIPROFLOXACIN HCL 250 MG PO TABS
250.0000 mg | ORAL_TABLET | Freq: Two times a day (BID) | ORAL | Status: DC
  Administered 2013-03-05 – 2013-03-08 (×7): 250 mg via ORAL
  Filled 2013-03-05 (×9): qty 1

## 2013-03-05 MED ORDER — SERTRALINE HCL 50 MG PO TABS
50.0000 mg | ORAL_TABLET | Freq: Every day | ORAL | Status: DC
  Administered 2013-03-06 – 2013-03-07 (×2): 50 mg via ORAL
  Filled 2013-03-05 (×4): qty 1

## 2013-03-05 MED ORDER — SERTRALINE HCL 50 MG PO TABS
75.0000 mg | ORAL_TABLET | Freq: Every day | ORAL | Status: DC
  Filled 2013-03-05 (×2): qty 1

## 2013-03-05 NOTE — Progress Notes (Signed)
Physical Therapy Note  Patient Details  Name: Hogan Hoobler MRN: [id number] Date of Birth: Nov 27, 1931 Today's Date: 03/05/2013  1540-1620 (40 minutes) individual Pain: no complaint of pain initially; pt reported 4/10 pain low back in standing; nurse notified/ meds given Focus of treatment: sit to stand for LT LE quad strengthening; standing tolerance; transfer training; bed mobility training; gait training Treatment: Pt in bed upon arrival; supine to side to sit mod assist;pt able to scoot to edge of bed with min to close SBA; transfer- squat to partial stand mod/max assist with max vcs for technique/hand placement; sit to stand using STEDY X 5 min/mod assist. Pt able to tolerated standing less than 1 minute secondary to c/o fatigue and / or back pain; gait 10 feet X 2 EVA walker mod assist with forward flexion posture.    HOUT,JIM 03/05/2013, 4:20 PM

## 2013-03-05 NOTE — Progress Notes (Signed)
Occupational Therapy Note  Patient Details  Name: [name] MRN: [id number] Date of Birth: [birth year] Today's Date: 03/05/2013  Time: 8:36-9:44am (68 min.) Pt seen for 1:1 OT session focusing on BADL's, transfers, sit <> stand. No pain reported by pt. Pt in bed upon arrival, trying to use urinal. Pt agreed to wash up and dress at sink. During session, pt requested to use urinal 4-5x and not able to go, this was reported to nursing. Dtr also called during session and spoke with therapist, with some concerns that her father seemed more confused today. This was also reported to nursing. Pt used bed rails to sit at EOB, with verbal cues to scoot forward. Pt stated he stood better wearing his sneakers. Once sneakers donned, pt transferred to wheelchair with max A for squat-pivot. Pt bathed UB with min A and verbal cues for thoroughness, max A for LB. Pt stood at sink to wash buttocks with max A. Pt completed 4 sit <> stands, with mod -max A and verbal cues to straighten legs and shift weight forward. UB dressing with min A, LB, max A. Pt states he does have a LH shoehorn at home he can use. Pt may also benefit from elastic shoelaces. After dressing, pt completed oral hygiene at sink. Pt requested to use urinal again at end of session. Set pt up with urinal and call nurse tech to assist him once finished. Call bell in place and nurse tech in room.   Cristen Hessie Diener 03/05/2013, 11:35 AM

## 2013-03-05 NOTE — Progress Notes (Signed)
Patient's last bowel movement 03/02/13.  Positive bowel sounds, abdomen nontender.  Patient denies constipation and is refusing a laxative at this time.  Will continue to monitor.

## 2013-03-05 NOTE — Progress Notes (Signed)
Physical Therapy Session Note  Patient Details  Name: [name] MRN: [id number] Date of Birth: Dec 13, 1931  Today's Date: 03/05/2013 Time: 4098-1191 Time Calculation (min): 56 min  Short Term Goals: Week 1:  PT Short Term Goal 1 (Week 1): No goals set for week 1 Week 2:  PT Short Term Goal 1 (Week 2): Patient will perform bed mobility to L and R with mod A on flat bed PT Short Term Goal 2 (Week 2): Patient will perform bed <> w/c transfers stand or squat pivot to L and R mod A consistently PT Short Term Goal 3 (Week 2): Patient will performed w/c mobility in controlled environment x 150' wtih supervision and intermittent verbal cues to attend to L environment PT Short Term Goal 4 (Week 2): Patient will perform gait in controlled environment x 15' with LRAD and max A   Skilled Therapeutic Interventions/Progress Updates:   Patient finishing with speech therapy.  Performed w/c mobility on unit x 150' with bilat foot propulsion with mod A overall for attention and initiation of propulsion with L foot and problem solving to navigate around obstacles and people.  Performed multiple sit <> stands from w/c and prolonged standing x 5-8 minutes at a time x 4-5 reps with standing frame for support secondary to LE fatigue and knee pain with focus on activation of LLE extensors during R lateral weight shifting during reaching forwards, up and to the R with RUE for PVC pipe pieces and to return pieces to box, attention to task, problem solving and sequencing with PVC pipe tree with picture to guide construction and use of LUE to construct and take down tree.  Required multiple rest breaks secondary to knee pain and fatigue and required verbal and questioning cues to sequence and problem solve construction of task beginning with less than 10% cues >> 90% cues as patient fatigued or became more focused on pain in knees; when patient was sitting and pain was decreased patient was able to identify errors with 50%  cues and verbalize solutions.  Patient was able to complete sit <> stand from w/c with UE on tall table with mod A.  Performed posterior scooting in w/c with focus on anterior and R lateral lean and maintaining while lifting buttocks to prevent pushing and posterior LOB to L.  Required mod A for scooting.    Therapy Documentation Precautions:  Precautions Precautions: Fall Precaution Comments: L inatention Restrictions Weight Bearing Restrictions: No Pain: Pain Assessment Pain Assessment: 0-10 Pain Score:   5 Pain Type: Chronic pain Pain Location: Knee Pain Orientation: Left Pain Descriptors: Aching Pain Frequency: Intermittent Pain Onset: Progressive (with prolonged standing) Patients Stated Pain Goal: 2 Pain Intervention(s): Repositioned (rested in sitting) Multiple Pain Sites: No Locomotion : Wheelchair Mobility Distance: 150   See FIM for current functional status  Therapy/Group: Individual Therapy  [name] [facility] 03/05/2013, 12:22 PM

## 2013-03-05 NOTE — Progress Notes (Signed)
Patient ID: [name], male   DOB: 1931-09-06, 79 y.o.   MRN: [id number]  Subjective/Complaints: 79 year-old male with history of CAD, CABG who was recently evaluated in the emergency room 2 weeks ago at [facility] with weakness of his left arm and left leg noted to have acute infarct. By report he was subsequently transferred to [facility] after evaluation Plavix was added to aspirin. He did not receive TPA per report. Patient noted to have seizure and was loaded with intravenous Dilantin. Paperwork was very minimal due to workup being done at [facility]. He was later transferred back to [facility] for ongoing physical therapy and left-sided weakness secondary to stroke.  The patient has knee pain and takes tramadol at home for this.  The patient has a history of neck pain but not currently complaining. MRI in 2008 demonstrating multilevel cervical stenosis C3-C7 levels  Repeat doppler no progression  No CP or SOB, no left leg pain "food tastes funny , poor appetite"  50-75% meal consumption Review of Systems  HENT: Positive for neck pain.   Musculoskeletal: Negative for joint pain.       Chronic neck pain  Neurological: Positive for focal weakness. Negative for seizures.    Objective: Vital Signs: Blood pressure 136/70, pulse 74, temperature 98.2 F (36.8 C), temperature source Oral, resp. rate 16, height 5\' 7"  (1.702 m), weight 79.2 kg (174 lb 9.7 oz), SpO2 99.00%. No results found. Results for orders placed during the hospital encounter of 02/22/13 (from the past 72 hour(s))  BASIC METABOLIC PANEL     Status: Abnormal   Collection Time    03/03/13  9:00 AM      Result Value Range   Sodium 129 (*) 135 - 145 mEq/L   Potassium 3.8  3.5 - 5.1 mEq/L   Chloride 94 (*) 96 - 112 mEq/L   CO2 26  19 - 32 mEq/L   Glucose, Bld 99  70 - 99 mg/dL   BUN 8  6 - 23 mg/dL   Creatinine, Ser 2.95  0.50 - 1.35 mg/dL   Calcium 9.2  8.4 - 62.1 mg/dL   GFR calc non  Af Amer >90  >90 mL/min   GFR calc Af Amer >90  >90 mL/min   Comment:            The eGFR has been calculated     using the CKD EPI equation.     This calculation has not been     validated in all clinical     situations.     eGFR's persistently     <90 mL/min signify     possible Chronic Kidney Disease.      Vitals reviewed.  Constitutional: He is oriented to person, place, and time. He appears well-developed.  Mood/affect flat HENT:  Head: Normocephalic.  Eyes: EOM are normal.  Neck: Neck supple. No thyromegaly present.  Cardiovascular: Normal rate and regular rhythm.  Pulmonary/Chest: Effort normal and breath sounds normal. No respiratory distress.  Abdominal: Soft. Bowel sounds are normal. He exhibits no distension.  Musculoskeletal: He exhibits no edema. No calf or thigh swelling or tenderness, neg Homan's Neurological: He is alert and oriented to person, place, and time.  Follows three step commands  Motor strength is 5/5 in the right deltoid, biceps, triceps, grip, hip flexor, knee extensors, ankle dorsiflexor and plantar flexor  Motor strength is 3 minus/5 in the left deltoid, biceps, triceps, grip, hip flexor, 4 minus in the  knee extensor, 3 minus in ankle dorsiflexor and plantar flexor  Sensation is intact to light touch  There is evidence of tactile extinction on double simultaneous stimulation  There is evidence of left-sided neglect on visual confrontation testing  There is a right gaze preference  Verbal output is sparse but no evidence of dysarthria  Patient does have decreased sensation to light touch in the left. Midline chest incision well healed    Assessment/Plan: 1. Functional deficits secondary to R MCA distribution infarct which require 3+ hours per day of interdisciplinary therapy in a comprehensive inpatient rehab setting. Physiatrist is providing close team supervision and 24 hour management of active medical problems listed below. Physiatrist and  rehab team continue to assess barriers to discharge/monitor patient progress toward functional and medical goals. FIM: FIM - Bathing Bathing Steps Patient Completed: Chest;Right Arm;Left Arm;Abdomen;Right upper leg;Left upper leg Bathing: 3: Mod-Patient completes 5-7 86f 10 parts or 50-74%  FIM - Upper Body Dressing/Undressing Upper body dressing/undressing steps patient completed: Thread/unthread right sleeve of pullover shirt/dresss;Put head through opening of pull over shirt/dress Upper body dressing/undressing: 3: Mod-Patient completed 50-74% of tasks FIM - Lower Body Dressing/Undressing Lower body dressing/undressing steps patient completed: Thread/unthread right pants leg;Thread/unthread left pants leg;Don/Doff right shoe Lower body dressing/undressing: 2: Max-Patient completed 25-49% of tasks  FIM - Toileting Toileting: 1: Total-Patient completed zero steps, helper did all 3  FIM - Diplomatic Services operational officer Devices: Grab bars Toilet Transfers: 1-Two helpers  FIM - Banker Devices: Arm rests Bed/Chair Transfer: 2: Bed > Chair or W/C: Max A (lift and lower assist);2: Chair or W/C > Bed: Max A (lift and lower assist)  FIM - Locomotion: Wheelchair Distance: 150 Locomotion: Wheelchair: 5: Travels 150 ft or more: maneuvers on rugs and over door sills with supervision, cueing or coaxing FIM - Locomotion: Ambulation Ambulation/Gait Assistance: Not tested (comment) Locomotion: Ambulation: 0: Activity did not occur  Comprehension Comprehension Mode: Auditory Comprehension: 5-Follows basic conversation/direction: With no assist  Expression Expression Mode: Verbal Expression: 5-Expresses basic needs/ideas: With extra time/assistive device  Social Interaction Social Interaction: 5-Interacts appropriately 90% of the time - Needs monitoring or encouragement for participation or interaction.  Problem Solving Problem Solving  Mode: Not assessed Problem Solving: 5-Solves complex 90% of the time/cues < 10% of the time  Memory Memory Mode: Not assessed Memory: 4-Recognizes or recalls 75 - 89% of the time/requires cueing 10 - 24% of the time Medical Problem List and Plan:  1. Recent CVA with left-sided weakness. Will attempt to obtain workup while at [facility]  2. DVT Prophylaxis/Anticoagulation: SCDs.Lovenox BID 3. Mood: Zoloft 50 mg daily. Provide emotional support and positive reinforcement, will ask neuropsych to assess,change to mirtazapine may stim appetite, takes 50-75% meals on average although 100% 3/9 supper 4. Neuropsych: This patient Is capable of making decisions on his/her own behalf. Will assess for depression 5. Pain management. Vicodin as needed for headaches  6. Seizure disorder. Dilantin 100 mg 3 times daily. Check Dilantin level and monitor for signs of seizure  7. Hypothyroidism. Synthroid 125 mcg daily. We'll check old records for thyroid studies  8. BPH history of prostate cancer. Flomax/Casodex. Check PVRs x3  9. Cervical spinal stenosis without evidence of radiculopathy  10. Bilateral knee pain osteoarthritis , continue tramadol for pain 11.  Left calf and thigh pain will check dopplers,Short segment post tibial DVT, no need for anti coag will monitor with serial doppler increase lovenox to 30mg  BID and recheck Duplex  3/8 no progression  12.  Hyponatremia, d/c IV .9 NS cont FR, recheck BMET stable 129.  Monitor    LOS (Days) 11 A FACE TO FACE EVALUATION WAS PERFORMED  KIRSTEINS,ANDREW E 03/05/2013, 7:03 AM

## 2013-03-05 NOTE — Progress Notes (Signed)
Speech Language Pathology Daily Session Note  Patient Details  Name: [name] MRN: [id number] Date of Birth: January 05, 1931  Today's Date: 03/05/2013 Time: 1030-1100 Time Calculation (min): 30 min  Short Term Goals: Week 2: SLP Short Term Goal 1 (Week 2): Patient will attent to left environment during reading and writing task with supervision level verbal cues. SLP Short Term Goal 2 (Week 2): Patient will attend to and utilize left upper extremity during functional tasks with mod assist question cues. SLP Short Term Goal 3 (Week 2): Patient will utilize external aids to recall new information with supervision level question cues. SLP Short Term Goal 4 (Week 2): Patient will solve moderately complex problems with min assist verbal cues.   SLP Short Term Goal 5 (Week 2): Patient will sustain attention to functional task for 15 miutes with min assist vebral cues for re-direction.  Skilled Therapeutic Interventions: Skilled treatment session focused on addressing cognitive goals. SLP facilitated by requesting patient route find speech room via use of wheelchair.  Patient required max assist to sustain attention to task for ~45 seconds as well as max assist to attend to left during task.  Patient attempted to log-in to email on computer and after numerous attempts stated to SLP that he needs his daughter to bring him his sign in and password in order to check email.  Demonstrating some increased awareness and problem solving compared to last week.    FIM:  Comprehension Comprehension Mode: Auditory Comprehension: 5-Follows basic conversation/direction: With extra time/assistive device Expression Expression Mode: Verbal Expression: 5-Expresses basic needs/ideas: With extra time/assistive device Social Interaction Social Interaction: 4-Interacts appropriately 75 - 89% of the time - Needs redirection for appropriate language or to initiate interaction. Problem Solving Problem Solving: 2-Solves  basic 25 - 49% of the time - needs direction more than half the time to initiate, plan or complete simple activities Memory Memory: 3-Recognizes or recalls 50 - 74% of the time/requires cueing 25 - 49% of the time  Pain Pain Assessment Pain Assessment: No/denies pain  Therapy/Group: Individual Therapy  Charlane Ferretti., CCC-SLP [phone]  [name],[name] 03/05/2013, 4:24 PM

## 2013-03-06 ENCOUNTER — Encounter (HOSPITAL_COMMUNITY): Payer: Medicare Other

## 2013-03-06 ENCOUNTER — Inpatient Hospital Stay (HOSPITAL_COMMUNITY): Payer: Medicare Other | Admitting: Speech Pathology

## 2013-03-06 ENCOUNTER — Inpatient Hospital Stay (HOSPITAL_COMMUNITY): Payer: Medicare Other | Admitting: Physical Therapy

## 2013-03-06 ENCOUNTER — Inpatient Hospital Stay (HOSPITAL_COMMUNITY): Payer: Medicare Other | Admitting: Occupational Therapy

## 2013-03-06 NOTE — Progress Notes (Signed)
Physical Therapy Session Note  Patient Details  Name: [name] MRN: [id number] Date of Birth: Jul 16, 1931  Today's Date: 03/06/2013 Time: 4098-1191 Time Calculation (min): 53 min  Short Term Goals: Week 1:  PT Short Term Goal 1 (Week 1): No goals set for week 1 Week 2:  PT Short Term Goal 1 (Week 2): Patient will perform bed mobility to L and R with mod A on flat bed PT Short Term Goal 2 (Week 2): Patient will perform bed <> w/c transfers stand or squat pivot to L and R mod A consistently PT Short Term Goal 3 (Week 2): Patient will performed w/c mobility in controlled environment x 150' wtih supervision and intermittent verbal cues to attend to L environment PT Short Term Goal 4 (Week 2): Patient will perform gait in controlled environment x 15' with LRAD and max A   Therapy Documentation Precautions:  Precautions Precautions: Fall Precaution Comments: L inatention Restrictions Weight Bearing Restrictions: No Vital Signs: Therapy Vitals Pulse Rate: 100 Pain: Pain Assessment Pain Assessment: No/denies pain Locomotion : Ambulation Ambulation/Gait Assistance: 3: Mod assist x 6' + 18' with bilat UE support on EVA walker and tactile and verbal cues for lateral weight shifting, upright trunk posture, attention to task and full step length LLE; required Max A during pivoting with EVA walker to return to w/c secondary to patient with decreased R weight shift, decreased ability to advance LLE and patient attempting to sit prior to full pivot.  Also performed stair negotiation training up and down 3 stairs with bilat UE support on 2 rails and mod A overall with verbal cues for safety while ascending with alternating sequence and descending with step to sequence and tactile cues for lateral and forward weight shifting.  At bottom of stairs patient continued to require max A to fully pivot prior to sitting with total verbal cues for safety.  Patient requesting to use toilet.  Performed stand  pivots w/c <> toilet with UE support on grab bar with max A and maintain standing with UE support on grab bar during clothing doffing, donning and hygiene.    Wheelchair Mobility Distance: 150 in controlled environment with bilat LE propulsion with mod A with tactile cues for full LLE knee extension and attention to L environment.   See FIM for current functional status  Therapy/Group: Individual Therapy  [name] 03/06/2013, 3:32 PM

## 2013-03-06 NOTE — Progress Notes (Signed)
Speech Language Pathology Daily Session Note  Patient Details  Name: [name] MRN: [id number] Date of Birth: 1931/08/31  Today's Date: 03/06/2013 Time: 0920-1000 Time Calculation (min): 40 min  Short Term Goals: Week 2: SLP Short Term Goal 1 (Week 2): Patient will attent to left environment during reading and writing task with supervision level verbal cues. SLP Short Term Goal 2 (Week 2): Patient will attend to and utilize left upper extremity during functional tasks with mod assist question cues. SLP Short Term Goal 3 (Week 2): Patient will utilize external aids to recall new information with supervision level question cues. SLP Short Term Goal 4 (Week 2): Patient will solve moderately complex problems with min assist verbal cues.   SLP Short Term Goal 5 (Week 2): Patient will sustain attention to functional task for 15 miutes with min assist vebral cues for re-direction.  Skilled Therapeutic Interventions: Skilled treatment session focused on addressing recall and utilization of recall strategies.  SLP requested patient locate and recall 3 news stories on the front page of the newspaper; patient required mod assist verbal cues to scan to the left and locate the third story.  Patient was able to recall 2/3 stories after a 30 minute delay and required mod assist to utilize external aid to assist.  SLP also facilitated session with association strategy task to educate regarding recall strategies patient required mod faded to min assist to utilize during a functional task.  Patient also required mod assist verbal cues to select attention in a mildly distracting environment throughout session.     FIM:  Comprehension Comprehension Mode: Auditory Comprehension: 5-Follows basic conversation/direction: With extra time/assistive device Expression Expression Mode: Verbal Expression: 5-Expresses basic needs/ideas: With extra time/assistive device Social Interaction Social Interaction:  4-Interacts appropriately 75 - 89% of the time - Needs redirection for appropriate language or to initiate interaction. Problem Solving Problem Solving: 2-Solves basic 25 - 49% of the time - needs direction more than half the time to initiate, plan or complete simple activities Memory Memory: 3-Recognizes or recalls 50 - 74% of the time/requires cueing 25 - 49% of the time  Pain Pain Assessment Pain Assessment: No/denies pain  Therapy/Group: Individual Therapy  Charlane Ferretti., CCC-SLP [phone]  [name] 03/06/2013, 1:08 PM

## 2013-03-06 NOTE — Consult Note (Signed)
03/05/13  NEUROCOGNITIVE TESTING - [facility]   Mr. Garl Speigner is a 79 year old, right-handed, Caucasian male, who denied experiencing any cognitive difficulties at this time.  According to medical records, the patient was admitted to the rehab unit owing to functional deficits secondary to a right MCA distribution infarct. He acknowledged suffering from left-sided weakness and partial paralysis, and said that his voice seems softer.   Mr. Reuss was referred for neuropsychological consultation given the possibility of cognitive sequelae subsequent to the current medical situation and to assist in treatment planning.   PROCEDURES: [3 units of 29562 on 03/05/13]  Diagnostic Interview Medical record review Behavioral observations  The following tests were performed during today's visit: Repeatable Battery for the Assessment of Neuropsychological Status (RBANS, form A), and the Geriatric Depression Scale (short form).  Test results are as follows:   Of note, one task was not administered due to certain physical limitations.   RBANS Indices Scaled Score Percentile Description  Immediate Memory  90 25 Average  Visuospatial/Constructional 58 <1 Markedly impaired  Language 85 16 Below average  Attention N/A N/A N/A  Delayed Memory 88 21 Below average  Total Score N/A N/A N/A    RBANS Subtests Raw Score Percentile Description  List Learning 20 9 Borderline impaired  Story Memory 19 66 Average  Figure Copy 14 2 Markedly impaired  Line Orientation 1 <1 Markedly impaired  Picture Naming 10 70 Average  Semantic Fluency 10 3 Impaired  Digit Span 5 1 Markedly impaired  Coding N/A N/A N/A  List Recall 7 79 Average  List Recognition 18 16 Below average  Story Recall 8 32 Average  Figure recall 12 45 Average    Geriatric Depression Scale (short form) Raw Score = 1/15 Description = WNL   Cognitive Evaluation: Test results revealed reduced (if not  impaired) functioning in several cognitive domains and thinking skills assessed during this evaluation with predominant issues noted with visuospatial/constructional skills. Relative strengths were observed in certain aspects of learning and memory along with confrontation naming.   Emotional & Behavioral Evaluation: Mr. Mangione was appropriately dressed for season and situation, and he appeared tidy and well-groomed. Normal posture was noted. He was friendly and rapport was easily established. His voice was hypophonic but he was able to express ideas effectively. He seemed to understand test directions readily. His affect was appropriately modulated. Attention and motivation were good. Optimal test taking conditions were maintained.  From an emotional standpoint, on a brief self-report inventory, Mr. Keenum denied experiencing any significant signs of depression. Suicidal/homicidal ideation, plan or intent was denied. No manic or hypomanic episodes were reported. The patient denied ever experiencing any auditory/visual hallucinations. No major behavioral or personality changes were endorsed.    Overall, test results revealed a number of cognitive and motor deficits consistent with the type and placement of the stroke the patient suffered. Memory was intact. In addition, no significant signs of clinical psychopathology seemed present.   In light of these findings, the following recommendations are provided.    RECOMMENDATIONS  Recommendations for treatment team:    When interacting with Mr. Waltner, directions and information should be provided in a simple, straight forward manner, and the treatment team should avoid giving multiple instructions simultaneously.    He may benefit from being provided with multiple trials to learn new skills, and will benefit even more from recognition cueing and an increase in structure and context (i.e., directions given in conversational format as opposed to a  random  list of instructions).     To the extent possible, multitasking should be avoided.   Mr. Arizola requires more time than typical to process information. The treatment team may benefit from waiting for a verbal response to information before presenting additional information.    Be aware that he is suffering from significant spatial judgment deficits of which he is not aware.  Taking this into consideration will help tailor therapy and keep accidents at bay as much as possible when he is trying to navigate his surroundings.    Performance will generally be best in a structured, routine, and familiar environment, as opposed to situations involving complex problems.    Mr. Kinne appears to be able to make the most appropriate/competent decisions regarding his care.    Recommendations for discharge planning:    Complete a comprehensive neuropsychological evaluation as an outpatient in 8-12 months to assess for interval change.   Establish long-term follow-up care with a [facility] Sellen knowledgeable in stroke.    Establish long-term follow-up care with psychologist trained in trauma focused therapy.   Maintain engagement in mentally, physically and cognitively stimulating activities.    Strive to maintain a healthy lifestyle (e.g., proper diet and exercise) in order to promote physical, cognitive and emotional health.    Due to the nature and severity of his symptoms, it is recommended that he initially obtain constant care and supervision following this hospitalization.    Establishing a power of attorney is warranted.    The patient should refrain from driving at this time.    REFERRING DIAGNOSIS: Cerebral infarction  FINAL DIAGNOSIS:  Cerebral infarction    Debbe Mounts, Psy.D.  Clinical Neuropsychologist

## 2013-03-06 NOTE — Progress Notes (Signed)
Occupational Therapy Note  Patient Details  Name: [name] MRN: [id number] Date of Birth: 08-23-1931 Today's Date: 03/06/2013  Time: 11am-12:05pm ( .) Pt seen for 1:1 OT session for participation in ADL's with focus on compensatory techniques, bed mobility and transfers. Pt supine in bed upon arrival, ready to bathe and dress. No pain reported by pt. Therapist suggested pt complete bathing and dressing of LB while in bed for energy conservation and also to increase pt participation of task. With [facility] raised, pt was able to participate in LB bathing more and involved LUE as well. Max A to donn TED hose and socks. Pt rolled side to side to pull up pants. Pt would benefit from a reacher, LH sponge when showering and LH shoehorn (which he has at home.) Intermittent verbal and manual cueing for bed mobility and when sitting at EOB. Pt completed squat-pivot transfer into wheelchair with max A, with pt appearing anxious. Pt able to position himself with less assistance today once in wheelchair. Pt completed UB bathing and dressing, washed hair with shampoo shower cap and completed oral hygiene at sink level. Pt actively attempting to use LUE during activities, which therapist encouraged. Left pt with nurse as he had to use urinal.   Cristen Hessie Diener 03/06/2013, 12:17 PM

## 2013-03-06 NOTE — Progress Notes (Addendum)
Patient ID: [name], male   DOB: 17-Nov-1931, 79 y.o.   MRN: [id number]  Subjective/Complaints: 79 year-old male with history of CAD, CABG who was recently evaluated in the emergency room 2 weeks ago at [facility] with weakness of his left arm and left leg noted to have acute infarct. By report he was subsequently transferred to [facility] after evaluation Plavix was added to aspirin. He did not receive TPA per report. Patient noted to have seizure and was loaded with intravenous Dilantin. Paperwork was very minimal due to workup being done at [facility]. He was later transferred back to [facility] for ongoing physical therapy and left-sided weakness secondary to stroke.  The patient has knee pain and takes tramadol at home for this.  The patient has a history of neck pain but not currently complaining. MRI in 2008 demonstrating multilevel cervical stenosis C3-C7 levels  Repeat doppler no progression Met with neuropsych, pt doesn't remember upshot  No CP or SOB, no left leg pain "food tastes funny , poor appetite"  50-75% meal consumption Review of Systems  HENT: Positive for neck pain.   Musculoskeletal: Negative for joint pain.       Chronic neck pain  Neurological: Positive for focal weakness. Negative for seizures.    Objective: Vital Signs: Blood pressure 119/78, pulse 77, temperature 97.7 F (36.5 C), temperature source Oral, resp. rate 20, height 5\' 7"  (1.702 m), weight 79.2 kg (174 lb 9.7 oz), SpO2 98.00%. No results found. Results for orders placed during the hospital encounter of 02/22/13 (from the past 72 hour(s))  BASIC METABOLIC PANEL     Status: Abnormal   Collection Time    03/03/13  9:00 AM      Result Value Range   Sodium 129 (*) 135 - 145 mEq/L   Potassium 3.8  3.5 - 5.1 mEq/L   Chloride 94 (*) 96 - 112 mEq/L   CO2 26  19 - 32 mEq/L   Glucose, Bld 99  70 - 99 mg/dL   BUN 8  6 - 23 mg/dL   Creatinine, Ser 4.09  0.50 - 1.35 mg/dL    Calcium 9.2  8.4 - 81.1 mg/dL   GFR calc non Af Amer >90  >90 mL/min   GFR calc Af Amer >90  >90 mL/min   Comment:            The eGFR has been calculated     using the CKD EPI equation.     This calculation has not been     validated in all clinical     situations.     eGFR's persistently     <90 mL/min signify     possible Chronic Kidney Disease.  URINALYSIS, ROUTINE W REFLEX MICROSCOPIC     Status: Abnormal   Collection Time    03/05/13 10:02 AM      Result Value Range   Color, Urine YELLOW  YELLOW   APPearance HAZY (*) CLEAR   Specific Gravity, Urine 1.014  1.005 - 1.030   pH 7.5  5.0 - 8.0   Glucose, UA NEGATIVE  NEGATIVE mg/dL   Hgb urine dipstick NEGATIVE  NEGATIVE   Bilirubin Urine NEGATIVE  NEGATIVE   Ketones, ur NEGATIVE  NEGATIVE mg/dL   Protein, ur NEGATIVE  NEGATIVE mg/dL   Urobilinogen, UA 0.2  0.0 - 1.0 mg/dL   Nitrite POSITIVE (*) NEGATIVE   Leukocytes, UA TRACE (*) NEGATIVE  URINE MICROSCOPIC-ADD ON     Status:  Abnormal   Collection Time    03/05/13 10:02 AM      Result Value Range   WBC, UA 0-2  <3 WBC/hpf   RBC / HPF 0-2  <3 RBC/hpf   Bacteria, UA FEW (*) RARE   Urine-Other AMORPHOUS URATES/PHOSPHATES        Vitals reviewed.  Constitutional: He is oriented to person, place, and time. He appears well-developed.  Mood/affect flat HENT:  Head: Normocephalic.  Eyes: EOM are normal.  Neck: Neck supple. No thyromegaly present.  Cardiovascular: Normal rate and regular rhythm.  Pulmonary/Chest: Effort normal and breath sounds normal. No respiratory distress.  Abdominal: Soft. Bowel sounds are normal. He exhibits no distension.  Musculoskeletal: He exhibits no edema. No calf or thigh swelling or tenderness, neg Homan's Neurological: He is alert and oriented to person, place, and time.  Follows three step commands  Motor strength is 5/5 in the right deltoid, biceps, triceps, grip, hip flexor, knee extensors, ankle dorsiflexor and plantar flexor  Motor  strength is 3 minus/5 in the left deltoid, biceps, triceps, grip, hip flexor, 4 minus in the knee extensor, 3 minus in ankle dorsiflexor and plantar flexor  Sensation is intact to light touch  There is evidence of tactile extinction on double simultaneous stimulation  There is evidence of left-sided neglect on visual confrontation testing  There is a right gaze preference  Verbal output is sparse but no evidence of dysarthria  Patient does have decreased sensation to light touch in the left. Midline chest incision well healed    Assessment/Plan: 1. Functional deficits secondary to R MCA distribution infarct which require 3+ hours per day of interdisciplinary therapy in a comprehensive inpatient rehab setting. Physiatrist is providing close team supervision and 24 hour management of active medical problems listed below. Physiatrist and rehab team continue to assess barriers to discharge/monitor patient progress toward functional and medical goals. FIM: FIM - Bathing Bathing Steps Patient Completed: Chest;Right Arm;Left Arm;Abdomen;Right upper leg;Left upper leg;Front perineal area;Right lower leg (including foot);Left lower leg (including foot) Bathing: 4: Min-Patient completes 8-9 55f 10 parts or 75+ percent (with increased time and cueing)  FIM - Upper Body Dressing/Undressing Upper body dressing/undressing steps patient completed: Thread/unthread right sleeve of pullover shirt/dresss;Thread/unthread left sleeve of pullover shirt/dress;Put head through opening of pull over shirt/dress Upper body dressing/undressing: 4: Min-Patient completed 75 plus % of tasks FIM - Lower Body Dressing/Undressing Lower body dressing/undressing steps patient completed: Thread/unthread right pants leg Lower body dressing/undressing: 2: Max-Patient completed 25-49% of tasks (pt does have LH shoehorn at home)  FIM - Toileting Toileting: 0: No continent bowel/bladder events this shift (pt tried to use urinal  multiple times during session)  FIM - Diplomatic Services operational officer Devices: Grab bars Toilet Transfers: 3-To toilet/BSC: Mod A (lift or lower assist);3-From toilet/BSC: Mod A (lift or lower assist)  FIM - Bed/Chair Transfer Bed/Chair Transfer Assistive Devices: Bed rails;Arm rests;HOB elevated Bed/Chair Transfer: 4: Supine > Sit: Min A (steadying Pt. > 75%/lift 1 leg);4: Sit > Supine: Min A (steadying pt. > 75%/lift 1 leg);3: Bed > Chair or W/C: Mod A (lift or lower assist);3: Chair or W/C > Bed: Mod A (lift or lower assist)  FIM - Locomotion: Wheelchair Distance: 150 Locomotion: Wheelchair: 3: Travels 150 ft or more: maneuvers on rugs and over door sills with moderate assistance  (Pt: 50 - 74%) FIM - Locomotion: Ambulation Ambulation/Gait Assistance: Not tested (comment) Locomotion: Ambulation: 0: Activity did not occur  Comprehension Comprehension Mode: Auditory Comprehension: 5-Follows  basic conversation/direction: With extra time/assistive device  Expression Expression Mode: Verbal Expression: 5-Expresses basic needs/ideas: With extra time/assistive device  Social Interaction Social Interaction: 4-Interacts appropriately 75 - 89% of the time - Needs redirection for appropriate language or to initiate interaction.  Problem Solving Problem Solving Mode: Not assessed Problem Solving: 3-Solves basic 50 - 74% of the time/requires cueing 25 - 49% of the time  Memory Memory Mode: Not assessed Memory: 4-Recognizes or recalls 75 - 89% of the time/requires cueing 10 - 24% of the time Medical Problem List and Plan:  1. Recent CVA with left-sided weakness. Will attempt to obtain workup while at [facility]  2. DVT Prophylaxis/Anticoagulation: SCDs.Lovenox BID 3. Mood: Zoloft 50 mg daily. Provide emotional support and positive reinforcement, will ask neuropsych to assess,change to mirtazapine may stim appetite, takes 50-75% meals on average although 100% 3/9  supper 4. Neuropsych: This patient Is capable of making decisions on his/her own behalf. Will assess for depression 5. Pain management. Vicodin as needed for headaches  6. Seizure disorder. Dilantin 100 mg 3 times daily. Check Dilantin level and monitor for signs of seizure  7. Hypothyroidism. Synthroid 125 mcg daily. We'll check old records for thyroid studies  8. BPH history of prostate cancer. Flomax/Casodex. Check PVRs x3  9. Cervical spinal stenosis without evidence of radiculopathy  10. Bilateral knee pain osteoarthritis , continue tramadol for pain 11.  Left calf and thigh pain will check dopplers,Short segment post tibial DVT, no need for anti coag will monitor with serial doppler increase lovenox to 30mg  BID and recheck Duplex 3/8 no progression  12.  Hyponatremia, d/c IV .9 NS cont FR, recheck BMET stable 129.  Monitor 13 Probable UTI now on cipro pending cx  LOS (Days) 12 A FACE TO FACE EVALUATION WAS PERFORMED  Rocsi Hazelbaker E 03/06/2013, 7:11 AM

## 2013-03-06 NOTE — Progress Notes (Signed)
Occupational Therapy Session Note  Patient Details  Name: [name] MRN: [id number] Date of Birth: 05/04/1931  Today's Date: 03/06/2013 Time: 1300-1330 Time Calculation (min): 30 min  Short Term Goals: Week 2:  OT Short Term Goal 1 (Week 2): Pt. will utilized LUE during dressing UB with minimal assist OT Short Term Goal 2 (Week 2): Pt. will dress UB with minimal assist OT Short Term Goal 3 (Week 2): Pt. will dress LB with moderate assist OT Short Term Goal 4 (Week 2): Pt will complete toilet transfer with mod assist using grab bar OT Short Term Goal 5 (Week 2): Pt will complete shower transfer with max assist  Skilled Therapeutic Interventions/Progress Updates:    Pt seen for 1:1 OT with focus on sit <> stand, dynamic standing balance, and weight shifting with reaching in standing.  Engaged in standing outside parallel bars with use of bar for support in standing while completing reaching activity with focus on increasing attention to and use of Lt hand with functional tasks.  Pt required tactile cues at hips and verbal cues to weight shift forward with reaching, pt extremely hesitant to shift forward.  [facility] activity with use of pegs with Lt hand, pt unable to place pegs in hole but able (with increased time) to remove pegs from holes with Lt hand.  Therapy Documentation Precautions:  Precautions Precautions: Fall Precaution Comments: L inatention Restrictions Weight Bearing Restrictions: No Pain: Pain Assessment Pain Assessment: No/denies pain  See FIM for current functional status  Therapy/Group: Individual Therapy  [name] 03/06/2013, 3:03 PM

## 2013-03-07 ENCOUNTER — Inpatient Hospital Stay (HOSPITAL_COMMUNITY): Payer: Medicare Other | Admitting: Physical Therapy

## 2013-03-07 ENCOUNTER — Inpatient Hospital Stay (HOSPITAL_COMMUNITY): Payer: Medicare Other | Admitting: Occupational Therapy

## 2013-03-07 ENCOUNTER — Inpatient Hospital Stay (HOSPITAL_COMMUNITY): Payer: Medicare Other | Admitting: Speech Pathology

## 2013-03-07 ENCOUNTER — Inpatient Hospital Stay (HOSPITAL_COMMUNITY): Payer: Medicare Other

## 2013-03-07 LAB — BASIC METABOLIC PANEL
Calcium: 8.9 mg/dL (ref 8.4–10.5)
Creatinine, Ser: 0.66 mg/dL (ref 0.50–1.35)
GFR calc Af Amer: >90 mL/min (ref >90)
GFR calc non Af Amer: 90 mL/min — ABNORMAL LOW (ref >90)
Sodium: 128 mEq/L — ABNORMAL LOW (ref 135–145)

## 2013-03-07 LAB — PHENYTOIN LEVEL, TOTAL: Phenytoin Lvl: 3.2 ug/mL — ABNORMAL LOW (ref 10.0–20.0)

## 2013-03-07 MED ORDER — SERTRALINE HCL 25 MG PO TABS
25.0000 mg | ORAL_TABLET | Freq: Every day | ORAL | Status: DC
  Administered 2013-03-08 – 2013-03-11 (×4): 25 mg via ORAL
  Filled 2013-03-07 (×6): qty 1

## 2013-03-07 NOTE — Progress Notes (Signed)
Occupational Therapy Session Note  Patient Details  Name: Lamone Ferrelli MRN: [id number] Date of Birth: 1931/05/31  Today's Date: 03/07/2013 Time: 1345-1430 Time Calculation (min): 45 min  Short Term Goals: Week 2:  OT Short Term Goal 1 (Week 2): Pt. will utilized LUE during dressing UB with minimal assist OT Short Term Goal 2 (Week 2): Pt. will dress UB with minimal assist OT Short Term Goal 3 (Week 2): Pt. will dress LB with moderate assist OT Short Term Goal 4 (Week 2): Pt will complete toilet transfer with mod assist using grab bar OT Short Term Goal 5 (Week 2): Pt will complete shower transfer with max assist  Skilled Therapeutic Interventions/Progress Updates:    Pt seen for 1:1 OT with focus on sit <> stand, side stepping, use of LUE with reaching outside BOS, and increased attention to Lt side.  Engaged in sit <> stand at parallel bars with pt utilizing bar to assist in coming to stand, min assist sit to stand as well as standing balance.  Sidestepping to Lt with mod assist and physical assist to advance LLE (adduct), however min assist sidestepping to Rt (abduction).  Reaching outside BOS in standing with RUE support at grab bar when reaching LUE to promote shoulder flexion and weight shifting to Lt.  BUE activity in sitting with PNF pattern reaching with therapy ball and min cues to attend to Lt hand to ensure proper hand placement.    Therapy Documentation Precautions:  Precautions Precautions: Fall Precaution Comments: L inatention Restrictions Weight Bearing Restrictions: No Pain: Pain Assessment Pain Assessment: No/denies pain Pain Score: 0-No pain  See FIM for current functional status  Therapy/Group: Individual Therapy  [name] 03/07/2013, 2:51 PM

## 2013-03-07 NOTE — Patient Care Conference (Signed)
Inpatient RehabilitationTeam Conference and Plan of Care Update Date: 03/07/2013   Time: 10:55 AM    Patient Name: [name]      Medical Record Number: [id number]  Date of Birth: 04/01/1931 Sex: Male         Room/Bed: 4005/4005-01 Payor Info: Payor: Advertising copywriter MEDICARE  Plan: [employer] MEDICARE COMPLETE  Product Type: *No Product type*     Admitting Diagnosis: rt cva seizures  Admit Date/Time:  02/22/2013  1:06 PM Admission Comments: No comment available   Primary Diagnosis:  CVA (cerebral infarction) Principal Problem: CVA (cerebral infarction)  Patient Active Problem List   Diagnosis Date Noted  . Prostate cancer   . CVA (cerebral infarction) 02/22/2013  . Spinal stenosis in cervical region 02/22/2013  . Bilateral chronic knee pain 02/22/2013  . Carotid bruit 11/15/2012  . CORONARY ATHEROSCLEROSIS NATIVE CORONARY ARTERY 01/01/2011  . MIXED HYPERLIPIDEMIA 11/26/2010  . ELEVATED BLOOD PRESSURE 11/26/2010    Expected Discharge Date: Expected Discharge Date: 03/22/13  Team Members Present: Physician leading conference: Dr. Claudette Laws Social Worker Present: Dossie Der, LCSW Nurse Present: Other (comment) Selena Batten Cook-RN) PT Present: [name], PT;Caroline Adriana Simas, PT;Other (comment) Clarisse Gouge Ripa-PT) OT Present: [name], Felipa Eth, OT SLP Present: Fae Pippin, SLP Other (Discipline and Name): marie Noel-PPS Coordinator     Current Status/Progress Goal Weekly Team Focus  Medical   Left posterior tibial DVT not progressing,Neuropsych evaluation no signs of depression however the patient is already on Paxil  Prevent further extension of DVT  Recheck duplex on 3/14,Continue twice a day Lovenox   Bowel/Bladder   Pt continent, spills urine at times, pt continent of bowel  Pt will be continent using urinal with min assist  assist pt using urinal   Swallow/Nutrition/ Hydration     NA        ADL's   mod assist bathing and UB dressing, total assist LB  dressing, max assist squat pivot transfer.  Pt continues to push with transfers and is extremely hesitant to weight shifting with any transfers or standing tasks  min assist overall, supervision UB dressing and grooming  Lt attention, functional use of LUE, transfers, activity tolerance   Mobility   supervision w/c mobility, min-mod A basic transfers, static standing, mod A gait short distances and stairs  supervision-min A w/c level; mod A ambulation very short distances  attention, transfers, standing balance, gait and LLE strength   Communication     NA        Safety/Cognition/ Behavioral Observations  mod assist  supervision   continue increasing attention with distractions present, awarenss, recall and self monitoring/correcting   Pain   Pt's pain controlled with scheduled  Ultram   pain < or = 2      Skin   Bruising to BUE, skin tears to bilat elbows with allevyn drsng, bruising to L groin region  no new skin breakdown  continue to monitor      *See Interdisciplinary Assessment and Plan and progress notes for long and short-term goals  Barriers to Discharge: See above    Possible Resolutions to Barriers:  See above    Discharge Planning/Teaching Needs:  Will need for daughter to come in and attend therapies, so can begin family training, since she will be pt's primary caregiver at discharge      Team Discussion:  Very slow has a fear of falling.  Pain controlled with schedule ultram.  DVT-monitoring with dopplers and lovenox.  Need to begin family education with  daughter.  Decreased recall and attention  Revisions to Treatment Plan:  None   Continued Need for Acute Rehabilitation Level of Care: The patient requires daily medical management by a physician with specialized training in physical medicine and rehabilitation for the following conditions: Daily direction of a multidisciplinary physical rehabilitation program to ensure safe treatment while eliciting the highest  outcome that is of practical value to the patient.: Yes Daily medical management of patient stability for increased activity during participation in an intensive rehabilitation regime.: Yes Daily analysis of laboratory values and/or radiology reports with any subsequent need for medication adjustment of medical intervention for : Other;Neurological problems  Lucy Chris 03/09/2013, 8:49 AM

## 2013-03-07 NOTE — Progress Notes (Signed)
Physical Therapy Session Note  Patient Details  Name: [name] MRN: [id number] Date of Birth: 1931/10/03  Today's Date: 03/07/2013 Time: 6213-0865 Time Calculation (min): 51 min; missed 9 minutes secondary to back pain  Short Term Goals: Week 1:  PT Short Term Goal 1 (Week 1): No goals set for week 1 Week 2:  PT Short Term Goal 1 (Week 2): Patient will perform bed mobility to L and R with mod A on flat bed PT Short Term Goal 2 (Week 2): Patient will perform bed <> w/c transfers stand or squat pivot to L and R mod A consistently PT Short Term Goal 3 (Week 2): Patient will performed w/c mobility in controlled environment x 150' wtih supervision and intermittent verbal cues to attend to L environment PT Short Term Goal 4 (Week 2): Patient will perform gait in controlled environment x 15' with LRAD and max A   Therapy Documentation Precautions:  Precautions Precautions: Fall Precaution Comments: L inatention Restrictions Weight Bearing Restrictions: No Pain: Pain Assessment Pain Assessment: 0-10 Pain Score:   5 Pain Type: Chronic pain Pain Location: Back Pain Orientation: Lower Pain Descriptors: Aching Pain Onset: Gradual Pain Intervention(s): RN made aware Mobility:  Performed sit <> stand training from w/c at // bars with questioning cues for placement of LUE and LLE under BOS and sliding UE forwards on // bars to initiate anterior lean for sit > stand with min A.   Locomotion : Ambulation Ambulation/Gait Assistance: 3: Mod assist in // bars x 12 forwards and 12' backwards with verbal and tactile cues for upright trunk posture, manual facilitation for L trunk and pelvis anterior rotation during swing with verbal cues for full LLE foot clearance and step length forwards and backwards, assistance with anterior weight shift onto LLE to allow full R step length  Wheelchair Mobility Distance: 150 with bilat foot propulsion and mod A overall with tactile and verbal cues for  attention to and activation of LLE extension and flexion to maintain straight navigation, visual cues for attention to L environment and verbal encouragement to attend to task > 30 seconds.   Other Treatments: Treatments Neuromuscular Facilitation: Left;Lower Extremity;Upper Extremity;Forced use;Activity to increase coordination;Activity to increase motor control;Activity to increase timing and sequencing;Activity to increase sustained activation;Activity to increase lateral weight shifting;Activity to increase anterior-posterior weight shifting in standing at // bars with use of visual target of bar to perform full hip and knee extension on L and anterior rotation of L pelvis to shift COG forwards over BOS and maintain.  Also performed NMR for R lateral weight shifting and LLE activation, motor control and coordination during 8-10 reps LLE forwards and L lateral step ups to 2" step with mod A overall for upright trunk, R lateral weight shift and verbal cues for full LLE hip and knee flexion for clearance and ABD.  Required frequent sitting rest breaks secondary to fatigue and back pain.  At end of session patient reporting he was ready for his next scheduled pain medication and requested to cease therapy and return to room.    See FIM for current functional status  Therapy/Group: Individual Therapy  [name] [facility] 03/07/2013, 4:49 PM

## 2013-03-07 NOTE — Progress Notes (Signed)
Speech Language Pathology Daily Session Note  Patient Details  Name: [name] MRN: [id number] Date of Birth: 02/25/1931  Today's Date: 03/07/2013 Time: 1430-1500 Time Calculation (min): 30 min  Short Term Goals: Week 2: SLP Short Term Goal 1 (Week 2): Patient will attent to left environment during reading and writing task with supervision level verbal cues. SLP Short Term Goal 2 (Week 2): Patient will attend to and utilize left upper extremity during functional tasks with mod assist question cues. SLP Short Term Goal 3 (Week 2): Patient will utilize external aids to recall new information with supervision level question cues. SLP Short Term Goal 4 (Week 2): Patient will solve moderately complex problems with min assist verbal cues.   SLP Short Term Goal 5 (Week 2): Patient will sustain attention to functional task for 15 miutes with min assist vebral cues for re-direction.  Skilled Therapeutic Interventions: Skilled treatment session focused on addressing cognitive-linguistic goals.  SLP requested patient propel wheelchair to speech room with mod assist verbal cues to attend to left of environment and increased wait time to locate room.  SLP also facilitated session with new learning task and overall min assist cues to utilize external aids for working memory and mod assist question cues to attend to the left of environment.     FIM:  Comprehension Comprehension Mode: Auditory Comprehension: 5-Follows basic conversation/direction: With extra time/assistive device Expression Expression Mode: Verbal Expression: 5-Expresses basic needs/ideas: With extra time/assistive device Social Interaction Social Interaction: 4-Interacts appropriately 75 - 89% of the time - Needs redirection for appropriate language or to initiate interaction. Problem Solving Problem Solving: 3-Solves basic 50 - 74% of the time/requires cueing 25 - 49% of the time Memory Memory: 4-Recognizes or recalls 75 - 89%  of the time/requires cueing 10 - 24% of the time  Pain Pain Assessment Pain Assessment: No/denies pain Pain Score: 0-No pain  Therapy/Group: Individual Therapy  Charlane Ferretti., CCC-SLP [phone]  Jonavin Seder 03/07/2013, 4:47 PM

## 2013-03-07 NOTE — Progress Notes (Signed)
Patient ID: [name], male   DOB: 1931-12-26, 79 y.o.   MRN: [id number]  Subjective/Complaints: 79 year-old male with history of CAD, CABG who was recently evaluated in the emergency room 2 weeks ago at [facility] with weakness of his left arm  (79) and left leg noted to have acute infarct. By report he was subsequently transferred to [facility] after evaluation Plavix was added to aspirin. He did not receive TPA per report. Patient noted to have seizure and was loaded with intravenous Dilantin. Paperwork was very minimal due to workup being done at [facility]. He was later transferred back to [facility] for ongoing physical therapy and left-sided weakness secondary to stroke.  The patient has knee pain and takes tramadol at home for this.  The patient has a history of neck pain but not currently complaining. MRI in 2008 demonstrating multilevel cervical stenosis C3-C7 levels  Repeat doppler no progression Met with neuropsych, pt doesn't remember upshot  No CP or SOB, no left leg pain Reviewed neuropsych note, no signs of depression, has been on Paxil since [facility] stay  Review of Systems  HENT: Positive for neck pain.   Musculoskeletal: Negative for joint pain.       Chronic neck pain  Neurological: Positive for focal weakness. Negative for seizures.    Objective: Vital Signs: Blood pressure 130/85, pulse 66, temperature 97.5 F (36.4 C), temperature source Oral, resp. rate 18, height 5\' 7"  (1.702 m), weight 79.2 kg (174 lb 9.7 oz), SpO2 97.00%. No results found. Results for orders placed during the hospital encounter of 02/22/13 (from the past 72 hour(s))  URINALYSIS, ROUTINE W REFLEX MICROSCOPIC     Status: Abnormal   Collection Time    03/05/13 10:02 AM      Result Value Range   Color, Urine YELLOW  YELLOW   APPearance HAZY (*) CLEAR   Specific Gravity, Urine 1.014  1.005 - 1.030   pH 7.5  5.0 - 8.0   Glucose, UA NEGATIVE  NEGATIVE mg/dL   Hgb urine  dipstick NEGATIVE  NEGATIVE   Bilirubin Urine NEGATIVE  NEGATIVE   Ketones, ur NEGATIVE  NEGATIVE mg/dL   Protein, ur NEGATIVE  NEGATIVE mg/dL   Urobilinogen, UA 0.2  0.0 - 1.0 mg/dL   Nitrite POSITIVE (*) NEGATIVE   Leukocytes, UA TRACE (*) NEGATIVE  URINE CULTURE     Status: None   Collection Time    03/05/13 10:02 AM      Result Value Range   Specimen Description URINE, CATHETERIZED     Special Requests NONE     Culture  Setup Time 03/05/2013 10:38     Colony Count PENDING     Culture Culture reincubated for better growth     Report Status PENDING    URINE MICROSCOPIC-ADD ON     Status: Abnormal   Collection Time    03/05/13 10:02 AM      Result Value Range   WBC, UA 0-2  <3 WBC/hpf   RBC / HPF 0-2  <3 RBC/hpf   Bacteria, UA FEW (*) RARE   Urine-Other AMORPHOUS URATES/PHOSPHATES        Vitals reviewed.  Constitutional: He is oriented to person, place, and time. He appears well-developed.  Mood/affect flat HENT:  Head: Normocephalic.  Eyes: EOM are normal.  Neck: Neck supple. No thyromegaly present.  Cardiovascular: Normal rate and regular rhythm.  Pulmonary/Chest: Effort normal and breath sounds normal. No respiratory distress.  Abdominal: Soft. Bowel sounds are normal.  He exhibits no distension.  Musculoskeletal: He exhibits no edema. No calf or thigh swelling or tenderness, neg Homan's Neurological: He is alert and oriented to person, place, and time.  Follows three step commands  Motor strength is 5/5 in the right deltoid, biceps, triceps, grip, hip flexor, knee extensors, ankle dorsiflexor and plantar flexor  Motor strength is 3 minus/5 in the left deltoid, biceps, triceps, grip, hip flexor, 4 minus in the knee extensor, 3 minus in ankle dorsiflexor and plantar flexor  Sensation is intact to light touch  There is evidence of tactile extinction on double simultaneous stimulation  There is evidence of left-sided neglect on visual confrontation testing  There is a  right gaze preference  Verbal output is sparse but no evidence of dysarthria  Patient does have decreased sensation to light touch in the left. Midline chest incision well healed    Assessment/Plan: 1. Functional deficits secondary to R MCA distribution infarct which require 3+ hours per day of interdisciplinary therapy in a comprehensive inpatient rehab setting. Physiatrist is providing close team supervision and 24 hour management of active medical problems listed below. Physiatrist and rehab team continue to assess barriers to discharge/monitor patient progress toward functional and medical goals. FIM: FIM - Bathing Bathing Steps Patient Completed: Chest;Right Arm;Left Arm;Abdomen;Front perineal area;Right upper leg;Left upper leg Bathing: 3: Mod-Patient completes 5-7 53f 10 parts or 50-74% (completed LB in bed)  FIM - Upper Body Dressing/Undressing Upper body dressing/undressing steps patient completed: Thread/unthread right sleeve of pullover shirt/dresss;Put head through opening of pull over shirt/dress Upper body dressing/undressing: 3: Mod-Patient completed 50-74% of tasks FIM - Lower Body Dressing/Undressing Lower body dressing/undressing steps patient completed: Thread/unthread right pants leg Lower body dressing/undressing: 1: Total-Patient completed less than 25% of tasks (completed LB dressing in bed)  FIM - Toileting Toileting Assistive Devices: Grab bar or rail for support Toileting: 1: Total-Patient completed zero steps, helper did all 3  FIM - Diplomatic Services operational officer Devices: Grab bars Toilet Transfers: 0-Activity did not occur  FIM - Banker Devices: Bed rails;Arm rests;HOB elevated Bed/Chair Transfer: 3: Chair or W/C > Bed: Mod A (lift or lower assist);3: Bed > Chair or W/C: Mod A (lift or lower assist)  FIM - Locomotion: Wheelchair Distance: 150 Locomotion: Wheelchair: 3: Travels 150 ft or more:  maneuvers on rugs and over door sills with moderate assistance  (Pt: 50 - 74%) FIM - Locomotion: Ambulation Locomotion: Ambulation Assistive Devices: Fara Boros Ambulation/Gait Assistance: 3: Mod assist Locomotion: Ambulation: 1: Travels less than 50 ft with moderate assistance (Pt: 50 - 74%)  Comprehension Comprehension Mode: Auditory Comprehension: 5-Follows basic conversation/direction: With extra time/assistive device  Expression Expression Mode: Verbal Expression: 6-Expresses complex ideas: With extra time/assistive device  Social Interaction Social Interaction: 4-Interacts appropriately 75 - 89% of the time - Needs redirection for appropriate language or to initiate interaction.  Problem Solving Problem Solving Mode: Not assessed Problem Solving: 2-Solves basic 25 - 49% of the time - needs direction more than half the time to initiate, plan or complete simple activities  Memory Memory Mode: Not assessed Memory: 3-Recognizes or recalls 50 - 74% of the time/requires cueing 25 - 49% of the time Medical Problem List and Plan:  1. Recent CVA with left-sided weakness. Will attempt to obtain workup while at [facility]  2. DVT Prophylaxis/Anticoagulation: SCDs.Lovenox BID 3. Mood: Zoloft 50 mg daily. Provide emotional support and positive reinforcement, will ask neuropsych to assess,change to mirtazapine may stim appetite,  takes 50-75% meals on average although 100% 3/9 supper 4. Neuropsych: This patient Is capable of making decisions on his/her own behalf. Will assess for depression 5. Pain management. Vicodin as needed for headaches  6. Seizure disorder. Dilantin 100 mg 3 times daily. Check Dilantin level and monitor for signs of seizure  7. Hypothyroidism. Synthroid 125 mcg daily. We'll check old records for thyroid studies  8. BPH history of prostate cancer. Flomax/Casodex. Check PVRs x3  9. Cervical spinal stenosis without evidence of radiculopathy  10. Bilateral knee  pain osteoarthritis , continue tramadol for pain 11.  Left calf and thigh pain will check dopplers,Short segment post tibial DVT, no need for anti coag will monitor with serial doppler increase lovenox to 30mg  BID and recheck Duplex Friday 3/14    12.  Hyponatremia, d/c IV .9 NS cont FR, recheck BMET stable 129.  Monitor 13 Probable UTI now on cipro pending cx  LOS (Days) 13 A FACE TO FACE EVALUATION WAS PERFORMED  KIRSTEINS,ANDREW E 03/07/2013, 7:12 AM

## 2013-03-07 NOTE — Progress Notes (Signed)
Occupational Therapy Session Note  Patient Details  Name: [name] MRN: [id number] Date of Birth: [birth year]  Today's Date: 03/07/2013 Time: 1100-1200 Time Calculation (min): 60 min  Short Term Goals: Week 2:  OT Short Term Goal 1 (Week 2): Pt. will utilized LUE during dressing UB with minimal assist OT Short Term Goal 2 (Week 2): Pt. will dress UB with minimal assist OT Short Term Goal 3 (Week 2): Pt. will dress LB with moderate assist OT Short Term Goal 4 (Week 2): Pt will complete toilet transfer with mod assist using grab bar OT Short Term Goal 5 (Week 2): Pt will complete shower transfer with max assist  Skilled Therapeutic Interventions/Progress Updates:    Pt in bed resting and agreeable to engaging in bathing and dressing tasks w/c level at sink.  Pt required max assist and verbal cues for sequencing and squat pivot transfer bed->w/c.  Pt exhibits significant posterior lean and requires constant cues to remain forward.  Pt stated he is fearful when placing hands on sink to stand and while standing.  Explained to patient that it is preferable to push up from chair but in his situation using the sink facilitates his forward lean.  Therapist provided dycem to place on sink to provide traction for pulling up at sink.  Pt performed sit<>stand with mod A when allowed to pull up on sink.  Pt stood at sink with supervision for therapist to pull up pants and bathe perineal area. Pt required orientation of shirt and pants and verbal cues for sequencing.  Focus on activity tolerance, sequencing, transfers, sit<>stand, standing balance, and safety awareness.  Therapy Documentation Precautions:  Precautions Precautions: Fall Precaution Comments: L inatention Restrictions Weight Bearing Restrictions: No General:   Vital Signs:   Pain: Pain Assessment Pain Assessment: No/denies pain Pain Score: 0-No pain  See FIM for current functional status  Therapy/Group: Individual  Therapy  Rich Brave 03/07/2013, 12:05 PM

## 2013-03-08 ENCOUNTER — Inpatient Hospital Stay (HOSPITAL_COMMUNITY): Payer: Medicare Other | Admitting: Occupational Therapy

## 2013-03-08 ENCOUNTER — Inpatient Hospital Stay (HOSPITAL_COMMUNITY): Payer: Medicare Other | Admitting: Speech Pathology

## 2013-03-08 ENCOUNTER — Inpatient Hospital Stay (HOSPITAL_COMMUNITY): Payer: Medicare Other | Admitting: Physical Therapy

## 2013-03-08 DIAGNOSIS — G8110 Spastic hemiplegia affecting unspecified side: Secondary | ICD-10-CM

## 2013-03-08 DIAGNOSIS — I6330 Cerebral infarction due to thrombosis of unspecified cerebral artery: Secondary | ICD-10-CM

## 2013-03-08 LAB — URINE CULTURE

## 2013-03-08 LAB — ALBUMIN: Albumin: 3 g/dL — ABNORMAL LOW (ref 3.5–5.2)

## 2013-03-08 MED ORDER — CEPHALEXIN 500 MG PO CAPS
500.0000 mg | ORAL_CAPSULE | Freq: Three times a day (TID) | ORAL | Status: DC
  Administered 2013-03-08 – 2013-03-09 (×3): 500 mg via ORAL
  Filled 2013-03-08 (×6): qty 1

## 2013-03-08 NOTE — Progress Notes (Addendum)
Patient ID: [name], male   DOB: [birth year], 79 y.o.   MRN: [id number]  Subjective/Complaints: 79 year-old male with history of CAD, CABG who was recently evaluated in the emergency room 2 weeks ago at [facility] with weakness of his left arm and left leg noted to have acute infarct. By report he was subsequently transferred to [facility] after evaluation Plavix was added to aspirin. He did not receive TPA per report. Patient noted to have seizure and was loaded with intravenous Dilantin. Paperwork was very minimal due to workup being done at [facility]. He was later transferred back to [facility] for ongoing physical therapy and left-sided weakness secondary to stroke.  The patient has knee pain and takes tramadol at home for this.  The patient has a history of neck pain but not currently complaining. MRI in 2008 demonstrating multilevel cervical stenosis C3-C7 levels    No CP or SOB, no left leg pain Reviewed neuropsych note, no signs of depression, has been on Paxil since [facility] stay reduced yesterday Review of Systems  HENT: Positive for neck pain.   Musculoskeletal: Negative for joint pain.       Chronic neck pain  Neurological: Positive for focal weakness. Negative for seizures.    Objective: Vital Signs: Blood pressure 166/83, pulse 85, temperature 97.6 F (36.4 C), temperature source Oral, resp. rate 17, height 5\' 7"  (1.702 m), weight 77.61 kg (171 lb 1.6 oz), SpO2 98.00%. No results found. Results for orders placed during the hospital encounter of 02/22/13 (from the past 72 hour(s))  URINALYSIS, ROUTINE W REFLEX MICROSCOPIC     Status: Abnormal   Collection Time    03/05/13 10:02 AM      Result Value Range   Color, Urine YELLOW  YELLOW   APPearance HAZY (*) CLEAR   Specific Gravity, Urine 1.014  1.005 - 1.030   pH 7.5  5.0 - 8.0   Glucose, UA NEGATIVE  NEGATIVE mg/dL   Hgb urine dipstick NEGATIVE  NEGATIVE   Bilirubin Urine NEGATIVE   NEGATIVE   Ketones, ur NEGATIVE  NEGATIVE mg/dL   Protein, ur NEGATIVE  NEGATIVE mg/dL   Urobilinogen, UA 0.2  0.0 - 1.0 mg/dL   Nitrite POSITIVE (*) NEGATIVE   Leukocytes, UA TRACE (*) NEGATIVE  URINE CULTURE     Status: None   Collection Time    03/05/13 10:02 AM      Result Value Range   Specimen Description URINE, CATHETERIZED     Special Requests NONE     Culture  Setup Time 03/05/2013 10:38     Colony Count >=100,000 COLONIES/ML     Culture       Value: STAPHYLOCOCCUS SPECIES (COAGULASE NEGATIVE)     Note: RIFAMPIN AND GENTAMICIN SHOULD NOT BE USED AS SINGLE DRUGS FOR TREATMENT OF STAPH INFECTIONS.   Report Status 03/08/2013 FINAL     Organism ID, Bacteria STAPHYLOCOCCUS SPECIES (COAGULASE NEGATIVE)    URINE MICROSCOPIC-ADD ON     Status: Abnormal   Collection Time    03/05/13 10:02 AM      Result Value Range   WBC, UA 0-2  <3 WBC/hpf   RBC / HPF 0-2  <3 RBC/hpf   Bacteria, UA FEW (*) RARE   Urine-Other AMORPHOUS URATES/PHOSPHATES    PHENYTOIN LEVEL, TOTAL     Status: Abnormal   Collection Time    03/07/13  6:02 AM      Result Value Range   Phenytoin Lvl 3.2 (*) 10.0 -  20.0 ug/mL  BASIC METABOLIC PANEL     Status: Abnormal   Collection Time    03/07/13  6:02 AM      Result Value Range   Sodium 128 (*) 135 - 145 mEq/L   Potassium 4.0  3.5 - 5.1 mEq/L   Chloride 94 (*) 96 - 112 mEq/L   CO2 27  19 - 32 mEq/L   Glucose, Bld 94  70 - 99 mg/dL   BUN 11  6 - 23 mg/dL   Creatinine, Ser 9.56  0.50 - 1.35 mg/dL   Calcium 8.9  8.4 - 21.3 mg/dL   GFR calc non Af Amer 90 (*) >90 mL/min   GFR calc Af Amer >90  >90 mL/min   Comment:            The eGFR has been calculated     using the CKD EPI equation.     This calculation has not been     validated in all clinical     situations.     eGFR's persistently     <90 mL/min signify     possible Chronic Kidney Disease.      Vitals reviewed.  Constitutional: He is oriented to person, place, and time. He appears  well-developed.  Mood/affect flat HENT:  Head: Normocephalic.  Eyes: EOM are normal.  Neck: Neck supple. No thyromegaly present.  Cardiovascular: Normal rate and regular rhythm.  Pulmonary/Chest: Effort normal and breath sounds normal. No respiratory distress.  Abdominal: Soft. Bowel sounds are normal. He exhibits no distension.  Musculoskeletal: He exhibits no edema. No calf or thigh swelling or tenderness, neg Homan's Neurological: He is alert and oriented to person, place, and time.  Follows three step commands  Motor strength is 5/5 in the right deltoid, biceps, triceps, grip, hip flexor, knee extensors, ankle dorsiflexor and plantar flexor  Motor strength is 3 minus/5 in the left deltoid, biceps, triceps, grip, hip flexor, 4 minus in the knee extensor, 3 minus in ankle dorsiflexor and plantar flexor  Sensation is intact to light touch  There is evidence of tactile extinction on double simultaneous stimulation  There is evidence of left-sided neglect on visual confrontation testing  There is a right gaze preference  Verbal output is sparse but no evidence of dysarthria  Patient does have decreased sensation to light touch in the left. Midline chest incision well healed    Assessment/Plan: 1. Functional deficits secondary to R MCA distribution infarct which require 3+ hours per day of interdisciplinary therapy in a comprehensive inpatient rehab setting. Physiatrist is providing close team supervision and 24 hour management of active medical problems listed below. Physiatrist and rehab team continue to assess barriers to discharge/monitor patient progress toward functional and medical goals. FIM: FIM - Bathing Bathing Steps Patient Completed: Chest;Right Arm;Left Arm;Abdomen;Left upper leg;Right upper leg Bathing: 3: Mod-Patient completes 5-7 86f 10 parts or 50-74%  FIM - Upper Body Dressing/Undressing Upper body dressing/undressing steps patient completed: Thread/unthread right  sleeve of pullover shirt/dresss;Put head through opening of pull over shirt/dress Upper body dressing/undressing: 3: Mod-Patient completed 50-74% of tasks FIM - Lower Body Dressing/Undressing Lower body dressing/undressing steps patient completed: Thread/unthread right pants leg Lower body dressing/undressing: 1: Total-Patient completed less than 25% of tasks  FIM - Hotel manager Devices: Grab bar or rail for support Toileting: 1: Total-Patient completed zero steps, helper did all 3  FIM - Diplomatic Services operational officer Devices: Grab bars Toilet Transfers: 0-Activity did not occur  FIM -  Bed/Chair Transport planner Devices: Arm rests Bed/Chair Transfer: 3: Bed > Chair or W/C: Mod A (lift or lower assist);3: Chair or W/C > Bed: Mod A (lift or lower assist)  FIM - Locomotion: Wheelchair Distance: 150 Locomotion: Wheelchair: 3: Travels 150 ft or more: maneuvers on rugs and over door sills with moderate assistance  (Pt: 50 - 74%) FIM - Locomotion: Ambulation Locomotion: Ambulation Assistive Devices: Parallel bars Ambulation/Gait Assistance: 3: Mod assist Locomotion: Ambulation: 1: Travels less than 50 ft with moderate assistance (Pt: 50 - 74%)  Comprehension Comprehension Mode: Auditory Comprehension: 5-Follows basic conversation/direction: With no assist  Expression Expression Mode: Verbal Expression: 5-Expresses basic needs/ideas: With extra time/assistive device  Social Interaction Social Interaction: 4-Interacts appropriately 75 - 89% of the time - Needs redirection for appropriate language or to initiate interaction.  Problem Solving Problem Solving Mode: Not assessed Problem Solving: 3-Solves basic 50 - 74% of the time/requires cueing 25 - 49% of the time  Memory Memory Mode: Not assessed Memory: 4-Recognizes or recalls 75 - 89% of the time/requires cueing 10 - 24% of the time Medical Problem List and Plan:  1. Recent CVA  with left-sided weakness. Will attempt to obtain workup while at [facility]  2. DVT Prophylaxis/Anticoagulation: SCDs.Lovenox BID 3. Mood: Zoloft 50 mg daily. Provide emotional support and positive reinforcement, will ask neuropsych to assess,change to mirtazapine may stim appetite, takes 50-75% meals on average although 100% 3/9 supper 4. Neuropsych: This patient Is capable of making decisions on his/her own behalf. Will assess for depression 5. Pain management. Vicodin as needed for headaches  6. Seizure disorder. Dilantin 100 mg 3 times daily. Check Dilantin level and monitor for signs of seizure  7. Hypothyroidism. Synthroid 125 mcg daily. We'll check old records for thyroid studies  8. BPH history of prostate cancer. Flomax/Casodex. Check PVRs x3  9. Cervical spinal stenosis without evidence of radiculopathy  10. Bilateral knee pain osteoarthritis , continue tramadol for pain 11.  Left calf and thigh pain will check dopplers,Short segment post tibial DVT,exam stable, no need for anti coag will monitor with serial doppler increase lovenox to 30mg  BID and recheck Duplex Friday 3/14    12.  Hyponatremia, d/c IV .9 NS cont FR, recheck BMET stable 129.  Monitor 13 Probable UTI now on cipro pending cx  LOS (Days) 14 A FACE TO FACE EVALUATION WAS PERFORMED  KIRSTEINS,ANDREW E 03/08/2013, 7:11 AM

## 2013-03-08 NOTE — Progress Notes (Signed)
Speech Language Pathology Daily Session Note  Patient Details  Name: Natalie Leclaire MRN: [id number] Date of Birth: 05-28-1931  Today's Date: 03/08/2013 Time: 1500-1530 Time Calculation (min): 30 min  Short Term Goals: Week 2: SLP Short Term Goal 1 (Week 2): Patient will attent to left environment during reading and writing task with supervision level verbal cues. SLP Short Term Goal 2 (Week 2): Patient will attend to and utilize left upper extremity during functional tasks with mod assist question cues. SLP Short Term Goal 3 (Week 2): Patient will utilize external aids to recall new information with supervision level question cues. SLP Short Term Goal 4 (Week 2): Patient will solve moderately complex problems with min assist verbal cues.   SLP Short Term Goal 5 (Week 2): Patient will sustain attention to functional task for 15 miutes with min assist vebral cues for re-direction.  Skilled Therapeutic Interventions: Skilled treatment session focused on addressing cognitive-linguistic goals. SLP facilitated session by providing Max verbal, question and visual to attend to left field of environment while propelling his wheelchair. SLP also facilitated session by providing Min A question cues for recall of rules to a previous task. Pt required Min question cues for functional problem solving throughout the task.    FIM:  Comprehension Comprehension Mode: Auditory Comprehension: 4-Understands basic 75 - 89% of the time/requires cueing 10 - 24% of the time Expression Expression Mode: Verbal Expression: 4-Expresses basic 75 - 89% of the time/requires cueing 10 - 24% of the time. Needs helper to occlude trach/needs to repeat words. Social Interaction Social Interaction: 4-Interacts appropriately 75 - 89% of the time - Needs redirection for appropriate language or to initiate interaction. Problem Solving Problem Solving: 3-Solves basic 50 - 74% of the time/requires cueing 25 - 49% of the  time Memory Memory: 4-Recognizes or recalls 75 - 89% of the time/requires cueing 10 - 24% of the time  Pain Pain Assessment Pain Assessment: No/denies pain    Therapy/Group: Individual Therapy  PAYNE, COURTNEY 03/08/2013, 4:39 PM  Feliberto Gottron, MA, CCC-SLP [phone]

## 2013-03-08 NOTE — Progress Notes (Signed)
Physical Therapy Weekly Progress Note  Patient Details  Name: [name] MRN: [id number] Date of Birth: 1931/11/08  Today's Date: 03/08/2013 Time: 1530-1630 Time Calculation (min): 60 min  Patient has made good progress during week 2 and has met 1 of 4 short term goals.  Patient is currently min-mod A for w/c mobility in controlled environment with bilat foot propulsion with assistance for attention to L environment and attention to and sequencing LLE, mod-max A overall for stand pivots with UE support bed <> w/c, for gait with EVA walker to maintain upright posture or RW short distances in controlled environment, and for stair negotiation with bilat UE support on rails.    Patient continues to demonstrate the following deficits: pain in low back and bilat knees, fear of falling, pushing to L and posterior in standing, L hemiplegia with motor impersistence, impaired motor control, timing, sequencing, apraxia, L inattention, impaired awareness, postural control, dynamic sitting and standing balance, gait and therefore will continue to benefit from skilled PT intervention to enhance overall performance with activity tolerance, balance, postural control, ability to compensate for deficits, functional use of  left upper extremity and left lower extremity, attention, awareness and coordination.  Patient progressing toward long term goals..  Continue plan of care.  PT Short Term Goals Week 1:  PT Short Term Goal 1 (Week 1): No goals set for week 1 Week 2:  PT Short Term Goal 1 (Week 2): Patient will perform bed mobility to L and R with mod A on flat bed PT Short Term Goal 1 - Progress (Week 2): Progressing toward goal PT Short Term Goal 2 (Week 2): Patient will perform bed <> w/c transfers stand or squat pivot to L and R mod A consistently PT Short Term Goal 2 - Progress (Week 2): Progressing toward goal PT Short Term Goal 3 (Week 2): Patient will performed w/c mobility in controlled environment x  150' wtih supervision and intermittent verbal cues to attend to L environment PT Short Term Goal 3 - Progress (Week 2): Progressing toward goal PT Short Term Goal 4 (Week 2): Patient will perform gait in controlled environment x 15' with LRAD and max A  PT Short Term Goal 4 - Progress (Week 2): Met Week 3:  PT Short Term Goal 1 (Week 3): Patient will perform bed mobility to L and R with mod A on flat bed PT Short Term Goal 2 (Week 3): Patient will perform bed <> w/c transfers stand or squat pivot to L and R mod A consistently PT Short Term Goal 3 (Week 3): Patient will performed w/c mobility in controlled environment x 150' wtih supervision and intermittent verbal cues to attend to L environment PT Short Term Goal 4 (Week 3): Patient will ambulate in controlled environment x 25' with RW and mod A  PT Short Term Goal 5 (Week 3): Patient will perform up and down 4 stairs with 2 rails and mod A  Therapy Documentation Precautions:  Precautions Precautions: Fall Precaution Comments: L inatention Restrictions Weight Bearing Restrictions: No Pain: Pain Assessment Pain Assessment: No/denies pain Pain Score:   3 Pain Type: Chronic pain Pain Location: Back Pain Orientation: Lower Pain Descriptors: Aching Pain Frequency: Occasional Pain Onset: With Activity Patients Stated Pain Goal: 2 Pain Intervention(s): RN made aware Multiple Pain Sites: No Locomotion : Ambulation Ambulation/Gait Assistance: 2: Max assist x 15' x 2 reps with RW with assistance to safely navigate RW and pivot RW and verbal cues to maintain safe distance to RW,  tactile and verbal cues for upright trunk posture to bring COG over BOS and for R lateral weight shifting with verbal cues for full LLE step length and lateral and retro stepping to pivot to chair.  Patient continues to attempt to sit prior to fully pivoting.  Had discussion with patient about what his goal is for home; patient reports his goal is to be walking around  with the RW but did not see how current deficits and fear of falling are interfering with progress towards this goal.  Wheelchair Mobility Distance: 150 in controlled environment with bilat foot propulsion with assistance for attention to L environment and attention to and sequencing LLE, Exercises:  Performed bilat UE and LE strengthening, endurance and coordination training in gravity minimized position on Nustep at level 4 x 9 minutes with focus on full extension through LUE and LLE and stretching of bilat knees into flexion and ankles into DF.  At end of Nustep patient reporting need for pain meds first reporting knee pain and then reporting low back pain. Other Treatments: Treatments Neuromuscular Facilitation: Left;Upper Extremity;Lower Extremity;Forced use;Activity to increase motor control;Activity to increase timing and sequencing;Activity to increase sustained activation;Activity to increase lateral weight shifting;Activity to increase anterior-posterior weight shifting in sitting with sheet around low back to facilitate lumbar lordosis and anterior pelvic tilt during reaching with R and LUE in various directions out of BOS to facilitate lateral weight shifting, trunk elongation and shortening and to bring COG over BOS; progressed to patient placing bilat UE on therapist's shoulders with sheet around low back during anterior lean pushing into therapist and coming to stand x 3 reps; patient presents with pushing to L with RLE and posterior and L lateral lean with retracted pelvis; patient able to minimally activate LLE extension to weight shift over RLE but when manual assisted to weight shift R patient becomes very fearful of falling and sits quickly.    See FIM for current functional status  Therapy/Group: Individual Therapy  [name] [facility] 03/08/2013, 4:50 PM

## 2013-03-08 NOTE — Progress Notes (Signed)
Occupational Therapy Weekly Progress Note and Treatment Session Note  Patient Details  Name: [name] MRN: [id number] Date of Birth: 1931/12/22  Today's Date: 03/08/2013 Time: 1035-1130 and 1415-1500 Time Calculation (min): 55 min and 45 min  Patient has met 4 of 5 short term goals.  Pt is showing steady progress towards goals.  He requires max encouragement to participate and benefits from use of grab bar to facilitate forward lean and increase security with sit <> stand, as pt is very fearful of falling.  Pt has progressed to mod assist toilet and shower transfer with use of grab bar, however continues to require max assist with bed <> w/c transfers due to decreased UE support.  Patient continues to demonstrate the following deficits: impaired activity tolerance, decreased attention to Lt and task, Lt sided hemiplegia, impaired motor control, timing, sequencing, apraxia, impaired trunk and postural control, dynamic balance with tendency to push posterior in sitting and standing and therefore will continue to benefit from skilled OT intervention to enhance overall performance with BADL and Reduce care partner burden.  Patient progressing toward long term goals..  Continue plan of care.  OT Short Term Goals Week 2:  OT Short Term Goal 1 (Week 2): Pt. will utilized LUE during dressing UB with minimal assist OT Short Term Goal 1 - Progress (Week 2): Met OT Short Term Goal 2 (Week 2): Pt. will dress UB with minimal assist OT Short Term Goal 2 - Progress (Week 2): Met OT Short Term Goal 3 (Week 2): Pt. will dress LB with moderate assist OT Short Term Goal 3 - Progress (Week 2): Progressing toward goal OT Short Term Goal 4 (Week 2): Pt will complete toilet transfer with mod assist using grab bar OT Short Term Goal 4 - Progress (Week 2): Met OT Short Term Goal 5 (Week 2): Pt will complete shower transfer with max assist OT Short Term Goal 5 - Progress (Week 2): Met Week 3:  OT Short Term  Goal 1 (Week 3): Pt will complete bathing with min assist at sit <> stand level OT Short Term Goal 2 (Week 3): Pt will complete UB dressing with supervision OT Short Term Goal 3 (Week 3): Pt. will dress LB with moderate assist OT Short Term Goal 4 (Week 3): Pt will complete toilet transfer with min assist with use of grab bar OT Short Term Goal 5 (Week 3): Pt will complete shower transfer with min assist  Skilled Therapeutic Interventions/Progress Updates:    1) Pt seen for ADL retraining with focus on transfers with and without UE support (grab bar), increased participation with bathing and dressing, and education on hemi-dressing techniques.  Pt required max assist and verbal cues for sequencing with squat pivot transfer from bed to w/c (without UE support), pt very fearful of falling therefore has decreased weight shifting with transfers.  Walk-in shower transfer with use of grab bars and shower seat with pt completing stand pivot transfer mod assist.  Pt continues to require max encouragement for participation with self-care tasks.  Engaged in dressing at sink with use of dycem on sink to increase stability in standing.  Pt required backward chaining with pants and orientation of shirt and pants.  2) Pt seen for 1:1 OT with focus on transfers and grooming tasks with increased attention to Lt side.  Pt seated in w/c upon arrival slumped down and reports attempting to get comfortable to attempt to utilize urinal.  Pt refused use of toilet, so engaged in stand  pivot transfer to bed to attempt to urinate.  Pt with decreased sustained attention and required increased time to void.  Engaged in bed mobility and focus on sidelying to sit from flat bed and stand pivot transfer bed > w/c with use of reaching with LUE for w/c handle to assist with weight shift.  Pt completed hand hygiene and engaged in shaving at sink with focus on obtaining items at Lt with Lt hand as able.    Therapy  Documentation Precautions:  Precautions Precautions: Fall Precaution Comments: L inatention Restrictions Weight Bearing Restrictions: No Pain: Pain Assessment Pain Assessment: No/denies pain ADL: ADL Grooming: Minimal assistance Where Assessed-Grooming: Sitting at sink Upper Body Bathing: Setup Where Assessed-Upper Body Bathing: Shower Lower Body Bathing: Moderate assistance Where Assessed-Lower Body Bathing: Shower Upper Body Dressing: Minimal assistance Where Assessed-Upper Body Dressing: Sitting at sink Lower Body Dressing: Maximal assistance Where Assessed-Lower Body Dressing: Sitting at sink;Standing at sink Toileting: Maximal assistance Where Assessed-Toileting: Teacher, adult education: Moderate assistance Toilet Transfer Method: Stand pivot Toilet Transfer Equipment: Raised toilet seat;Grab bars Tub/Shower Transfer: Not assessed Film/video editor: Moderate assistance Film/video editor Method: Warden/ranger: Shower seat with back;Grab bars  See FIM for current functional status  Therapy/Group: Individual Therapy  [name] 03/08/2013, 12:20 PM

## 2013-03-08 NOTE — Progress Notes (Signed)
Social Work Patient ID: [name], male   DOB: 01-Nov-1931, 79 y.o.   MRN: [id number] Met with pt and spoke with daughter via telephone to inform team conference update-progression toward goals and discharge 3/27. Daughter continues to plan to provide care to pt at discharge.  Discussed coming in and beginning hands on therapies with Dad. She will check her schedule and get back with worker.  His housekeeper also will be assisting him at discharge.  Good for daughter To begin early so she is comfortable with pt's care and he is comfortable also.  He continues to discuss hiring caregiver's which daughter Reports will supplement her.  Continue to work on discharge plans.  Aware he will require 24 hour care at discharge.

## 2013-03-09 ENCOUNTER — Inpatient Hospital Stay (HOSPITAL_COMMUNITY): Payer: Medicare Other | Admitting: Speech Pathology

## 2013-03-09 ENCOUNTER — Inpatient Hospital Stay (HOSPITAL_COMMUNITY): Payer: Medicare Other | Admitting: Physical Therapy

## 2013-03-09 ENCOUNTER — Inpatient Hospital Stay (HOSPITAL_COMMUNITY): Payer: Medicare Other | Admitting: Occupational Therapy

## 2013-03-09 DIAGNOSIS — I82409 Acute embolism and thrombosis of unspecified deep veins of unspecified lower extremity: Secondary | ICD-10-CM

## 2013-03-09 MED ORDER — NITROFURANTOIN MACROCRYSTAL 50 MG PO CAPS
50.0000 mg | ORAL_CAPSULE | Freq: Four times a day (QID) | ORAL | Status: AC
  Administered 2013-03-09 – 2013-03-15 (×28): 50 mg via ORAL
  Filled 2013-03-09 (×28): qty 1

## 2013-03-09 MED ORDER — PHENYTOIN SODIUM EXTENDED 100 MG PO CAPS
200.0000 mg | ORAL_CAPSULE | Freq: Two times a day (BID) | ORAL | Status: DC
  Administered 2013-03-09 – 2013-03-12 (×8): 200 mg via ORAL
  Filled 2013-03-09 (×11): qty 2

## 2013-03-09 NOTE — Progress Notes (Addendum)
Patient ID: [name], male   DOB: Jul 28, 1931, 79 y.o.   MRN: [id number]  Subjective/Complaints: 79 year-old male with history of CAD, CABG who was recently evaluated in the emergency room 2 weeks ago at [facility] with weakness of his left arm and left leg noted to have acute infarct. By report he was subsequently transferred to [facility] after evaluation Plavix was added to aspirin. He did not receive TPA per report. Patient noted to have seizure and was loaded with intravenous Dilantin. Paperwork was very minimal due to workup being done at [facility]. He was later transferred back to [facility] for ongoing physical therapy and left-sided weakness secondary to stroke.  The patient has knee pain and takes tramadol at home for this.  The patient has a history of neck pain but not currently complaining. MRI in 2008 demonstrating multilevel cervical stenosis C3-C7 levels    No CP or SOB, no left leg pain Reviewed neuropsych note, no signs of depression, has been on Paxil since [facility] stay reduced yesterday No seizures Low DPH + nitrates on UA, culture + Staph coag neg Review of Systems  HENT: Positive for neck pain.   Musculoskeletal: Negative for joint pain.       Chronic neck pain  Neurological: Positive for focal weakness. Negative for seizures.    Objective: Vital Signs: Blood pressure 137/74, pulse 83, temperature 98.7 F (37.1 C), temperature source Oral, resp. rate 18, height 5\' 7"  (1.702 m), weight 77.61 kg (171 lb 1.6 oz), SpO2 100.00%. No results found. Results for orders placed during the hospital encounter of 02/22/13 (from the past 72 hour(s))  PHENYTOIN LEVEL, TOTAL     Status: Abnormal   Collection Time    03/07/13  6:02 AM      Result Value Range   Phenytoin Lvl 3.2 (*) 10.0 - 20.0 ug/mL  BASIC METABOLIC PANEL     Status: Abnormal   Collection Time    03/07/13  6:02 AM      Result Value Range   Sodium 128 (*) 135 - 145 mEq/L   Potassium 4.0  3.5 - 5.1 mEq/L   Chloride 94 (*) 96 - 112 mEq/L   CO2 27  19 - 32 mEq/L   Glucose, Bld 94  70 - 99 mg/dL   BUN 11  6 - 23 mg/dL   Creatinine, Ser 4.09  0.50 - 1.35 mg/dL   Calcium 8.9  8.4 - 81.1 mg/dL   GFR calc non Af Amer 90 (*) >90 mL/min   GFR calc Af Amer >90  >90 mL/min   Comment:            The eGFR has been calculated     using the CKD EPI equation.     This calculation has not been     validated in all clinical     situations.     eGFR's persistently     <90 mL/min signify     possible Chronic Kidney Disease.  ALBUMIN     Status: Abnormal   Collection Time    03/08/13  1:45 PM      Result Value Range   Albumin 3.0 (*) 3.5 - 5.2 g/dL      Vitals reviewed.  Constitutional: He is oriented to person, place, and time. He appears well-developed.  Mood/affect flat HENT:  Head: Normocephalic.  Eyes: EOM are normal.  Neck: Neck supple. No thyromegaly present.  Cardiovascular: Normal rate and regular rhythm.  Pulmonary/Chest: Effort  normal and breath sounds normal. No respiratory distress.  Abdominal: Soft. Bowel sounds are normal. He exhibits no distension.  Musculoskeletal: He exhibits no edema. No calf or thigh swelling or tenderness, neg Homan's Neurological: He is alert and oriented to person, place, and time.  Follows three step commands  Motor strength is 5/5 in the right deltoid, biceps, triceps, grip, hip flexor, knee extensors, ankle dorsiflexor and plantar flexor  Motor strength is 3 minus/5 in the left deltoid, biceps, triceps, grip, hip flexor, 4 minus in the knee extensor, 3 minus in ankle dorsiflexor and plantar flexor  Sensation is intact to light touch  There is evidence of tactile extinction on double simultaneous stimulation  There is evidence of left-sided neglect on visual confrontation testing  There is a right gaze preference  Verbal output is sparse but no evidence of dysarthria  Patient does have decreased sensation to light touch  in the left. Midline chest incision well healed    Assessment/Plan: 1. Functional deficits secondary to R MCA distribution infarct which require 3+ hours per day of interdisciplinary therapy in a comprehensive inpatient rehab setting. Physiatrist is providing close team supervision and 24 hour management of active medical problems listed below. Physiatrist and rehab team continue to assess barriers to discharge/monitor patient progress toward functional and medical goals. FIM: FIM - Bathing Bathing Steps Patient Completed: Chest;Right Arm;Left Arm;Abdomen;Front perineal area;Right upper leg;Left upper leg Bathing: 3: Mod-Patient completes 5-7 17f 10 parts or 50-74%  FIM - Upper Body Dressing/Undressing Upper body dressing/undressing steps patient completed: Thread/unthread right sleeve of pullover shirt/dresss;Thread/unthread left sleeve of pullover shirt/dress;Put head through opening of pull over shirt/dress Upper body dressing/undressing: 4: Min-Patient completed 75 plus % of tasks FIM - Lower Body Dressing/Undressing Lower body dressing/undressing steps patient completed: Thread/unthread right underwear leg;Thread/unthread left underwear leg;Thread/unthread right pants leg Lower body dressing/undressing: 2: Max-Patient completed 25-49% of tasks  FIM - Hotel manager Devices: Grab bar or rail for support Toileting: 1: Total-Patient completed zero steps, helper did all 3  FIM - Diplomatic Services operational officer Devices: Grab bars Toilet Transfers: 3-To toilet/BSC: Mod A (lift or lower assist);3-From toilet/BSC: Mod A (lift or lower assist)  FIM - Bed/Chair Transfer Bed/Chair Transfer Assistive Devices: Arm rests Bed/Chair Transfer: 2: Bed > Chair or W/C: Max A (lift and lower assist);2: Chair or W/C > Bed: Max A (lift and lower assist)  FIM - Locomotion: Wheelchair Distance: 150 Locomotion: Wheelchair: 3: Travels 150 ft or more: maneuvers on rugs and over  door sills with moderate assistance  (Pt: 50 - 74%) FIM - Locomotion: Ambulation Locomotion: Ambulation Assistive Devices: Designer, industrial/product Ambulation/Gait Assistance: 2: Max assist Locomotion: Ambulation: 1: Travels less than 50 ft with maximal assistance (Pt: 25 - 49%)  Comprehension Comprehension Mode: Auditory Comprehension: 4-Understands basic 75 - 89% of the time/requires cueing 10 - 24% of the time  Expression Expression Mode: Verbal Expression: 4-Expresses basic 75 - 89% of the time/requires cueing 10 - 24% of the time. Needs helper to occlude trach/needs to repeat words.  Social Interaction Social Interaction: 4-Interacts appropriately 75 - 89% of the time - Needs redirection for appropriate language or to initiate interaction.  Problem Solving Problem Solving Mode: Not assessed Problem Solving: 3-Solves basic 50 - 74% of the time/requires cueing 25 - 49% of the time  Memory Memory Mode: Not assessed Memory: 4-Recognizes or recalls 75 - 89% of the time/requires cueing 10 - 24% of the time Medical Problem List and Plan:  1.  Recent CVA with left-sided weakness. Will attempt to obtain workup while at [facility]  2. DVT Prophylaxis/Anticoagulation: SCDs.Lovenox BID 3. Mood: Zoloft 25, takes 50-75% meals on average although 100% 3/9 supper 4. Neuropsych: This patient Is capable of making decisions on his/her own behalf. Will assess for depression 5. Pain management. Vicodin as needed for headaches  6. Seizure disorder. Dilantin 100 mg 3 times daily. low Dilantin level increase to 200mg  BID monitor toxicity   7. Hypothyroidism. Synthroid 125 mcg daily. We'll check old records for thyroid studies  8. BPH history of prostate cancer. Flomax/Casodex. Check PVRs x3  9. Cervical spinal stenosis without evidence of radiculopathy  10. Bilateral knee pain osteoarthritis , continue tramadol for pain 11.  Left calf and thigh pain will check dopplers,Short segment post tibial DVT,exam  stable, no need for anti coag will monitor with serial doppler increase lovenox to 30mg  BID and recheck Duplex Friday 3/14    12.  Hyponatremia, d/c IV .9 NS cont FR, recheck BMET stable 128.  Monitor 13  UTI start Nitrofurantoin  LOS (Days) 15 A FACE TO FACE EVALUATION WAS PERFORMED  KIRSTEINS,ANDREW E 03/09/2013, 6:51 AM

## 2013-03-09 NOTE — Progress Notes (Addendum)
*  PRELIMINARY RESULTS* Vascular Ultrasound Left lower extremity venous duplex has been completed.  Preliminary findings: Left:  Evidence of DVT involving a small segment of the Left PTV in mid calf. Consistent with studies on 02/26/13 and 03/03/13.  New DVT noted in left common femoral vein.  Farrel Demark, RDMS, RVT  03/09/2013, 3:40 PM

## 2013-03-09 NOTE — Progress Notes (Signed)
Occupational Therapy Session Note  Patient Details  Name: [name] MRN: [id number] Date of Birth: 1931-04-06  Today's Date: 03/09/2013 Time: 1112-1208 and 1308-6578 Time Calculation (min): 56 min and 45 min  Short Term Goals: Week 2:  OT Short Term Goal 1 (Week 2): Pt. will utilized LUE during dressing UB with minimal assist OT Short Term Goal 1 - Progress (Week 2): Met OT Short Term Goal 2 (Week 2): Pt. will dress UB with minimal assist OT Short Term Goal 2 - Progress (Week 2): Met OT Short Term Goal 3 (Week 2): Pt. will dress LB with moderate assist OT Short Term Goal 3 - Progress (Week 2): Progressing toward goal OT Short Term Goal 4 (Week 2): Pt will complete toilet transfer with mod assist using grab bar OT Short Term Goal 4 - Progress (Week 2): Met OT Short Term Goal 5 (Week 2): Pt will complete shower transfer with max assist OT Short Term Goal 5 - Progress (Week 2): Met  Skilled Therapeutic Interventions/Progress Updates:    1) Pt seen for ADL retraining with focus on initiation, sequencing, and orientation with bathing and dressing tasks.  Pt seated in bed with urinal in place with reports of attempting to urinate.  Pt unable to void and willing to participate in therapy session.  Pt completed bathing at sink per pt's request.  Engaged in sit <> stand x4 for LB bathing and dressing with use of dycem on sink to increase grip and stability as pt pulls up on sink secondary to fearfulness and decreased ability to weight shift forward.  Pt required mod verbal cues for orientation of shirt and pants.  2) Pt seen for 1:1 OT with focus on functional use of LUE, scanning to Lt, and recall of new information. Pt in bed upon arrival, reporting he is waiting for doppler.  Discussed with RN who reports to continue with therapy as planned but stay in room if possible.  Engaged in card sequencing activity with focus on use of Lt hand for flipping cards and placing them on stack.  Increased  challenge with moving cards further to Lt to promote further scanning and attention to Lt, pt with difficulty scanning to farthest card on Lt requiring cues to further scan.  Pt required increased time to scan and utilize LUE.  Therapy Documentation Precautions:  Precautions Precautions: Fall Precaution Comments: L inatention Restrictions Weight Bearing Restrictions: No Pain: Pain Assessment Pain Assessment: No/denies pain  See FIM for current functional status  Therapy/Group: Individual Therapy  [name] 03/09/2013, 12:13 PM

## 2013-03-09 NOTE — Progress Notes (Signed)
Speech Language Pathology Daily Session Note & Weekly Progress Summary  Patient Details  Name: [name] MRN: [id number] Date of Birth: [birth year]  Today's Date: 03/09/2013 Time: 0903-0928 Time Calculation (min): 25 min  Short Term Goals: Week 2: SLP Short Term Goal 1 (Week 2): Patient will attent to left environment during reading and writing task with supervision level verbal cues. SLP Short Term Goal 1 - Progress (Week 2): Met SLP Short Term Goal 2 (Week 2): Patient will attend to and utilize left upper extremity during functional tasks with mod assist question cues. SLP Short Term Goal 2 - Progress (Week 2): Progressing toward goal SLP Short Term Goal 3 (Week 2): Patient will utilize external aids to recall new information with supervision level question cues. SLP Short Term Goal 3 - Progress (Week 2): Met SLP Short Term Goal 4 (Week 2): Patient will solve moderately complex problems with min assist verbal cues.   SLP Short Term Goal 4 - Progress (Week 2): Progressing toward goal SLP Short Term Goal 5 (Week 2): Patient will sustain attention to functional task for 15 miutes with min assist vebral cues for re-direction. SLP Short Term Goal 5 - Progress (Week 2): Progressing toward goal  Skilled Therapeutic Interventions: Skilled treatment session focused on addressing cognitive-linguistic goals. SLP facilitated session by providing max verbal, question and visual to attend to left upper extremity and locate items on left side of room.  SLP also facilitated session by providing mod faded to min assist question cues to problem solve use of menu to locate daily selections.     FIM:  Comprehension Comprehension Mode: Auditory Comprehension: 4-Understands basic 75 - 89% of the time/requires cueing 10 - 24% of the time Expression Expression Mode: Verbal Expression: 4-Expresses basic 75 - 89% of the time/requires cueing 10 - 24% of the time. Needs helper to occlude trach/needs to  repeat words. Social Interaction Social Interaction: 4-Interacts appropriately 75 - 89% of the time - Needs redirection for appropriate language or to initiate interaction. Problem Solving Problem Solving: 3-Solves basic 50 - 74% of the time/requires cueing 25 - 49% of the time Memory Memory: 4-Recognizes or recalls 75 - 89% of the time/requires cueing 10 - 24% of the time  Pain Pain Assessment Pain Assessment: No/denies pain  Therapy/Group: Individual Therapy   Speech Language Pathology Weekly Progress Note  Patient Details  Name: [name] MRN: [id number] Date of Birth: 04/20/1931  Today's Date: 03/09/2013  Short Term Goals: Week 2: SLP Short Term Goal 1 (Week 2): Patient will attent to left environment during reading and writing task with supervision level verbal cues. SLP Short Term Goal 1 - Progress (Week 2): Met SLP Short Term Goal 2 (Week 2): Patient will attend to and utilize left upper extremity during functional tasks with mod assist question cues. SLP Short Term Goal 2 - Progress (Week 2): Progressing toward goal SLP Short Term Goal 3 (Week 2): Patient will utilize external aids to recall new information with supervision level question cues. SLP Short Term Goal 3 - Progress (Week 2): Met SLP Short Term Goal 4 (Week 2): Patient will solve moderately complex problems with min assist verbal cues.   SLP Short Term Goal 4 - Progress (Week 2): Progressing toward goal SLP Short Term Goal 5 (Week 2): Patient will sustain attention to functional task for 15 miutes with min assist vebral cues for re-direction. SLP Short Term Goal 5 - Progress (Week 2): Progressing toward goal Week 3: SLP Short Term Goal 1 (  Week 3): Patient will attend to and utilize left upper extremity during functional tasks with mod assist question cues. SLP Short Term Goal 2 (Week 3): Patient will attent to left environment during functional tasks with mod assist verbal cues. SLP Short Term Goal 3 (Week  3): Patient will solve moderately complex problems with min assist verbal cues to self monitor and correct.   SLP Short Term Goal 4 (Week 3): Patient will select attention to propelling wheelchair to speech room with mod assist vebral cues for redirection.   Weekly Progress Updates: Patient met 2 out of 5 short term objectives this reporting period due to gains in use of external aids for recall of daily information and left attention during structured reading and writing task with supervision level assist.  Given severity of cognitive deficits and need for supervision at discharge these goals are met.  Patient continues to exhibit decreased attention to broader tasks that require patient to block out environmental distractions.  Patient's deficits: inattention to left, decreased awareness, problem solving, self monitoring and correcting.  As a result, patient continues to require skilled SLP services to maximize functional independence prior to discharge home to reduce burden of care.  SLP Intensity: Minumum of 1-2 x/day, 30 to 90 minutes SLP Frequency: 5 out of 7 days SLP Duration/Estimated Length of Stay: 03/22/13 SLP Treatment/Interventions: Cognitive remediation/compensation;Cueing hierarchy;Environmental controls;Functional tasks;Internal/external aids;Patient/family education;Therapeutic Activities  Charlane Ferretti., CCC-SLP [phone]  [name],[name] 03/09/2013, 9:32 AM

## 2013-03-09 NOTE — Progress Notes (Signed)
Physical Therapy Session Note  Patient Details  Name: [name] MRN: [id number] Date of Birth: 11/28/1931  Today's Date: 03/09/2013 Time: 1300-1400 Time Calculation (min): 60 min  Short Term Goals: Week 1:  PT Short Term Goal 1 (Week 1): No goals set for week 1 Week 2:  PT Short Term Goal 1 (Week 2): Patient will perform bed mobility to L and R with mod A on flat bed PT Short Term Goal 1 - Progress (Week 2): Progressing toward goal PT Short Term Goal 2 (Week 2): Patient will perform bed <> w/c transfers stand or squat pivot to L and R mod A consistently PT Short Term Goal 2 - Progress (Week 2): Progressing toward goal PT Short Term Goal 3 (Week 2): Patient will performed w/c mobility in controlled environment x 150' wtih supervision and intermittent verbal cues to attend to L environment PT Short Term Goal 3 - Progress (Week 2): Progressing toward goal PT Short Term Goal 4 (Week 2): Patient will perform gait in controlled environment x 15' with LRAD and max A  PT Short Term Goal 4 - Progress (Week 2): Met Week 3:  PT Short Term Goal 1 (Week 3): Patient will perform bed mobility to L and R with mod A on flat bed PT Short Term Goal 2 (Week 3): Patient will perform bed <> w/c transfers stand or squat pivot to L and R mod A consistently PT Short Term Goal 3 (Week 3): Patient will performed w/c mobility in controlled environment x 150' wtih supervision and intermittent verbal cues to attend to L environment PT Short Term Goal 4 (Week 3): Patient will ambulate in controlled environment x 25' with RW and mod A  PT Short Term Goal 5 (Week 3): Patient will perform up and down 4 stairs with 2 rails and mod A  Therapy Documentation Precautions:  Precautions Precautions: Fall Precaution Comments: L inatention Restrictions Weight Bearing Restrictions: No Pain: Pain Assessment Pain Assessment: No/denies pain Other Treatments: Patient awaiting LE dopplers to re-assess DVT.  Patient in bed  with no pants donned but with brief donned; patient agreed to therapy in the room while awaiting LE dopplers.  Treatments Neuromuscular Facilitation: Left;Lower Extremity;Upper Extremity;Forced use;Activity to increase motor control;Activity to increase timing and sequencing;Activity to increase sustained activation;Activity to increase lateral weight shifting;Activity to increase anterior-posterior weight shifting seated EOB with reaching with RUE in various directions out of BOS for rings to facilitate trunk and postural control and anterior weight shift during anterior and lateral leans; transitioned to reaching up, forwards and to the R with RUE to place rings on tall target and coming in to squat position while reaching to facilitate LLE activation and extension and R lateral weight shift; patient only able to complete one squat with full R lateral weight shift, all other attempts resulted in patient pushing back with RLE or scooting towards target.  Attempted standing R lateral weight shifting with same reaching task to tall target but patient unable to shift weight to R to reach target secondary to fear and anxiety with shifting weight.  Patient asking how he can overcome his fear of falling; reports that when he had his CVA he almost fell and is now feels that same feeling and fear whenever he is up on his feet and moving.  Encouraged patient that therapy staff's goal is to progress him and to do that we often have to put patient in challenging tasks and encouraged patient to work through that fear with therapy  in a safe environment.  Performed lateral scoots to L and R with manual and verbal cues at trunk for elongation during full anterior lean and manual facilitation at femur for anterior translation to come to squat and LE extension to pivot to R or L with min-mod A.  Performed sit > supine with total A secondary to fear of being in sidelying.    See FIM for current functional  status  Therapy/Group: Individual Therapy  [name] [facility] 03/09/2013, 3:35 PM

## 2013-03-10 ENCOUNTER — Inpatient Hospital Stay (HOSPITAL_COMMUNITY): Payer: Medicare Other | Admitting: *Deleted

## 2013-03-10 MED ORDER — ENOXAPARIN SODIUM 80 MG/0.8ML ~~LOC~~ SOLN
80.0000 mg | Freq: Two times a day (BID) | SUBCUTANEOUS | Status: DC
  Administered 2013-03-10 – 2013-03-11 (×3): 80 mg via SUBCUTANEOUS
  Filled 2013-03-10 (×7): qty 0.8

## 2013-03-10 NOTE — Progress Notes (Signed)
Patient ID: [name], male   DOB: Oct 21, 1931, 79 y.o.   MRN: [id number] Patient ID: [name], male   DOB: 21-Dec-1931, 79 y.o.   MRN: [id number]  Subjective/Complaints:   70/44.  79 year-old male with history of CAD, CABG who was was admitted for evaluation and treatment of an acute stroke. Medical problems include hypertension next dyslipidemia. Remote history of prostate cancer.  Lower extremity venous Doppler study yesterday revealed a new thrombus in the left common femoral vein  BP Readings from Last 3 Encounters:  03/10/13 165/93  11/15/12 145/98  05/03/12 132/78    Gen- no distress ENT- unremarkable Chest-clear CV- no tachy Abd- benign; no distension Extr- no signif swelling or calf tenderness  L DVT-  Full heparinization S/p CVA HTN- slightly elevated today- will observe   No CP or SOB, no left leg pain Reviewed neuropsych note, no signs of depression, has been on Paxil since [facility] stay reduced yesterday No seizures Low DPH + nitrates on UA, culture + Staph coag neg Review of Systems  HENT: Positive for neck pain.   Musculoskeletal: Negative for joint pain.       Chronic neck pain  Neurological: Positive for focal weakness. Negative for seizures.    Objective: Vital Signs: Blood pressure 165/93, pulse 82, temperature 99 F (37.2 C), temperature source Oral, resp. rate 19, height 5\' 7"  (1.702 m), weight 77.61 kg (171 lb 1.6 oz), SpO2 100.00%. No results found. Results for orders placed during the hospital encounter of 02/22/13 (from the past 72 hour(s))  ALBUMIN     Status: Abnormal   Collection Time    03/08/13  1:45 PM      Result Value Range   Albumin 3.0 (*) 3.5 - 5.2 g/dL      Vitals reviewed.  Constitutional: He is oriented to person, place, and time. He appears well-developed.  Mood/affect flat HENT:  Head: Normocephalic.  Eyes: EOM are normal.  Neck: Neck supple. No thyromegaly present.  Cardiovascular: Normal rate and regular rhythm.   Pulmonary/Chest: Effort normal and breath sounds normal. No respiratory distress.  Abdominal: Soft. Bowel sounds are normal. He exhibits no distension.  Musculoskeletal: He exhibits no edema. No calf or thigh swelling or tenderness, neg Homan's Neurological: He is alert and oriented to person, place, and time.  Follows three step commands  Motor strength is 5/5 in the right deltoid, biceps, triceps, grip, hip flexor, knee extensors, ankle dorsiflexor and plantar flexor  Motor strength is 3 minus/5 in the left deltoid, biceps, triceps, grip, hip flexor, 4 minus in the knee extensor, 3 minus in ankle dorsiflexor and plantar flexor  Sensation is intact to light touch  There is evidence of tactile extinction on double simultaneous stimulation  There is evidence of left-sided neglect on visual confrontation testing  There is a right gaze preference  Verbal output is sparse but no evidence of dysarthria  Patient does have decreased sensation to light touch in the left. Midline chest incision well healed    Assessment/Plan: 1. Functional deficits secondary to R MCA distribution infarct which require 3+ hours per day of interdisciplinary therapy in a comprehensive inpatient rehab setting. Physiatrist is providing close team supervision and 24 hour management of active medical problems listed below. Physiatrist and rehab team continue to assess barriers to discharge/monitor patient progress toward functional and medical goals. FIM: FIM - Bathing Bathing Steps Patient Completed: Chest;Right Arm;Left Arm;Abdomen;Front perineal area;Right upper leg;Left upper leg Bathing: 3: Mod-Patient completes 5-7 62f 10 parts or  50-74%  FIM - Upper Body Dressing/Undressing Upper body dressing/undressing steps patient completed: Thread/unthread right sleeve of pullover shirt/dresss;Thread/unthread left sleeve of pullover shirt/dress;Put head through opening of pull over shirt/dress Upper body dressing/undressing:  4: Min-Patient completed 75 plus % of tasks FIM - Lower Body Dressing/Undressing Lower body dressing/undressing steps patient completed: Thread/unthread right pants leg;Thread/unthread left pants leg Lower body dressing/undressing: 2: Max-Patient completed 25-49% of tasks  FIM - Hotel manager Devices: Grab bar or rail for support Toileting: 1: Total-Patient completed zero steps, helper did all 3  FIM - Diplomatic Services operational officer Devices: Grab bars Toilet Transfers: 3-To toilet/BSC: Mod A (lift or lower assist)  FIM - Banker Devices: Bed rails Bed/Chair Transfer: 3: Supine > Sit: Mod A (lifting assist/Pt. 50-74%/lift 2 legs;1: Sit > Supine: Total A (helper does all/Pt. < 25%)  FIM - Locomotion: Wheelchair Distance: 150 Locomotion: Wheelchair: 0: Activity did not occur FIM - Locomotion: Ambulation Locomotion: Ambulation Assistive Devices: Designer, industrial/product Ambulation/Gait Assistance: 2: Max assist Locomotion: Ambulation: 0: Activity did not occur  Comprehension Comprehension Mode: Asleep Comprehension: 4-Understands basic 75 - 89% of the time/requires cueing 10 - 24% of the time  Expression Expression Mode: Asleep Expression: 4-Expresses basic 75 - 89% of the time/requires cueing 10 - 24% of the time. Needs helper to occlude trach/needs to repeat words.  Social Interaction Social Interaction Mode: Asleep Social Interaction: 4-Interacts appropriately 75 - 89% of the time - Needs redirection for appropriate language or to initiate interaction.  Problem Solving Problem Solving Mode: Asleep Problem Solving: 3-Solves basic 50 - 74% of the time/requires cueing 25 - 49% of the time  Memory Memory Mode: Asleep Memory: 4-Recognizes or recalls 75 - 89% of the time/requires cueing 10 - 24% of the time Medical Problem List and Plan:  1. Recent CVA with left-sided weakness. Will attempt to obtain workup while at  [facility]  2. DVT Prophylaxis/Anticoagulation: SCDs.Lovenox BID 3. Mood: Zoloft 25, takes 50-75% meals on average although 100% 3/9 supper 4. Neuropsych: This patient Is capable of making decisions on his/her own behalf. Will assess for depression 5. Pain management. Vicodin as needed for headaches  6. Seizure disorder. Dilantin 100 mg 3 times daily. low Dilantin level increase to 200mg  BID monitor toxicity   7. Hypothyroidism. Synthroid 125 mcg daily. We'll check old records for thyroid studies  8. BPH history of prostate cancer. Flomax/Casodex. Check PVRs x3  9. Cervical spinal stenosis without evidence of radiculopathy  10. Bilateral knee pain osteoarthritis , continue tramadol for pain 11.  Left calf and thigh pain will check dopplers,Short segment post tibial DVT,exam stable, no need for anti coag will monitor with serial doppler increase lovenox to 30mg  BID and recheck Duplex Friday 3/14    12.  Hyponatremia, d/c IV .9 NS cont FR, recheck BMET stable 128.  Monitor 13  UTI start Nitrofurantoin  LOS (Days) 16 A FACE TO FACE EVALUATION WAS PERFORMED  Rogelia Boga 03/10/2013, 8:31 AM

## 2013-03-10 NOTE — Progress Notes (Signed)
Occupational Therapy Note  Patient Details  Name: [name] MRN: [id number] Date of Birth: [birth year] Today's Date: 03/10/2013 Date:  1400-1455  (55 min) Individual session Pain:  Right posterior pelvis  2/10   Neuromuscular Facilitation: Addressed forward weight shifting and shifting to right pelvis, transfers from wc to mat and back with max assist (2nd person for safety),  Left Upper Extremity;Forced use; Activity to increase sustained activation;Activity to increase lateral weight shifting;Activity to increase anterior-posterior weight shifting seated EOM with reaching with RUE in various directions out of BOS for to facilitate trunk and postural control and anterior weight shift during anterior and lateral leans; practiced reaching out to increase weight forwards and to the R with RUE.   Pt. very reluctant to shift to right hip during sitting . Addressed long leg sitting to stretch hips and low back.   Addressed toilet transfer with total assist +2.  Second person needed for safety.  Pt was mod assist using grab bars and able to stand and pivot with mod assist.       Humberto Seals 03/10/2013, 2:11 PM

## 2013-03-11 ENCOUNTER — Inpatient Hospital Stay (HOSPITAL_COMMUNITY): Payer: Medicare Other | Admitting: *Deleted

## 2013-03-11 LAB — CBC
MCHC: 33.8 g/dL (ref 30.0–36.0)
Platelets: 333 10*3/uL (ref 150–400)
RDW: 15.5 % (ref 11.5–15.5)
WBC: 5.9 10*3/uL (ref 4.0–10.5)

## 2013-03-11 NOTE — Progress Notes (Signed)
Physical Therapy Session Note  Patient Details  Name: [name] MRN: [id number] Date of Birth: 21-Mar-1931  Today's Date: 03/11/2013 Time: 0800-0900 (60 min)  Patient supine in bed, complaining of neck pain 2/10 due to positioning. Rolling in bed with use of L rail with min A, cues for sequencing and advancement of LE. Scooting to EOB with min A.Supine to sit with HOB elevated and with mod A. EOB sitting to finish breakfast,patient needs cues to maintain sitting position w/o L side lean.After less than 3 min patient wanted to lay back down ,sit to supine with mod A. In supine rolling R and L to don pants. SLR 2x12 , hip felxion 2x 12 and abd 2x12. Bridging 2 x10. Transfer from bed to w/c max A,patient is not helping much during transfer,poor motor control on L side. W/c mobility training-patient requires mod A due to inability to navigate through the doors, reduced propulsion on the L and L side inattention.Mobility trained on a distance 2 x 20 feet. In Parallel bars training in sit to stand with min A,patient pulls on bars, cues for forward weight shift and proper posture.  Gait training in bars 3x with turns R and L with mod A. Patient neede verbal and tactile cues for L foot positioning and weight acceptance. Stand to sit with mod A due to decreased descant control. Transfer w/c to bed to supine with max A.     Therapy Documentation Precautions:  Precautions Precautions: Fall Precaution Comments: L inatention Restrictions Weight Bearing Restrictions: No Vital Signs: Therapy Vitals Temp: 97.5 F (36.4 C) Temp src: Oral Pulse Rate: 86 Resp: 18 BP: 158/89 mmHg Oxygen Therapy SpO2: 96 % O2 Device: None (Room air) Pain: Pain Assessment Pain Assessment: 0-10 Pain Score:   2 Pain Location: Neck Pain Orientation: Posterior Pain Descriptors: Aching Pain Intervention(s): Repositioned  See FIM for current functional status  Therapy/Group: Individual Therapy  Dorna Mai 03/11/2013, 8:57 AM

## 2013-03-11 NOTE — Progress Notes (Signed)
Patient ID: [name], male   DOB: 1931-01-05, 79 y.o.   MRN: [id number] Patient ID: [name], male   DOB: [birth year], 79 y.o.   MRN: [id number] Patient ID: [name], male   DOB: 1931-07-13, 79 y.o.   MRN: [id number]  Subjective/Complaints:   91/60.  79 year-old male with history of CAD, CABG who was was admitted for evaluation and treatment of an acute stroke. Medical problems include hypertension next dyslipidemia. Remote history of prostate cancer.  Lower extremity venous Doppler study  3/14  revealed a new thrombus in the left common femoral vein; patient's full heparinization BP Readings from Last 3 Encounters:  03/11/13 158/89  11/15/12 145/98  05/03/12 132/78    Gen- no distress ENT- unremarkable Chest-clear CV- no tachy Abd- benign; no distension Extr- no signif swelling or calf tenderness  L DVT-  Continue Full heparinization S/p CVA HTN- remains  slightly elevated today- will continue to observe and consider treatment if persistent elevations   No CP or SOB, no left leg pain Reviewed neuropsych note, no signs of depression, has been on Paxil since [facility] stay reduced yesterday No seizures Low DPH + nitrates on UA, culture + Staph coag neg Review of Systems  HENT: Positive for neck pain.   Musculoskeletal: Negative for joint pain.       Chronic neck pain  Neurological: Positive for focal weakness. Negative for seizures.    Objective: Vital Signs: Blood pressure 158/89, pulse 86, temperature 97.5 F (36.4 C), temperature source Oral, resp. rate 18, height 5\' 7"  (1.702 m), weight 77.61 kg (171 lb 1.6 oz), SpO2 96.00%. No results found. Results for orders placed during the hospital encounter of 02/22/13 (from the past 72 hour(s))  ALBUMIN     Status: Abnormal   Collection Time    03/08/13  1:45 PM      Result Value Range   Albumin 3.0 (*) 3.5 - 5.2 g/dL      Vitals reviewed.  Constitutional: He is oriented to person, place, and time. He appears  well-developed.  Mood/affect flat HENT:  Head: Normocephalic.  Eyes: EOM are normal.  Neck: Neck supple. No thyromegaly present.  Cardiovascular: Normal rate and regular rhythm.  Pulmonary/Chest: Effort normal and breath sounds normal. No respiratory distress.  Abdominal: Soft. Bowel sounds are normal. He exhibits no distension.  Musculoskeletal: He exhibits no edema. No calf or thigh swelling or tenderness, neg Homan's Neurological: He is alert and oriented to person, place, and time.  Follows three step commands  Motor strength is 5/5 in the right deltoid, biceps, triceps, grip, hip flexor, knee extensors, ankle dorsiflexor and plantar flexor  Motor strength is 3 minus/5 in the left deltoid, biceps, triceps, grip, hip flexor, 4 minus in the knee extensor, 3 minus in ankle dorsiflexor and plantar flexor  Sensation is intact to light touch  There is evidence of tactile extinction on double simultaneous stimulation  There is evidence of left-sided neglect on visual confrontation testing  There is a right gaze preference  Verbal output is sparse but no evidence of dysarthria  Patient does have decreased sensation to light touch in the left. Midline chest incision well healed    Assessment/Plan: 1. Functional deficits secondary to R MCA distribution infarct which require 3+ hours per day of interdisciplinary therapy in a comprehensive inpatient rehab setting. Physiatrist is providing close team supervision and 24 hour management of active medical problems listed below. Physiatrist and rehab team continue to assess barriers to discharge/monitor patient progress toward  functional and medical goals. FIM: FIM - Bathing Bathing Steps Patient Completed: Chest;Right Arm;Left Arm;Abdomen;Front perineal area;Right upper leg;Left upper leg Bathing: 3: Mod-Patient completes 5-7 5f 10 parts or 50-74%  FIM - Upper Body Dressing/Undressing Upper body dressing/undressing steps patient completed:  Thread/unthread right sleeve of pullover shirt/dresss;Thread/unthread left sleeve of pullover shirt/dress;Put head through opening of pull over shirt/dress Upper body dressing/undressing: 4: Min-Patient completed 75 plus % of tasks FIM - Lower Body Dressing/Undressing Lower body dressing/undressing steps patient completed: Thread/unthread right pants leg;Thread/unthread left pants leg Lower body dressing/undressing: 2: Max-Patient completed 25-49% of tasks  FIM - Hotel manager Devices: Grab bar or rail for support Toileting: 1: Total-Patient completed zero steps, helper did all 3  FIM - Diplomatic Services operational officer Devices: Grab bars Toilet Transfers: 3-To toilet/BSC: Mod A (lift or lower assist)  FIM - Banker Devices: Bed rails Bed/Chair Transfer: 3: Supine > Sit: Mod A (lifting assist/Pt. 50-74%/lift 2 legs;1: Sit > Supine: Total A (helper does all/Pt. < 25%)  FIM - Locomotion: Wheelchair Distance: 150 Locomotion: Wheelchair: 0: Activity did not occur FIM - Locomotion: Ambulation Locomotion: Ambulation Assistive Devices: Designer, industrial/product Ambulation/Gait Assistance: 2: Max assist Locomotion: Ambulation: 0: Activity did not occur  Comprehension Comprehension Mode: Auditory Comprehension: 3-Understands basic 50 - 74% of the time/requires cueing 25 - 50%  of the time  Expression Expression Mode: Verbal Expression: 4-Expresses basic 75 - 89% of the time/requires cueing 10 - 24% of the time. Needs helper to occlude trach/needs to repeat words.  Social Interaction Social Interaction Mode: Asleep Social Interaction: 4-Interacts appropriately 75 - 89% of the time - Needs redirection for appropriate language or to initiate interaction.  Problem Solving Problem Solving Mode: Asleep Problem Solving: 3-Solves basic 50 - 74% of the time/requires cueing 25 - 49% of the time  Memory Memory Mode: Asleep Memory:  3-Recognizes or recalls 50 - 74% of the time/requires cueing 25 - 49% of the time Medical Problem List and Plan:  1. Recent CVA with left-sided weakness. Will attempt to obtain workup while at [facility]  2. DVT Prophylaxis/Anticoagulation: SCDs.Lovenox BID 3. Mood: Zoloft 25, takes 50-75% meals on average although 100% 3/9 supper 4. Neuropsych: This patient Is capable of making decisions on his/her own behalf. Will assess for depression 5. Pain management. Vicodin as needed for headaches  6. Seizure disorder. Dilantin 100 mg 3 times daily. low Dilantin level increase to 200mg  BID monitor toxicity   7. Hypothyroidism. Synthroid 125 mcg daily. We'll check old records for thyroid studies  8. BPH history of prostate cancer. Flomax/Casodex. Check PVRs x3  9. Cervical spinal stenosis without evidence of radiculopathy  10. Bilateral knee pain osteoarthritis , continue tramadol for pain 11.  Left calf and thigh pain will check dopplers,Short segment post tibial DVT,exam stable, no need for anti coag will monitor with serial doppler increase lovenox to 30mg  BID and recheck Duplex Friday 3/14    12.  Hyponatremia, d/c IV .9 NS cont FR, recheck BMET stable 128.  Monitor 13  UTI start Nitrofurantoin  LOS (Days) 17 A FACE TO FACE EVALUATION WAS PERFORMED  Rogelia Boga 03/11/2013, 8:03 AM

## 2013-03-12 ENCOUNTER — Inpatient Hospital Stay (HOSPITAL_COMMUNITY): Payer: Medicare Other | Admitting: Occupational Therapy

## 2013-03-12 ENCOUNTER — Inpatient Hospital Stay (HOSPITAL_COMMUNITY): Payer: Medicare Other | Admitting: Physical Therapy

## 2013-03-12 ENCOUNTER — Inpatient Hospital Stay (HOSPITAL_COMMUNITY): Payer: Medicare Other | Admitting: Speech Pathology

## 2013-03-12 DIAGNOSIS — G8110 Spastic hemiplegia affecting unspecified side: Secondary | ICD-10-CM

## 2013-03-12 DIAGNOSIS — I824Y9 Acute embolism and thrombosis of unspecified deep veins of unspecified proximal lower extremity: Secondary | ICD-10-CM | POA: Diagnosis not present

## 2013-03-12 DIAGNOSIS — I6330 Cerebral infarction due to thrombosis of unspecified cerebral artery: Secondary | ICD-10-CM

## 2013-03-12 LAB — BASIC METABOLIC PANEL
BUN: 10 mg/dL (ref 6–23)
Chloride: 93 mEq/L — ABNORMAL LOW (ref 96–112)
GFR calc Af Amer: >90 mL/min (ref >90)
GFR calc non Af Amer: >90 mL/min (ref >90)
Potassium: 3.4 mEq/L — ABNORMAL LOW (ref 3.5–5.1)
Sodium: 129 mEq/L — ABNORMAL LOW (ref 135–145)

## 2013-03-12 MED ORDER — RIVAROXABAN 15 MG PO TABS
15.0000 mg | ORAL_TABLET | Freq: Two times a day (BID) | ORAL | Status: DC
  Administered 2013-03-12 – 2013-03-26 (×29): 15 mg via ORAL
  Filled 2013-03-12 (×32): qty 1

## 2013-03-12 NOTE — Progress Notes (Signed)
Speech Language Pathology Daily Session Note  Patient Details  Name: [name] MRN: [id number] Date of Birth: 03/23/1931  Today's Date: 03/12/2013 Time: 4098-1191 Time Calculation (min): 45 min  Short Term Goals: Week 3: SLP Short Term Goal 1 (Week 3): Patient will attend to and utilize left upper extremity during functional tasks with mod assist question cues. SLP Short Term Goal 2 (Week 3): Patient will attent to left environment during functional tasks with mod assist verbal cues. SLP Short Term Goal 3 (Week 3): Patient will solve moderately complex problems with min assist verbal cues to self monitor and correct.   SLP Short Term Goal 4 (Week 3): Patient will select attention to propelling wheelchair to speech room with mod assist vebral cues for redirection.   Skilled Therapeutic Interventions: Skilled treatment session focused on addressing cognitive-linguistic and self-monitoring goals.  Patient required increased wait time to scan through and sequence 4 pictures cards; SLP facilitate session with min-mod assist cues to self-monitor and correct sequence accuracy; Patient also required max verbal, question and visual to attend to left upper extremity during functional mail opening task.    FIM:  Comprehension Comprehension Mode: Auditory Comprehension: 4-Understands basic 75 - 89% of the time/requires cueing 10 - 24% of the time Expression Expression Mode: Verbal Expression: 5-Expresses basic 90% of the time/requires cueing < 10% of the time. Social Interaction Social Interaction: 4-Interacts appropriately 75 - 89% of the time - Needs redirection for appropriate language or to initiate interaction. Problem Solving Problem Solving: 2-Solves basic 25 - 49% of the time - needs direction more than half the time to initiate, plan or complete simple activities Memory Memory: 3-Recognizes or recalls 50 - 74% of the time/requires cueing 25 - 49% of the time FIM - Eating Eating  Activity: 5: Set-up assist for open containers  Pain Pain Assessment Pain Assessment: No/denies pain  Therapy/Group: Individual Therapy  Charlane Ferretti., CCC-SLP [phone]  [name] 03/12/2013, 12:22 PM

## 2013-03-12 NOTE — Progress Notes (Signed)
Social Work Patient ID: [name], male   DOB: Jun 19, 1931, 79 y.o.   MRN: [id number] Contacted daughter to discuss if she is coming in Wed to begin family education.  She will be here at 10:00 to begin.  She was concerned due to pt down today-feels Not making any progress.  Will talk with him about this.  His progress is very slow and his fear of falling limits him in therapy.  Will need to see if daughter able to provide The care pt will require, he insists he will be hiring someone.  Will need to begin this process prior to his discharge from here.

## 2013-03-12 NOTE — Progress Notes (Signed)
Occupational Therapy Session Note  Patient Details  Name: [name] MRN: [id number] Date of Birth: 01-08-1931  Today's Date: 03/12/2013 Time: 0930-1030 and 1130-1155 Time Calculation (min): 60 min and 25 min  Short Term Goals: Week 3:  OT Short Term Goal 1 (Week 3): Pt will complete bathing with min assist at sit <> stand level OT Short Term Goal 2 (Week 3): Pt will complete UB dressing with supervision OT Short Term Goal 3 (Week 3): Pt. will dress LB with moderate assist OT Short Term Goal 4 (Week 3): Pt will complete toilet transfer with min assist with use of grab bar OT Short Term Goal 5 (Week 3): Pt will complete shower transfer with min assist  Skilled Therapeutic Interventions/Progress Updates:    1) Pt seen for ADL retraining with focus on transfers, sit <> stand, initiation, sequencing, and attention to Lt side.  Pt in bed upon arrival, required max verbal cues and manual facilitation for squat pivot transfer to w/c.  Pt with decreased weight shifting and hesitancy with transfer, resulting in pushing away from w/c.  This therapist used Bobath technique (over the back) to increased forward weight shifting to assist in transfer.  Bathing completed at sit <> stand level at sink with focus on initiation and functional use of LUE in bathing.  Lt hand gross assist with grooming tasks.    2) Pt seen for 1:1 OT session with focus on cognitive skills development and attention to Lt.  Carryover of clock task initiated with SLP with pt demonstrating Lt inattention and decreased visual perceptual skills.  Pt required mod orientation cues with focus on setting up task to promote success, however pt still with difficulty completing task.  Pt reports needing to void, when encouraged to perform toilet transfer pt adamantly refused and insisted on returning to bed to use urinal. Pt set up with urinal for increased positioning.  Therapy Documentation Precautions:  Precautions Precautions:  Fall Precaution Comments: L inatention Restrictions Weight Bearing Restrictions: No Pain: Pain Assessment Pain Assessment: No/denies pain Pain Score: 0-No pain  See FIM for current functional status  Therapy/Group: Individual Therapy  [name] 03/12/2013, 10:33 AM

## 2013-03-12 NOTE — Progress Notes (Signed)
Physical Therapy Session Note  Patient Details  Name: [name] MRN: [id number] Date of Birth: Dec 18, 1931  Today's Date: 03/12/2013 Time: 7829-5621 Time Calculation (min): 57 min  Short Term Goals: Week 1:  PT Short Term Goal 1 (Week 1): No goals set for week 1 Week 2:  PT Short Term Goal 1 (Week 2): Patient will perform bed mobility to L and R with mod A on flat bed PT Short Term Goal 1 - Progress (Week 2): Progressing toward goal PT Short Term Goal 2 (Week 2): Patient will perform bed <> w/c transfers stand or squat pivot to L and R mod A consistently PT Short Term Goal 2 - Progress (Week 2): Progressing toward goal PT Short Term Goal 3 (Week 2): Patient will performed w/c mobility in controlled environment x 150' wtih supervision and intermittent verbal cues to attend to L environment PT Short Term Goal 3 - Progress (Week 2): Progressing toward goal PT Short Term Goal 4 (Week 2): Patient will perform gait in controlled environment x 15' with LRAD and max A  PT Short Term Goal 4 - Progress (Week 2): Met Week 3:  PT Short Term Goal 1 (Week 3): Patient will perform bed mobility to L and R with mod A on flat bed PT Short Term Goal 2 (Week 3): Patient will perform bed <> w/c transfers stand or squat pivot to L and R mod A consistently PT Short Term Goal 3 (Week 3): Patient will performed w/c mobility in controlled environment x 150' wtih supervision and intermittent verbal cues to attend to L environment PT Short Term Goal 4 (Week 3): Patient will ambulate in controlled environment x 25' with RW and mod A  PT Short Term Goal 5 (Week 3): Patient will perform up and down 4 stairs with 2 rails and mod A  Therapy Documentation Precautions:  Precautions Precautions: Fall Precaution Comments: L inatention Restrictions Weight Bearing Restrictions: No Vital Signs: Therapy Vitals Temp: 97.3 F (36.3 C) Temp src: Oral Pulse Rate: 100 Resp: 18 BP: 112/75 mmHg Patient Position, if  appropriate: Sitting Oxygen Therapy SpO2: 100 % O2 Device: None (Room air) Pain: Pain Assessment Pain Assessment: No/denies pain Mobility:  Patient supine in bed attempting to use urinal.  Nursing tech assisted patient with removal of urinal.  Patient performed rolling to R and supine > sit with min-mod A with bed rail with verbal cues for initiation and sequencing; performed sit > stand from bed x 2 with mod A with verbal cues for hand placement, trunk elongation and full anterior lean to translate COG over BOS during sit > stand and maintain static standing with mod-max A to change into clean brief and pull up pants; patient continues to present with significant posterior lean/LOB and unable to maintain static standing without max A to maintain COG over BOS.  Patient set up in [place] EOB and performed sit > stand and static standing in [place] with use of harness to assist with full hip and trunk extension and to bring COG over BOS; also performed dynamic reaching with RUE forwards and to the R for R lateral and anterior weight shifting and LLE extension.    Locomotion : Ambulation Ambulation/Gait Assistance: 1: +2 Total assist with use of SARA plus x 40' + 40' in controlled environment with one person guiding SARA and second therapist providing verbal cues for full LLE clearance and step length and manual facilitation for R lateral and anterior weight shifting.  One sitting rest break.  Patient demonstrated improved step length, endurance and confidence in SARA Plus.    See FIM for current functional status  Therapy/Group: Individual Therapy  [name] 03/12/2013, 3:45 PM

## 2013-03-12 NOTE — Progress Notes (Signed)
Patient ID: [name], male   DOB: 21-Jan-1931, 79 y.o.   MRN: [id number]  Subjective/Complaints: 79 year-old male with history of CAD, CABG who was recently evaluated in the emergency room 2 weeks ago at [facility] with weakness of his left arm and left leg noted to have acute infarct. By report he was subsequently transferred to [facility] after evaluation Plavix was added to aspirin. He did not receive TPA per report. Patient noted to have seizure and was loaded with intravenous Dilantin. Paperwork was very minimal due to workup being done at [facility]. He was later transferred back to [facility] for ongoing physical therapy and left-sided weakness secondary to stroke.  The patient has knee pain and takes tramadol at home for this.  The patient has a history of neck pain but not currently complaining. MRI in 2008 demonstrating multilevel cervical stenosis C3-C7 levels    No CP or SOB, no left leg pain Reviewed neuropsych note, no signs of depression, has been on Paxil since [facility] stay reduced yesterday No seizures Low DPH + nitrates on UA, culture + Staph coag neg Review of Systems  HENT: Positive for neck pain.   Musculoskeletal: Negative for joint pain.       Chronic neck pain  Neurological: Positive for focal weakness. Negative for seizures.    Objective: Vital Signs: Blood pressure 133/75, pulse 73, temperature 98.1 F (36.7 C), temperature source Oral, resp. rate 16, height 5\' 7"  (1.702 m), weight 77.61 kg (171 lb 1.6 oz), SpO2 97.00%. No results found. Results for orders placed during the hospital encounter of 02/22/13 (from the past 72 hour(s))  CBC     Status: Abnormal   Collection Time    03/11/13  7:46 AM      Result Value Range   WBC 5.9  4.0 - 10.5 K/uL   RBC 3.67 (*) 4.22 - 5.81 MIL/uL   Hemoglobin 10.6 (*) 13.0 - 17.0 g/dL   HCT 40.9 (*) 81.1 - 91.4 %   MCV 85.6  78.0 - 100.0 fL   MCH 28.9  26.0 - 34.0 pg   MCHC 33.8  30.0 -  36.0 g/dL   RDW 78.2  95.6 - 21.3 %   Platelets 333  150 - 400 K/uL      Vitals reviewed.  Constitutional: He is oriented to person, place, and time. He appears well-developed.  Mood/affect flat HENT:  Head: Normocephalic.  Eyes: EOM are normal.  Neck: Neck supple. No thyromegaly present.  Cardiovascular: Normal rate and regular rhythm.  Pulmonary/Chest: Effort normal and breath sounds normal. No respiratory distress.  Abdominal: Soft. Bowel sounds are normal. He exhibits no distension.  Musculoskeletal: He exhibits no edema. No calf or thigh swelling or tenderness, neg Homan's Neurological: He is alert and oriented to person, place, and time.  Follows three step commands  Motor strength is 5/5 in the right deltoid, biceps, triceps, grip, hip flexor, knee extensors, ankle dorsiflexor and plantar flexor  Motor strength is 3 minus/5 in the left deltoid, biceps, triceps, grip, hip flexor, 4 minus in the knee extensor, 3 minus in ankle dorsiflexor and plantar flexor  Sensation is intact to light touch  There is evidence of tactile extinction on double simultaneous stimulation  There is evidence of left-sided neglect on visual confrontation testing  There is a right gaze preference  Verbal output is sparse but no evidence of dysarthria  Patient does have decreased sensation to light touch in the left. Midline chest incision  well healed    Assessment/Plan: 1. Functional deficits secondary to R MCA distribution infarct which require 3+ hours per day of interdisciplinary therapy in a comprehensive inpatient rehab setting. Physiatrist is providing close team supervision and 24 hour management of active medical problems listed below. Physiatrist and rehab team continue to assess barriers to discharge/monitor patient progress toward functional and medical goals. FIM: FIM - Bathing Bathing Steps Patient Completed: Chest;Right Arm;Left Arm;Abdomen;Front perineal area;Right upper leg;Left upper  leg Bathing: 3: Mod-Patient completes 5-7 19f 10 parts or 50-74%  FIM - Upper Body Dressing/Undressing Upper body dressing/undressing steps patient completed: Thread/unthread right sleeve of pullover shirt/dresss;Thread/unthread left sleeve of pullover shirt/dress;Put head through opening of pull over shirt/dress Upper body dressing/undressing: 4: Min-Patient completed 75 plus % of tasks FIM - Lower Body Dressing/Undressing Lower body dressing/undressing steps patient completed: Thread/unthread right pants leg;Thread/unthread left pants leg Lower body dressing/undressing: 2: Max-Patient completed 25-49% of tasks  FIM - Hotel manager Devices: Grab bar or rail for support Toileting: 1: Total-Patient completed zero steps, helper did all 3  FIM - Diplomatic Services operational officer Devices: Therapist, music Transfers: 3-To toilet/BSC: Mod A (lift or lower assist)  FIM - Banker Devices: HOB elevated Bed/Chair Transfer: 1: Two helpers  FIM - Locomotion: Wheelchair Distance: 150 Locomotion: Wheelchair: 1: Travels less than 50 ft with moderate assistance (Pt: 50 - 74%) FIM - Locomotion: Ambulation Locomotion: Ambulation Assistive Devices: Parallel bars Ambulation/Gait Assistance: 3: Mod assist Locomotion: Ambulation: 1: Travels less than 50 ft with moderate assistance (Pt: 50 - 74%)  Comprehension Comprehension Mode: Auditory Comprehension: 3-Understands basic 50 - 74% of the time/requires cueing 25 - 50%  of the time  Expression Expression Mode: Verbal Expression: 4-Expresses basic 75 - 89% of the time/requires cueing 10 - 24% of the time. Needs helper to occlude trach/needs to repeat words.  Social Interaction Social Interaction Mode: Asleep Social Interaction: 4-Interacts appropriately 75 - 89% of the time - Needs redirection for appropriate language or to initiate interaction.  Problem Solving Problem Solving Mode:  Asleep Problem Solving: 3-Solves basic 50 - 74% of the time/requires cueing 25 - 49% of the time  Memory Memory Mode: Asleep Memory: 3-Recognizes or recalls 50 - 74% of the time/requires cueing 25 - 49% of the time Medical Problem List and Plan:  1. Recent CVA with left-sided weakness. Will attempt to obtain workup while at [facility]  2. DVT Prophylaxis/Anticoagulation: SCDs.Lovenox BID 3. Mood: Zoloft 25, takes 50-75% meals on average although 100% 3/9 supper 4. Neuropsych: This patient Is capable of making decisions on his/her own behalf. Will assess for depression 5. Pain management. Vicodin as needed for headaches  6. Seizure disorder. Dilantin 100 mg 3 times daily. low Dilantin level increase to 200mg  BID monitor toxicity   7. Hypothyroidism. Synthroid 125 mcg daily. We'll check old records for thyroid studies  8. BPH history of prostate cancer. Flomax/Casodex. Check PVRs x3  9. Cervical spinal stenosis without evidence of radiculopathy  10. Bilateral knee pain osteoarthritis , continue tramadol for pain 11.  Left calf and thigh pain will check dopplers,Short segment post tibial DVT,exam stable,but new Left common femoral DVT, on full dose lovenox but will switch to xarelto 3/17, stop Plavix     12.  Hyponatremia, d/c IV .9 NS cont FR, recheck BMET .  Monitor 13  UTI start Nitrofurantoin  LOS (Days) 18 A FACE TO FACE EVALUATION WAS PERFORMED  KIRSTEINS,ANDREW E 03/12/2013, 7:19 AM

## 2013-03-13 ENCOUNTER — Inpatient Hospital Stay (HOSPITAL_COMMUNITY): Payer: Medicare Other | Admitting: Physical Therapy

## 2013-03-13 ENCOUNTER — Inpatient Hospital Stay (HOSPITAL_COMMUNITY): Payer: Medicare Other | Admitting: Occupational Therapy

## 2013-03-13 ENCOUNTER — Inpatient Hospital Stay (HOSPITAL_COMMUNITY): Payer: Medicare Other | Admitting: Speech Pathology

## 2013-03-13 DIAGNOSIS — I6330 Cerebral infarction due to thrombosis of unspecified cerebral artery: Secondary | ICD-10-CM

## 2013-03-13 DIAGNOSIS — G8110 Spastic hemiplegia affecting unspecified side: Secondary | ICD-10-CM

## 2013-03-13 MED ORDER — LEVETIRACETAM 500 MG PO TABS
500.0000 mg | ORAL_TABLET | Freq: Two times a day (BID) | ORAL | Status: DC
  Administered 2013-03-13 – 2013-03-26 (×27): 500 mg via ORAL
  Filled 2013-03-13 (×29): qty 1

## 2013-03-13 NOTE — Progress Notes (Signed)
Patient ID: [name], male   DOB: 23-Apr-1931, 79 y.o.   MRN: [id number]  Subjective/Complaints: 79 year-old male with history of CAD, CABG who was recently evaluated in the emergency room 2 weeks ago at [facility] with weakness of his left arm and left leg noted to have acute infarct. By report he was subsequently transferred to [facility] after evaluation Plavix was added to aspirin. He did not receive TPA per report. Patient noted to have seizure and was loaded with intravenous Dilantin. Paperwork was very minimal due to workup being done at [facility]. He was later transferred back to [facility] for ongoing physical therapy and left-sided weakness secondary to stroke.  The patient has knee pain and takes tramadol at home for this.  The patient has a history of neck pain but not currently complaining. MRI in 2008 demonstrating multilevel cervical stenosis C3-C7 levels    No CP or SOB, no left leg pain Reviewed neuropsych note, no signs of depression, has been on Paxil since [facility] stay reduced yesterday No seizures Low DPH + nitrates on UA, culture + Staph coag neg Review of Systems  HENT: Positive for neck pain.   Musculoskeletal: Negative for joint pain.       Chronic neck pain  Neurological: Positive for focal weakness. Negative for seizures.    Objective: Vital Signs: Blood pressure 145/91, pulse 83, temperature 98.1 F (36.7 C), temperature source Oral, resp. rate 18, height 5\' 7"  (1.702 m), weight 77.61 kg (171 lb 1.6 oz), SpO2 96.00%. No results found. Results for orders placed during the hospital encounter of 02/22/13 (from the past 72 hour(s))  CBC     Status: Abnormal   Collection Time    03/11/13  7:46 AM      Result Value Range   WBC 5.9  4.0 - 10.5 K/uL   RBC 3.67 (*) 4.22 - 5.81 MIL/uL   Hemoglobin 10.6 (*) 13.0 - 17.0 g/dL   HCT 40.9 (*) 81.1 - 91.4 %   MCV 85.6  78.0 - 100.0 fL   MCH 28.9  26.0 - 34.0 pg   MCHC 33.8  30.0 -  36.0 g/dL   RDW 78.2  95.6 - 21.3 %   Platelets 333  150 - 400 K/uL  BASIC METABOLIC PANEL     Status: Abnormal   Collection Time    03/12/13  8:16 AM      Result Value Range   Sodium 129 (*) 135 - 145 mEq/L   Potassium 3.4 (*) 3.5 - 5.1 mEq/L   Chloride 93 (*) 96 - 112 mEq/L   CO2 23  19 - 32 mEq/L   Glucose, Bld 117 (*) 70 - 99 mg/dL   BUN 10  6 - 23 mg/dL   Creatinine, Ser 0.86  0.50 - 1.35 mg/dL   Calcium 9.0  8.4 - 57.8 mg/dL   GFR calc non Af Amer >90  >90 mL/min   GFR calc Af Amer >90  >90 mL/min   Comment:            The eGFR has been calculated     using the CKD EPI equation.     This calculation has not been     validated in all clinical     situations.     eGFR's persistently     <90 mL/min signify     possible Chronic Kidney Disease.      Vitals reviewed.  Constitutional: He is oriented to person, place,  and time. He appears well-developed.  Mood/affect flat HENT:  Head: Normocephalic.  Eyes: EOM are normal.  Neck: Neck supple. No thyromegaly present.  Cardiovascular: Normal rate and regular rhythm.  Pulmonary/Chest: Effort normal and breath sounds normal. No respiratory distress.  Abdominal: Soft. Bowel sounds are normal. He exhibits no distension.  Musculoskeletal: He exhibits no edema. No calf or thigh swelling or tenderness, neg Homan's Neurological: He is alert and oriented to person, place, and time.  Follows three step commands  Motor strength is 5/5 in the right deltoid, biceps, triceps, grip, hip flexor, knee extensors, ankle dorsiflexor and plantar flexor  Motor strength is 3 minus/5 in the left deltoid, biceps, triceps, grip, hip flexor, 4 minus in the knee extensor, 3 minus in ankle dorsiflexor and plantar flexor  Sensation is intact to light touch  There is evidence of tactile extinction on double simultaneous stimulation  There is evidence of left-sided neglect on visual confrontation testing  There is a right gaze preference  Verbal output is  sparse but no evidence of dysarthria  Patient does have decreased sensation to light touch in the left. Midline chest incision well healed    Assessment/Plan: 1. Functional deficits secondary to R MCA distribution infarct which require 3+ hours per day of interdisciplinary therapy in a comprehensive inpatient rehab setting. Physiatrist is providing close team supervision and 24 hour management of active medical problems listed below. Physiatrist and rehab team continue to assess barriers to discharge/monitor patient progress toward functional and medical goals. FIM: FIM - Bathing Bathing Steps Patient Completed: Chest;Right Arm;Left Arm;Abdomen;Front perineal area;Right upper leg;Left upper leg Bathing: 3: Mod-Patient completes 5-7 41f 10 parts or 50-74%  FIM - Upper Body Dressing/Undressing Upper body dressing/undressing steps patient completed: Thread/unthread right sleeve of pullover shirt/dresss;Thread/unthread left sleeve of pullover shirt/dress;Put head through opening of pull over shirt/dress Upper body dressing/undressing: 4: Min-Patient completed 75 plus % of tasks FIM - Lower Body Dressing/Undressing Lower body dressing/undressing steps patient completed: Thread/unthread right underwear leg;Thread/unthread left underwear leg;Thread/unthread right pants leg;Thread/unthread left pants leg Lower body dressing/undressing: 2: Max-Patient completed 25-49% of tasks  FIM - Hotel manager Devices: Grab bar or rail for support Toileting: 1: Total-Patient completed zero steps, helper did all 3  FIM - Diplomatic Services operational officer Devices: Grab bars Toilet Transfers: 3-To toilet/BSC: Mod A (lift or lower assist)  FIM - Banker Devices: Bed rails;Arm rests Bed/Chair Transfer: 3: Supine > Sit: Mod A (lifting assist/Pt. 50-74%/lift 2 legs;1: Mechanical lift  FIM - Locomotion: Wheelchair Distance: 150 Locomotion:  Wheelchair: 0: Activity did not occur FIM - Locomotion: Ambulation Locomotion: Ambulation Assistive Devices: Sara Plus Ambulation/Gait Assistance: 1: +2 Total assist Locomotion: Ambulation: 1: Two helpers  Comprehension Comprehension Mode: Auditory Comprehension: 4-Understands basic 75 - 89% of the time/requires cueing 10 - 24% of the time  Expression Expression Mode: Verbal Expression: 5-Expresses basic 90% of the time/requires cueing < 10% of the time.  Social Interaction Social Interaction Mode: Asleep Social Interaction: 4-Interacts appropriately 75 - 89% of the time - Needs redirection for appropriate language or to initiate interaction.  Problem Solving Problem Solving Mode: Asleep Problem Solving: 2-Solves basic 25 - 49% of the time - needs direction more than half the time to initiate, plan or complete simple activities  Memory Memory Mode: Asleep Memory: 3-Recognizes or recalls 50 - 74% of the time/requires cueing 25 - 49% of the time Medical Problem List and Plan:  1. Recent CVA with left-sided weakness. Will  attempt to obtain workup while at [facility]  2. DVT Prophylaxis/Anticoagulation: SCDs.Xarelto which has negative interaction with DPH 3. Mood: Zoloft 25, takes 50-75% meals on average although 100% 3/9 supper 4. Neuropsych: This patient Is capable of making decisions on his/her own behalf. Will assess for depression 5. Pain management. Vicodin as needed for headaches  6. Seizure disorder.Dilantin  200mg  BID ,was seizure free with low DPH level.  Pt states he remembered seizure at [facility] and it affected only LUE.THis would be partial and we can use Keppra which has no Xarelto interaction.  D/W pt agrees with plan.   7. Hypothyroidism. Synthroid 125 mcg daily. We'll check old records for thyroid studies  8. BPH history of prostate cancer. Flomax/Casodex. Check PVRs x3  9. Cervical spinal stenosis without evidence of radiculopathy  10. Bilateral knee pain  osteoarthritis , continue tramadol for pain 11.  Left calf and thigh pain will check dopplers,Short segment post tibial DVT,exam stable,but new Left common femoral DVT, on full dose lovenox but will switch to xarelto 3/17, stop Plavix     12.  Hyponatremia, d/c IV .9 NS cont FR, recheck BMET .  Monitor 13  UTI start Nitrofurantoin  LOS (Days) 19 A FACE TO FACE EVALUATION WAS PERFORMED  Reeanna Acri E 03/13/2013, 7:46 AM

## 2013-03-13 NOTE — Progress Notes (Signed)
Occupational Therapy Session Note  Patient Details  Name: [name] MRN: [id number] Date of Birth: 03/31/1931  Today's Date: 03/13/2013 Time: 0930-1030 Time Calculation (min): 60 min  Short Term Goals: Week 3:  OT Short Term Goal 1 (Week 3): Pt will complete bathing with min assist at sit <> stand level OT Short Term Goal 2 (Week 3): Pt will complete UB dressing with supervision OT Short Term Goal 3 (Week 3): Pt. will dress LB with moderate assist OT Short Term Goal 4 (Week 3): Pt will complete toilet transfer with min assist with use of grab bar OT Short Term Goal 5 (Week 3): Pt will complete shower transfer with min assist  Skilled Therapeutic Interventions/Progress Updates:    Pt seen for ADL retraining with focus on transfers, sit <> stand, initiation, sequencing, attention to task and Lt side, and increased participation in LB dressing.  Pt in bed upon arrival, performed squat pivot transfer to Lt this session with pt with increased participation and less pushing.  Bathing completed at sink at sit <> stand level with focus on initiation and functional use of LUE.  While bathing, pt reports needing to have BM.  Performed stand pivot transfer from w/c to toilet with use of grab bars with mod-min assist.  Pt with increased forward weight shift when able to pull up on grab bar and demonstrates increased confidence with UE support.  LB dressing completed with cues for orientation and technique.  Attempted donning socks with backward chaining, however pt unable to achieve successful grasp with Lt hand and requested assistance to finish donning socks.  Therapy Documentation Precautions:  Precautions Precautions: Fall Precaution Comments: L inatention Restrictions Weight Bearing Restrictions: No Pain: Pain Assessment Pain Assessment: No/denies pain Pain Score: 0-No pain  See FIM for current functional status  Therapy/Group: Individual Therapy  [name] 03/13/2013, 11:45  AM

## 2013-03-13 NOTE — Progress Notes (Signed)
Speech Language Pathology Daily Session Note  Patient Details  Name: [name] MRN: [id number] Date of Birth: 05-20-1931  Today's Date: 03/13/2013 Time: 6578-4696 Time Calculation (min): 45 min  Short Term Goals: Week 3: SLP Short Term Goal 1 (Week 3): Patient will attend to and utilize left upper extremity during functional tasks with mod assist question cues. SLP Short Term Goal 2 (Week 3): Patient will attent to left environment during functional tasks with mod assist verbal cues. SLP Short Term Goal 3 (Week 3): Patient will solve moderately complex problems with min assist verbal cues to self monitor and correct.   SLP Short Term Goal 4 (Week 3): Patient will select attention to propelling wheelchair to speech room with mod assist vebral cues for redirection.   Skilled Therapeutic Interventions: Patient participated in double treatment session with peer.  SLP facilitated session with categorical naming task and mod assist verbal cues to attend to turn and initiate verbal responses.  SLP also facilitated session with description game and min assist question cues to give specific information.  Patient exhibited no spontaneous attention to left upper extremity or initiation of verbal exchanges with peer during session.   FIM:  Comprehension Comprehension Mode: Auditory Comprehension: 4-Understands basic 75 - 89% of the time/requires cueing 10 - 24% of the time Expression Expression Mode: Verbal Expression: 5-Expresses basic 90% of the time/requires cueing < 10% of the time. Social Interaction Social Interaction: 4-Interacts appropriately 75 - 89% of the time - Needs redirection for appropriate language or to initiate interaction. Problem Solving Problem Solving: 2-Solves basic 25 - 49% of the time - needs direction more than half the time to initiate, plan or complete simple activities Memory Memory: 3-Recognizes or recalls 50 - 74% of the time/requires cueing 25 - 49% of the  time  Pain Pain Assessment Pain Assessment: No/denies pain Pain Score: 0-No pain  Therapy/Group: Individual Therapy  Charlane Ferretti., CCC-SLP [phone]  [name] 03/13/2013, 4:03 PM

## 2013-03-13 NOTE — Progress Notes (Signed)
Physical Therapy Session Note  Patient Details  Name: [name] MRN: [id number] Date of Birth: 06-07-1931  Today's Date: 03/13/2013 Time: 1130-1200 and 1305-1405 Time Calculation (min): 30 min and 60 min  Short Term Goals: Week 1:  PT Short Term Goal 1 (Week 1): No goals set for week 1 Week 2:  PT Short Term Goal 1 (Week 2): Patient will perform bed mobility to L and R with mod A on flat bed PT Short Term Goal 1 - Progress (Week 2): Progressing toward goal PT Short Term Goal 2 (Week 2): Patient will perform bed <> w/c transfers stand or squat pivot to L and R mod A consistently PT Short Term Goal 2 - Progress (Week 2): Progressing toward goal PT Short Term Goal 3 (Week 2): Patient will performed w/c mobility in controlled environment x 150' wtih supervision and intermittent verbal cues to attend to L environment PT Short Term Goal 3 - Progress (Week 2): Progressing toward goal PT Short Term Goal 4 (Week 2): Patient will perform gait in controlled environment x 15' with LRAD and max A  PT Short Term Goal 4 - Progress (Week 2): Met Week 3:  PT Short Term Goal 1 (Week 3): Patient will perform bed mobility to L and R with mod A on flat bed PT Short Term Goal 2 (Week 3): Patient will perform bed <> w/c transfers stand or squat pivot to L and R mod A consistently PT Short Term Goal 3 (Week 3): Patient will performed w/c mobility in controlled environment x 150' wtih supervision and intermittent verbal cues to attend to L environment PT Short Term Goal 4 (Week 3): Patient will ambulate in controlled environment x 25' with RW and mod A  PT Short Term Goal 5 (Week 3): Patient will perform up and down 4 stairs with 2 rails and mod A  Skilled Therapeutic Interventions/Progress Updates:   Patient's daughter to be present for education tomorrow.  Patient reports he would like to practice walking with her.   Therapy Documentation Precautions:  Precautions Precautions: Fall Precaution Comments:  L inatention Restrictions Weight Bearing Restrictions: No Pain: Pain Assessment Pain Assessment: No/denies pain Pain Score: 0-No pain Locomotion :  Performed w/c mobility x 150' in controlled environment with bilat foot propulsion with mod-max A and total verbal and tactile cues to attend to L environment, LLE knee extension <> flexion and to maintain attention to task.  When cued to avoid running into obstacles on L patient asked "Why does it matter."  Gave patient real life/functional examples of why not attending to L environment and running into objects may be a safety hazard.  Patient did not have response.    Continued gait training with SARA plus x 75' + with therapist providing mod A overall to maintain forward momentum, manual facilitation for R lateral weight shift and verbal and tactile cues for full LLE clearance and step length.  When asked what was difficult with gait patient responded that it is difficult because he can't feel his L foot.  Performed NMR with mirror for visual feedback for LLE position and activity.  See below  Other Treatments: Treatments Neuromuscular Facilitation: Left;Upper Extremity;Lower Extremity;Forced use;Activity to increase motor control;Activity to increase timing and sequencing;Activity to increase sustained activation;Activity to increase lateral weight shifting;Activity to increase anterior-posterior weight shifting by placing bilat heel wedges in each shoe to facilitate for anterior weight shifting in standing; performed dynamic standing and weight shifting activity with UE support on // bars with  focus on LLE extension and R lateral and anterior weight shifting with LUE reaching up, forwards and to the R for cognitive task of playing Tic tac toe on white board or identifying incorrect patterns or identifying next sequence in pattern.  Required multiple rest breaks and continued to require mod-max A to facilitate upright trunk, anterior L pelvic rotation  and R lateral weight shift; improved forward weight shifting noted with heel wedges.  Continued to require verbal and tactile cues for sit <> stand sequence and full anterior lean to bring COG over BOS.   PM session: continued NMR in standing in SARA plus with bilat UE support during LLE hip and knee flexion for RLE single limb stance x 5 seconds x 2-3 reps and then during LLE foot taps to elevated surface in front of L foot with focus on forward and R lateral weight shifts; patient became very fatigued and unable to attend to or respond to cues for LLE tapping and to reposition L foot under BOS.  Performed stand pivot mat > w/c with SARA plus and total A secondary to patient leaning posterior and unable to self correct or retro step LLE under BOS for support/stabilization.  Patient attempting to sit prior to full pivot even with total verbal, tactile cues for safety and sequencing.  Returned to room in w/c with total A.    See FIM for current functional status  Therapy/Group: Individual Therapy and Co-Treatment with recreation therapy  [name] [facility] 03/13/2013, 12:38 PM

## 2013-03-14 ENCOUNTER — Inpatient Hospital Stay (HOSPITAL_COMMUNITY): Payer: Medicare Other | Admitting: Speech Pathology

## 2013-03-14 ENCOUNTER — Inpatient Hospital Stay (HOSPITAL_COMMUNITY): Payer: Medicare Other | Admitting: Occupational Therapy

## 2013-03-14 ENCOUNTER — Inpatient Hospital Stay (HOSPITAL_COMMUNITY): Payer: Medicare Other | Admitting: Physical Therapy

## 2013-03-14 DIAGNOSIS — G8110 Spastic hemiplegia affecting unspecified side: Secondary | ICD-10-CM

## 2013-03-14 DIAGNOSIS — I6330 Cerebral infarction due to thrombosis of unspecified cerebral artery: Secondary | ICD-10-CM

## 2013-03-14 LAB — CBC
Hemoglobin: 10.4 g/dL — ABNORMAL LOW (ref 13.0–17.0)
MCHC: 33 g/dL (ref 30.0–36.0)
RBC: 3.62 MIL/uL — ABNORMAL LOW (ref 4.22–5.81)
WBC: 5.6 10*3/uL (ref 4.0–10.5)

## 2013-03-14 MED ORDER — ESCITALOPRAM OXALATE 5 MG PO TABS
5.0000 mg | ORAL_TABLET | Freq: Every day | ORAL | Status: DC
  Administered 2013-03-14 – 2013-03-25 (×12): 5 mg via ORAL
  Filled 2013-03-14 (×13): qty 1

## 2013-03-14 NOTE — Progress Notes (Signed)
Occupational Therapy Session Note  Patient Details  Name: [name] MRN: [id number] Date of Birth: 10-23-1931  Today's Date: 03/14/2013 Time: 1330-1415 Time Calculation (min): 45 min  Short Term Goals: Week 3:  OT Short Term Goal 1 (Week 3): Pt will complete bathing with min assist at sit <> stand level OT Short Term Goal 2 (Week 3): Pt will complete UB dressing with supervision OT Short Term Goal 3 (Week 3): Pt. will dress LB with moderate assist OT Short Term Goal 4 (Week 3): Pt will complete toilet transfer with min assist with use of grab bar OT Short Term Goal 5 (Week 3): Pt will complete shower transfer with min assist  Skilled Therapeutic Interventions/Progress Updates:    Pt seen for 1:1 OT with focus on transfers and forward weight shifting.  Pt's daughter present at beginning of session, discussed bathroom layout and additional safety measures to ensure pt's safety with transfers and sit <> stand in shower.  Daughter plans to take pictures of bathroom and bring Friday when she will attend full day of therapy and begin looking for non skid shower mat to place in shower to increase pt's safety and confidence with standing in shower for bathing peri area.  Engaged in squat pivot transfers x3 w/c <> mat with pt requiring mod verbal cues for sequencing and weight shifting.  Pt with increased participation with transfer to Rt this session, but requiring increased assist with transfer to Lt.  Engaged in scooting to promote forward weight shift and reaching for rings outside midline.  Use of LUE with reaching for rings to promote weight shifting, attention to Lt hand, and increased grasp release.  Pt with multiple drops with LUE and required increased time, however demonstrating good weight shift with this activity.  Therapy Documentation Precautions:  Precautions Precautions: Fall Precaution Comments: L inatention Restrictions Weight Bearing Restrictions: No Pain: Pain  Assessment Pain Assessment: No/denies pain Pain Score: 0-No pain  See FIM for current functional status  Therapy/Group: Individual Therapy  [name] 03/14/2013, 2:56 PM

## 2013-03-14 NOTE — Progress Notes (Signed)
Speech Language Pathology Daily Session Note  Patient Details  Name: [name] MRN: [id number] Date of Birth: 1931/10/26  Today's Date: 03/14/2013 Time: 1308-6578 Time Calculation (min): 25 min  Short Term Goals: Week 3: SLP Short Term Goal 1 (Week 3): Patient will attend to and utilize left upper extremity during functional tasks with mod assist question cues. SLP Short Term Goal 2 (Week 3): Patient will attent to left environment during functional tasks with mod assist verbal cues. SLP Short Term Goal 3 (Week 3): Patient will solve moderately complex problems with min assist verbal cues to self monitor and correct.   SLP Short Term Goal 4 (Week 3): Patient will select attention to propelling wheelchair to speech room with mod assist vebral cues for redirection.   Skilled Therapeutic Interventions: Treatment focus on cognitive goals. SLP facilitated by providing Max question and verbal cues for anticipatory awareness with discharge planning. Pt required total A for insight/awareness into why he shouldn't drive at discharge and Max A question and semantic cues for problem solving in regards to assistance needed with medication and money management. Pt independently verbalized he would like to hire assistance at home to ensure 24 hour supervision at discharge.    FIM:  Comprehension Comprehension Mode: Auditory Comprehension: 4-Understands basic 75 - 89% of the time/requires cueing 10 - 24% of the time Expression Expression Mode: Verbal Expression: 5-Expresses basic 90% of the time/requires cueing < 10% of the time. Social Interaction Social Interaction: 4-Interacts appropriately 75 - 89% of the time - Needs redirection for appropriate language or to initiate interaction. Problem Solving Problem Solving: 2-Solves basic 25 - 49% of the time - needs direction more than half the time to initiate, plan or complete simple activities Memory Memory: 3-Recognizes or recalls 50 - 74% of the  time/requires cueing 25 - 49% of the time  Pain Pain Assessment Pain Assessment: No/denies pain Pain Score: 0-No pain  Therapy/Group: Individual Therapy  Lindbergh Winkles 03/14/2013, 3:56 PM  Feliberto Gottron, MA, CCC-SLP [phone]

## 2013-03-14 NOTE — Progress Notes (Signed)
Social Work Patient ID: [name], male   DOB: 1931-09-08, 79 y.o.   MRN: [id number] Met with pt and daughter who was here to go through therapies with pt.  She plans to come back several more times to learn pt's care, prior to  Discharge.  Informed private duty not a covered by insurance and gave daughter list of private duty agencies.  She will begin to pursue hiring caregiver for when she Is at school.  Had daughter do see all of pt's care and asked pt to do as much as he could for himself, before asking for assistance.  Informed daughter she would need to assist Pt with toileting.  She did not today but will on Friday when here. Work toward discharge next week.

## 2013-03-14 NOTE — Progress Notes (Signed)
Physical Therapy Session Note  Patient Details  Name: [name] MRN: [id number] Date of Birth: Mar 08, 1931  Today's Date: 03/14/2013 Time: 1105-1200 Time Calculation (min): 55 min  Short Term Goals: Week 1:  PT Short Term Goal 1 (Week 1): No goals set for week 1 Week 2:  PT Short Term Goal 1 (Week 2): Patient will perform bed mobility to L and R with mod A on flat bed PT Short Term Goal 1 - Progress (Week 2): Progressing toward goal PT Short Term Goal 2 (Week 2): Patient will perform bed <> w/c transfers stand or squat pivot to L and R mod A consistently PT Short Term Goal 2 - Progress (Week 2): Progressing toward goal PT Short Term Goal 3 (Week 2): Patient will performed w/c mobility in controlled environment x 150' wtih supervision and intermittent verbal cues to attend to L environment PT Short Term Goal 3 - Progress (Week 2): Progressing toward goal PT Short Term Goal 4 (Week 2): Patient will perform gait in controlled environment x 15' with LRAD and max A  PT Short Term Goal 4 - Progress (Week 2): Met Week 3:  PT Short Term Goal 1 (Week 3): Patient will perform bed mobility to L and R with mod A on flat bed PT Short Term Goal 2 (Week 3): Patient will perform bed <> w/c transfers stand or squat pivot to L and R mod A consistently PT Short Term Goal 3 (Week 3): Patient will performed w/c mobility in controlled environment x 150' wtih supervision and intermittent verbal cues to attend to L environment PT Short Term Goal 4 (Week 3): Patient will ambulate in controlled environment x 25' with RW and mod A  PT Short Term Goal 5 (Week 3): Patient will perform up and down 4 stairs with 2 rails and mod A  Skilled Therapeutic Interventions/Progress Updates:   Patient's daughter present for family education.  Discussed with daughter home set up, equipment needs, and patient's expected level of function at D/C and requiring w/c in home.  Reviewed with daughter how patient is performing sit <>  stand and stand pivots to R and L with RW w/c <> car with mod-max A with verbal and tactile cues for L hand and foot placement, anterior lean and assistance to stand from w/c and assistance to maintain forwards and R lateral weight shift for full pivot to/from simulated car.  Patient did require min A to place LLE into and out of car.  Patient and daughter return demonstrated stand pivots w/c <> car but daughter also required cues/reminders of cues and how to provide physical assistance for safe pivot; going to car patient did not perform full pivot prior to sitting and fell into car seat and required total A to prevent fall into floor board.  Will practice real car on Friday.  Also discussed bed room; patient has very tall bed with steps.  Daughter reports there is a lower bed in an alternate bedroom.  Recommending use of alternate bedroom.  Performed simulated bed room bed <> w/c transfers with squat pivot to L and R with UE support on w/c arm rests and "night stand" with mod A overall.  Patient performed sit <> supine and rolling in bed with min A and total verbal cues for attention to and use of LLE and LUE for bridging, rolling and pushing to sit EOB.  Will continue education Friday.  Therapy Documentation Precautions:  Precautions Precautions: Fall Precaution Comments: L inatention Restrictions Weight Bearing  Restrictions: No Pain: Pain Assessment Pain Assessment: No/denies pain Pain Score: 0-No pain  See FIM for current functional status  Therapy/Group: Individual Therapy  [name] [facility] 03/14/2013, 12:19 PM

## 2013-03-14 NOTE — Progress Notes (Addendum)
Patient ID: Irma Delancey, male   DOB: [birth year], 79 y.o.   MRN: [id number]  Subjective/Complaints: 79 year-old male with history of CAD, CABG who was recently evaluated in the emergency room 2 weeks ago at [facility] with weakness of his left arm and left leg noted to have acute infarct. By report he was subsequently transferred to [facility] after evaluation Plavix was added to aspirin. He did not receive TPA per report. Patient noted to have seizure and was loaded with intravenous Dilantin. Paperwork was very minimal due to workup being done at [facility]. He was later transferred back to [facility] for ongoing physical therapy and left-sided weakness secondary to stroke.  The patient has knee pain and takes tramadol at home for this.  The patient has a history of neck pain but not currently complaining. MRI in 2008 demonstrating multilevel cervical stenosis C3-C7 levels    No CP or SOB, no left leg pain Doesn't feel that he's doing as well as he should. Admits to being depressed No seizures Low DPH + nitrates on UA, culture + Staph coag neg Review of Systems  HENT: Positive for neck pain.   Musculoskeletal: Negative for joint pain.       Chronic neck pain  Neurological: Positive for focal weakness. Negative for seizures.    Objective: Vital Signs: Blood pressure 147/81, pulse 78, temperature 98.9 F (37.2 C), temperature source Oral, resp. rate 18, height 5\' 7"  (1.702 m), weight 74.118 kg (163 lb 6.4 oz), SpO2 95.00%. No results found. Results for orders placed during the hospital encounter of 02/22/13 (from the past 72 hour(s))  BASIC METABOLIC PANEL     Status: Abnormal   Collection Time    03/12/13  8:16 AM      Result Value Range   Sodium 129 (*) 135 - 145 mEq/L   Potassium 3.4 (*) 3.5 - 5.1 mEq/L   Chloride 93 (*) 96 - 112 mEq/L   CO2 23  19 - 32 mEq/L   Glucose, Bld 117 (*) 70 - 99 mg/dL   BUN 10  6 - 23 mg/dL   Creatinine, Ser 4.09   0.50 - 1.35 mg/dL   Calcium 9.0  8.4 - 81.1 mg/dL   GFR calc non Af Amer >90  >90 mL/min   GFR calc Af Amer >90  >90 mL/min   Comment:            The eGFR has been calculated     using the CKD EPI equation.     This calculation has not been     validated in all clinical     situations.     eGFR's persistently     <90 mL/min signify     possible Chronic Kidney Disease.  CBC     Status: Abnormal   Collection Time    03/14/13  5:40 AM      Result Value Range   WBC 5.6  4.0 - 10.5 K/uL   RBC 3.62 (*) 4.22 - 5.81 MIL/uL   Hemoglobin 10.4 (*) 13.0 - 17.0 g/dL   HCT 91.4 (*) 78.2 - 95.6 %   MCV 87.0  78.0 - 100.0 fL   MCH 28.7  26.0 - 34.0 pg   MCHC 33.0  30.0 - 36.0 g/dL   RDW 21.3 (*) 08.6 - 57.8 %   Platelets 340  150 - 400 K/uL      Vitals reviewed.  Constitutional: He is oriented to person, place, and time.  He appears well-developed.  Mood/affect flat HENT:  Head: Normocephalic.  Eyes: EOM are normal.  Neck: Neck supple. No thyromegaly present.  Cardiovascular: Normal rate and regular rhythm.  Pulmonary/Chest: Effort normal and breath sounds normal. No respiratory distress.  Abdominal: Soft. Bowel sounds are normal. He exhibits no distension.  Musculoskeletal: He exhibits no edema. No calf or thigh swelling or tenderness, neg Homan's Neurological: He is alert and oriented to person, place, and time.  Follows three step commands  Motor strength is 5/5 in the right deltoid, biceps, triceps, grip, hip flexor, knee extensors, ankle dorsiflexor and plantar flexor  Motor strength is 3 minus/5 in the left deltoid, biceps, triceps, grip, hip flexor, 4 minus in the knee extensor, 3 minus in ankle dorsiflexor and plantar flexor  Sensation is intact to light touch  There is evidence of tactile extinction on double simultaneous stimulation  There is evidence of left-sided neglect on visual confrontation testing  There is a right gaze preference  Verbal output is sparse but no  evidence of dysarthria  Patient does have decreased sensation to light touch in the left. Midline chest incision well healed    Assessment/Plan: 1. Functional deficits secondary to R MCA distribution infarct which require 3+ hours per day of interdisciplinary therapy in a comprehensive inpatient rehab setting. Physiatrist is providing close team supervision and 24 hour management of active medical problems listed below. Physiatrist and rehab team continue to assess barriers to discharge/monitor patient progress toward functional and medical goals. FIM: FIM - Bathing Bathing Steps Patient Completed: Chest;Right Arm;Left Arm;Abdomen;Front perineal area;Right upper leg;Left upper leg Bathing: 3: Mod-Patient completes 5-7 13f 10 parts or 50-74%  FIM - Upper Body Dressing/Undressing Upper body dressing/undressing steps patient completed: Thread/unthread right sleeve of pullover shirt/dresss;Thread/unthread left sleeve of pullover shirt/dress;Put head through opening of pull over shirt/dress Upper body dressing/undressing: 4: Min-Patient completed 75 plus % of tasks FIM - Lower Body Dressing/Undressing Lower body dressing/undressing steps patient completed: Thread/unthread right underwear leg;Thread/unthread left underwear leg;Thread/unthread right pants leg;Thread/unthread left pants leg Lower body dressing/undressing: 2: Max-Patient completed 25-49% of tasks  FIM - Hotel manager Devices: Grab bar or rail for support Toileting: 1: Total-Patient completed zero steps, helper did all 3  FIM - Diplomatic Services operational officer Devices: Grab bars Toilet Transfers: 3-To toilet/BSC: Mod A (lift or lower assist);3-From toilet/BSC: Mod A (lift or lower assist)  FIM - Bed/Chair Transfer Bed/Chair Transfer Assistive Devices: Arm rests Bed/Chair Transfer: 1: Mechanical lift  FIM - Locomotion: Wheelchair Distance: 150 Locomotion: Wheelchair: 0: Activity did not occur FIM -  Locomotion: Ambulation Locomotion: Ambulation Assistive Devices: Sara Plus Ambulation/Gait Assistance: 1: +1 Total assist Locomotion: Ambulation: 1: Travels 50 - 149 ft with total assistance/helper does all (Pt.< 25%)  Comprehension Comprehension Mode: Auditory Comprehension: 4-Understands basic 75 - 89% of the time/requires cueing 10 - 24% of the time  Expression Expression Mode: Verbal Expression: 5-Expresses basic 90% of the time/requires cueing < 10% of the time.  Social Interaction Social Interaction Mode: Asleep Social Interaction: 4-Interacts appropriately 75 - 89% of the time - Needs redirection for appropriate language or to initiate interaction.  Problem Solving Problem Solving Mode: Asleep Problem Solving: 2-Solves basic 25 - 49% of the time - needs direction more than half the time to initiate, plan or complete simple activities  Memory Memory Mode: Asleep Memory: 3-Recognizes or recalls 50 - 74% of the time/requires cueing 25 - 49% of the time Medical Problem List and Plan:  1. Recent CVA  with left-sided weakness. Will attempt to obtain workup while at [facility]  2. DVT Prophylaxis/Anticoagulation: SCDs.Xarelto which has negative interaction with DPH 3. Mood: Zoloft 25, takes 50-75% meals on average although 100% 3/9 supper 4. Neuropsych: This patient Is capable of making decisions on his/her own behalf.  -pt admits to depression/ neuropsych reports no signs of pathopsychology   -think it would be worthwhile to start a low dose SSRI---begin lexapro 5mg  qhs  -prior notes state he was on paxil PTA 5. Pain management. Vicodin as needed for headaches  6. Seizure disorder.Dilantin  200mg  BID ,was seizure free with low DPH level.  Pt states he remembered seizure at [facility] and it affected only LUE.THis would be partial and we can use Keppra which has no Xarelto interaction.  D/W pt agrees with plan.    7. Hypothyroidism. Synthroid 125 mcg daily. We'll check old records  for thyroid studies  8. BPH history of prostate cancer. Flomax/Casodex. Check PVRs x3  9. Cervical spinal stenosis without evidence of radiculopathy  10. Bilateral knee pain osteoarthritis , continue tramadol for pain 11.  Left calf and thigh pain will check dopplers,Short segment post tibial DVT,exam stable,but new Left common femoral DVT, on full dose lovenox but will switch to xarelto 3/17, stop Plavix      12.  Hyponatremia, d/c IV .9 NS cont FR, recheck BMET .  Monitor 13  UTI- on macrodantin   LOS (Days) 20 A FACE TO FACE EVALUATION WAS PERFORMED  Lashai Grosch T 03/14/2013, 8:36 AM

## 2013-03-14 NOTE — Progress Notes (Signed)
Occupational Therapy Session Note  Patient Details  Name: [name] MRN: [id number] Date of Birth: [birth year]  Today's Date: 03/14/2013 Time: 1000-1100 Time Calculation (min): 60 min    Skilled Therapeutic Interventions/Progress Updates:    ADL re-training completed with family education with daughter observing. Patient completed bathing and dressing at sink level. Session with focus on ADL performance, functional transfers, standing and sitting balance, and LUE attention and use. Pt transferred towards right side from bed with Min Assist. Pt required min cues to initiate steps and to remain on track during bathing and dressing. Pt had difficulty using LUE during ADL but therapist and daughter provided encouragement to use it as much as possible. As patient became tired he did have increased difficulty with standing at sink and required several rest breaks due to fatigue. As he became tired he was unable to shift weight over hips and feet as he leaned posteriorly. Pt required increased assistance as he fatigued. Pt was given elastic shoelaces to increase functional performance during LB dressing. Daughter states that she is a full time student so she will definitely need to have someone with him when she is not at home. Daughter also states that they have a walk in shower with grab bar and they have a grab bar by the toilet. Informed daughter and patient about hip kit available at gift shop with AE that may be helpful at home.  Therapy Documentation Precautions:  Precautions Precautions: Fall Precaution Comments: L inatention Restrictions Weight Bearing Restrictions: No Pain: Pain Assessment Pain Assessment: No/denies pain Pain Score: 0-No pain  See FIM for current functional status  Therapy/Group: Individual Therapy  Limmie Patricia, OTR/L  03/14/2013, 11:32 AM

## 2013-03-14 NOTE — Patient Care Conference (Signed)
Inpatient RehabilitationTeam Conference and Plan of Care Update Date: 03/14/2013   Time: 10:10 Am    Patient Name: [name]      Medical Record Number: [id number]  Date of Birth: [birth year] Sex: Male         Room/Bed: 4005/4005-01 Payor Info: Payor: Advertising copywriter MEDICARE  Plan: [employer] MEDICARE COMPLETE  Product Type: *No Product type*     Admitting Diagnosis: rt cva seizures  Admit Date/Time:  02/22/2013  1:06 PM Admission Comments: No comment available   Primary Diagnosis:  CVA (cerebral infarction) Principal Problem: CVA (cerebral infarction)  Patient Active Problem List   Diagnosis Date Noted  . Acute venous embolism and thrombosis of deep vessels of proximal lower extremity 03/12/2013  . Prostate cancer   . CVA (cerebral infarction) 02/22/2013  . Spinal stenosis in cervical region 02/22/2013  . Bilateral chronic knee pain 02/22/2013  . Carotid bruit 11/15/2012  . CORONARY ATHEROSCLEROSIS NATIVE CORONARY ARTERY 01/01/2011  . MIXED HYPERLIPIDEMIA 11/26/2010  . ELEVATED BLOOD PRESSURE 11/26/2010    Expected Discharge Date: Expected Discharge Date: 03/22/13  Team Members Present: Physician leading conference: Dr. Faith Rogue Social Worker Present: Dossie Der, LCSW Nurse Present: Laural Roes, RN PT Present: [name], PT;Becky Henrene Dodge, PT;Other (comment) Clarisse Gouge Ripa_PT) OT Present: [name], Felipa Eth, OT SLP Present: Fae Pippin, SLP Other (Discipline and Name): marie Noel-PPS Coordinator     Current Status/Progress Goal Weekly Team Focus  Medical   persistent mood/cognitive deficits. very fearful with movment. poor memory and insight  improve mood, optimize sleep and cogntiive dysfunction  see prior. mood rx   Bowel/Bladder   continent bladder, bowel, lbm 3/16, encouraging pt to use bathroom toilet instead of urinal   continent bowel/bladder  encourage pt to use bathroom    Swallow/Nutrition/ Hydration     na        ADL's   mod  assist bathing and UB dressing, max assist LB dressing, max assist squat pivot transfer without UE support (grab bar), min-mod assist with grab bar.  Pt continues to push wiht transfers and is extremely fearful of falling, thus hesitant to weight shift forward with transfers and standing  min assist overall, supervision UB dressing and grooming  Lt attention, functional use of LUE, transfers, activity tolerance, family education   Mobility   regressed a little: mod-max A w/c mobility, mod-max A stand pivot transfers, static standing, gait secondary to posterior LOB  supervision-min A w/c level; mod A ambulation very short distances  attention, decreasing assistance for transfers, standing balance/postural control, gait and LLE strength    Communication             Safety/Cognition/ Behavioral Observations  mod assist   supervision   continue increasing attention, awareness, recall and initiation    Pain   pain managed with scheduled 50 mg ultram q6hours  pain 3 or less 1-10 scale   monitor for new onset of pain/effectiveness of pain meds   Skin   bruising to bue and abdomen, blanchable redness to buttocks  no new breakdown  monitor qshift      *See Interdisciplinary Assessment and Plan and progress notes for long and short-term goals  Barriers to Discharge: cognitive and neuro related deficits, lack of awareness of pt/family    Possible Resolutions to Barriers:  NMR, family education. full supervision and assistance at home    Discharge Planning/Teaching Needs:  Home with 24 hour care-daughter to begin training today.  May also hire assistance to supplement daughter  Team Discussion:  Upset not progressing like he wants-agreeable to begin Lexapro.  Family training to begin today.  Fear of falling limits progression in therapies.  Needs to push himself to do what he can and not rely upon others to do for him, ie placing urinal  Revisions to Treatment Plan:  None   Continued Need  for Acute Rehabilitation Level of Care: The patient requires daily medical management by a physician with specialized training in physical medicine and rehabilitation for the following conditions: Daily direction of a multidisciplinary physical rehabilitation program to ensure safe treatment while eliciting the highest outcome that is of practical value to the patient.: Yes Daily medical management of patient stability for increased activity during participation in an intensive rehabilitation regime.: Yes Daily analysis of laboratory values and/or radiology reports with any subsequent need for medication adjustment of medical intervention for : Other;Neurological problems (depression/ cognitive deficits)  Sueko Dimichele, Lemar Livings 03/14/2013, 2:43 PM

## 2013-03-15 ENCOUNTER — Inpatient Hospital Stay (HOSPITAL_COMMUNITY): Payer: Medicare Other | Admitting: Occupational Therapy

## 2013-03-15 ENCOUNTER — Inpatient Hospital Stay (HOSPITAL_COMMUNITY): Payer: Medicare Other | Admitting: Physical Therapy

## 2013-03-15 ENCOUNTER — Inpatient Hospital Stay (HOSPITAL_COMMUNITY): Payer: Medicare Other | Admitting: Speech Pathology

## 2013-03-15 NOTE — Progress Notes (Signed)
Occupational Therapy Session Note  Patient Details  Name: [name] MRN: [id number] Date of Birth: 1931/04/06  Today's Date: 03/15/2013 Time: 0932-1030 Time Calculation (min): 58 min  Short Term Goals: Week 3:  OT Short Term Goal 1 (Week 3): Pt will complete bathing with min assist at sit <> stand level OT Short Term Goal 2 (Week 3): Pt will complete UB dressing with supervision OT Short Term Goal 3 (Week 3): Pt. will dress LB with moderate assist OT Short Term Goal 4 (Week 3): Pt will complete toilet transfer with min assist with use of grab bar OT Short Term Goal 5 (Week 3): Pt will complete shower transfer with min assist  Skilled Therapeutic Interventions/Progress Updates:    Pt seen for ADL retraining with focus on sit <> stand, upright standing balance, initiation, sequencing, attention to task and Lt side, and increased participation in self-care tasks.  Pt completed bathing and dressing at sit <> stand level at sink with increased focus on weight shifting forward for sit to stand and trunk extension in standing.  Pt easily fatigued in standing and will attempt to sit without warning.  Use of dycem at sink in standing to increase grip on sink, pt able to release one hand at a time this session to assist with washing buttocks and pulling up pants.  Continues to require backwards chaining for donning socks, with pt demonstrating decreased grasp with LUE and therefore increased frustration with donning socks.  Pt completed 50% of grooming with LUE this session.  Therapy Documentation Precautions:  Precautions Precautions: Fall Precaution Comments: L inatention Restrictions Weight Bearing Restrictions: No Pain: Pain Assessment Pain Assessment: No/denies pain Pain Score: 0-No pain  See FIM for current functional status  Therapy/Group: Individual Therapy  [name] 03/15/2013, 10:31 AM

## 2013-03-15 NOTE — Progress Notes (Signed)
Physical Therapy Weekly Progress Note  Patient Details  Name: [name] MRN: [id number] Date of Birth: June 07, 1931  Today's Date: 03/15/2013 Time: 1105-1204 and 7829-5621 Time Calculation (min): 59 min and 45 min  Patient has made slow progress towards LTG and met 2 of 5 short term goals.  Patient is currently supervision for w/c mobility with bilat feet propulsion in controlled environment but with extra time and verbal cues to attend to task and LLE; min-mod A overall for bed mobility on flat bed, bed <> w/c transfers with squat pivot and mod-max A overall for stand pivot transfers, gait very short distances with RW and stair negotiation.  Patient continues to be limited by decreased attention to L side, decreased attention to task, perceptual impairments, apraxia and anxiety/fear of falling.  Family education has been initiated with daughter and will continue prior to D/C on 03/22/13.  Patient continues to demonstrate the following deficits: impaired activity tolerance, pain in bilat knees and low back, impaired L sided strength, motor control, timing, sequencing, motor impersistence, apraxia, impaired attention, memory, problem solving, awareness, impaired trunk and postural control, impaired dynamic balance, gait, increased falls risk and therefore will continue to benefit from skilled PT intervention to enhance overall performance with activity tolerance, balance, postural control, ability to compensate for deficits, functional use of  left upper extremity and left lower extremity, attention, awareness and coordination.  Patient progressing toward long term goals..  Continue plan of care.  PT Short Term Goals Week 3:  PT Short Term Goal 1 (Week 3): Patient will perform bed mobility to L and R with mod A on flat bed PT Short Term Goal 1 - Progress (Week 3): Met PT Short Term Goal 2 (Week 3): Patient will perform bed <> w/c transfers stand or squat pivot to L and R mod A consistently PT  Short Term Goal 2 - Progress (Week 3): Met PT Short Term Goal 3 (Week 3): Patient will performed w/c mobility in controlled environment x 150' wtih supervision and intermittent verbal cues to attend to L environment PT Short Term Goal 3 - Progress (Week 3): Progressing toward goal PT Short Term Goal 4 (Week 3): Patient will ambulate in controlled environment x 25' with RW and mod A  PT Short Term Goal 4 - Progress (Week 3): Progressing toward goal PT Short Term Goal 5 (Week 3): Patient will perform up and down 4 stairs with 2 rails and mod A PT Short Term Goal 5 - Progress (Week 3): Progressing toward goal Week 4:  PT Short Term Goal 1 (Week 4): = LTG of min A overall   Skilled Therapeutic Interventions/Progress Updates:   Patient reporting feeling sleepy but feels that he slept well last night.  Performed transfers w/c <> mat to L and R with squat and stand pivots with mod A overall with verbal and tactile cues for safe hand placement, full anterior lean to bring COG over BOS and cues to maintain hip extension during full pivot.  On mat performed stretching in reclined position on wedge for hip flexors and quads for increased hip extension and knee ROM, also performed active neck rotation to L, shoulder depression and retraction for anterior chest stretching, bilat hamstring and gastroc stretching to assist with postural control in sitting and standing.  Performed NMR for lateral and anterior weight shifting, trunk activation and LLE and LUE extensor activation and forced use in sitting edge of mat with and without LE support during reaching with RUE in various directions  out of BOS up, forwards and to the R for rings with focus on trunk elongation, anterior pelvic tilt to bring COG over BOS during anterior lean and then performing L lateral lean onto L elbow to place ring on target on floor with manual facilitation to prevent LLE hip ER for functional stretch into closed chain IR; also performed reaching  with LUE up, forwards and to the L for target without foot support for increase core/trunk activation and postural control for L trunk elongation, lateral weight shift, pelvic depression and minimize fear with reaching/leaning forwards.  Patient reports that session was "not fun, but necessary."    PM session:  Continued gait training with patient with stair negotiation up and down 3 short stairs x 2 reps with bilat UE support on rails and mod A overall with alternating sequence to ascend and descending with LLE step to sequence to focus on full LLE flexion, clearance and forward advancement of COG over BOS and LLE extension.  At top and bottom of stairs focus on full pivot of bilat feet with bilat UE support and total verbal and tactile cues for LLE placement.  Continued transfer training with sit <> stand with LUE support on RW and RUE pushing from w/c with tactile and verbal cues for trunk elongation during anterior lean during sit > stand with min-mod A.  Continued pivot training with stand pivots to L and then back to the R x 3 reps from w/c > arm chair > arm chair with UE support on RW with mod A overall for assistance to progress RW, for R lateral weight shifting and verbal and tactile cues for full clearance and placement of LLE and cues for full pivot prior to sitting in chair.  Intermittent rest breaks given.  Overall, decreased posterior and lateral LOB today.  Therapy Documentation Precautions:  Precautions Precautions: Fall Precaution Comments: L inatention Restrictions Weight Bearing Restrictions: No Pain: Pain Assessment Pain Assessment: No/denies pain Pain Score: 0-No pain  See FIM for current functional status  Therapy/Group: Individual Therapy  [name] [facility] 03/15/2013, 12:17 PM

## 2013-03-15 NOTE — Plan of Care (Signed)
Problem: RH BLADDER ELIMINATION Goal: RH STG MANAGE BLADDER WITH ASSISTANCE STG Manage Bladder With medications with minimal Assistance  Outcome: Not Progressing Pt incont this shift lg amount of urine, held urinal in place for extended period of time but soiled brief.

## 2013-03-15 NOTE — Progress Notes (Signed)
Speech Language Pathology Daily Session Note  Patient Details  Name: [name] MRN: [id number] Date of Birth: 06-14-1931  Today's Date: 03/15/2013 Time: 4696-2952 Time Calculation (min): 30 min  Short Term Goals: Week 3: SLP Short Term Goal 1 (Week 3): Patient will attend to and utilize left upper extremity during functional tasks with mod assist question cues. SLP Short Term Goal 2 (Week 3): Patient will attent to left environment during functional tasks with mod assist verbal cues. SLP Short Term Goal 3 (Week 3): Patient will solve moderately complex problems with min assist verbal cues to self monitor and correct.   SLP Short Term Goal 4 (Week 3): Patient will select attention to propelling wheelchair to speech room with mod assist vebral cues for redirection.   Skilled Therapeutic Interventions: Treatment focus on cognitive, self-care goals. SLP facilitated session with task that targeted left upper extremity with mod assist verbal cues to attend with distractions present.     FIM:  Comprehension Comprehension Mode: Auditory Comprehension: 4-Understands basic 75 - 89% of the time/requires cueing 10 - 24% of the time Expression Expression Mode: Verbal Expression: 5-Expresses basic 90% of the time/requires cueing < 10% of the time. Social Interaction Social Interaction: 4-Interacts appropriately 75 - 89% of the time - Needs redirection for appropriate language or to initiate interaction. Problem Solving Problem Solving: 2-Solves basic 25 - 49% of the time - needs direction more than half the time to initiate, plan or complete simple activities Memory Memory: 3-Recognizes or recalls 50 - 74% of the time/requires cueing 25 - 49% of the time  Pain Pain Assessment Pain Assessment: No/denies pain  Therapy/Group: Individual Therapy  Charlane Ferretti., CCC-SLP [phone]  [name] 03/15/2013, 4:28 PM

## 2013-03-15 NOTE — Plan of Care (Signed)
Problem: RH PAIN MANAGEMENT Goal: RH STG PAIN MANAGED AT OR BELOW PT'S PAIN GOAL Pt will have a pain level of 3 or less on a scale of 0/10  Outcome: Progressing Scheduled ultram effective.

## 2013-03-15 NOTE — Progress Notes (Signed)
Patient ID: [name], male   DOB: 12/16/1931, 79 y.o.   MRN: [id number]  Subjective/Complaints: 79 year-old male with history of CAD, CABG who was recently evaluated in the emergency room 2 weeks ago at [facility] with weakness of his left arm and left leg noted to have acute infarct. By report he was subsequently transferred to [facility] after evaluation Plavix was added to aspirin. He did not receive TPA per report. Patient noted to have seizure and was loaded with intravenous Dilantin. Paperwork was very minimal due to workup being done at [facility]. He was later transferred back to [facility] for ongoing physical therapy and left-sided weakness secondary to stroke.  The patient has knee pain and takes tramadol at home for this.  The patient has a history of neck pain but not currently complaining. MRI in 2008 demonstrating multilevel cervical stenosis C3-C7 levels    No specific new complaints. Still not happy with perceived lack of progress Review of Systems  HENT: Positive for neck pain.   Musculoskeletal: Negative for joint pain.       Chronic neck pain  Neurological: Positive for focal weakness. Negative for seizures.    Objective: Vital Signs: Blood pressure 131/78, pulse 74, temperature 98.2 F (36.8 C), temperature source Oral, resp. rate 20, height 5\' 7"  (1.702 m), weight 74.118 kg (163 lb 6.4 oz), SpO2 97.00%. No results found. Results for orders placed during the hospital encounter of 02/22/13 (from the past 72 hour(s))  BASIC METABOLIC PANEL     Status: Abnormal   Collection Time    03/12/13  8:16 AM      Result Value Range   Sodium 129 (*) 135 - 145 mEq/L   Potassium 3.4 (*) 3.5 - 5.1 mEq/L   Chloride 93 (*) 96 - 112 mEq/L   CO2 23  19 - 32 mEq/L   Glucose, Bld 117 (*) 70 - 99 mg/dL   BUN 10  6 - 23 mg/dL   Creatinine, Ser 9.56  0.50 - 1.35 mg/dL   Calcium 9.0  8.4 - 21.3 mg/dL   GFR calc non Af Amer >90  >90 mL/min   GFR calc  Af Amer >90  >90 mL/min   Comment:            The eGFR has been calculated     using the CKD EPI equation.     This calculation has not been     validated in all clinical     situations.     eGFR's persistently     <90 mL/min signify     possible Chronic Kidney Disease.  CBC     Status: Abnormal   Collection Time    03/14/13  5:40 AM      Result Value Range   WBC 5.6  4.0 - 10.5 K/uL   RBC 3.62 (*) 4.22 - 5.81 MIL/uL   Hemoglobin 10.4 (*) 13.0 - 17.0 g/dL   HCT 08.6 (*) 57.8 - 46.9 %   MCV 87.0  78.0 - 100.0 fL   MCH 28.7  26.0 - 34.0 pg   MCHC 33.0  30.0 - 36.0 g/dL   RDW 62.9 (*) 52.8 - 41.3 %   Platelets 340  150 - 400 K/uL      Vitals reviewed.  Constitutional: He is oriented to person, place, and time. He appears well-developed.  Mood/affect flat HENT:  Head: Normocephalic.  Eyes: EOM are normal.  Neck: Neck supple. No thyromegaly present.  Cardiovascular: Normal rate and regular rhythm.  Pulmonary/Chest: Effort normal and breath sounds normal. No respiratory distress.  Abdominal: Soft. Bowel sounds are normal. He exhibits no distension.  Musculoskeletal: He exhibits no edema. No calf or thigh swelling or tenderness, neg Homan's Neurological: He is alert and oriented to person, place, and time.  Follows three step commands. Impaired STM Motor strength is 5/5 in the right deltoid, biceps, triceps, grip, hip flexor, knee extensors, ankle dorsiflexor and plantar flexor  Motor strength is 3 minus to 3/5 in the left deltoid, biceps, triceps, grip, hip flexor, 4 minus in the knee extensor, 3 minus in ankle dorsiflexor and plantar flexor  Sensation is intact to light touch  There is evidence of tactile extinction on double simultaneous stimulation  There is evidence of left-sided neglect on visual confrontation testing  There is a right gaze preference  Verbal output is sparse but no evidence of dysarthria  Patient does have decreased sensation to light touch in the left.  Midline chest incision well healed    Assessment/Plan: 1. Functional deficits secondary to R MCA distribution infarct which require 3+ hours per day of interdisciplinary therapy in a comprehensive inpatient rehab setting. Physiatrist is providing close team supervision and 24 hour management of active medical problems listed below. Physiatrist and rehab team continue to assess barriers to discharge/monitor patient progress toward functional and medical goals. FIM: FIM - Bathing Bathing Steps Patient Completed: Chest;Right Arm;Left Arm;Abdomen;Front perineal area;Buttocks;Right upper leg;Left upper leg;Right lower leg (including foot);Left lower leg (including foot) Bathing: 5: Supervision: Safety issues/verbal cues  FIM - Upper Body Dressing/Undressing Upper body dressing/undressing steps patient completed: Thread/unthread right sleeve of pullover shirt/dresss;Thread/unthread left sleeve of pullover shirt/dress;Put head through opening of pull over shirt/dress;Pull shirt over trunk Upper body dressing/undressing: 4: Min-Patient completed 75 plus % of tasks FIM - Lower Body Dressing/Undressing Lower body dressing/undressing steps patient completed: Thread/unthread right pants leg;Thread/unthread left pants leg;Pull pants up/down;Don/Doff right sock;Don/Doff left sock;Don/Doff right shoe;Don/Doff left shoe;Fasten/unfasten right shoe;Fasten/unfasten left shoe Lower body dressing/undressing: 2: Max-Patient completed 25-49% of tasks  FIM - Hotel manager Devices: Grab bar or rail for support Toileting: 0: Activity did not occur  FIM - Diplomatic Services operational officer Devices: Grab bars Toilet Transfers: 0-Activity did not occur  FIM - Banker Devices: Arm rests;Walker Bed/Chair Transfer: 4: Supine > Sit: Min A (steadying Pt. > 75%/lift 1 leg);4: Sit > Supine: Min A (steadying pt. > 75%/lift 1 leg);3: Bed > Chair or W/C: Mod A  (lift or lower assist);3: Chair or W/C > Bed: Mod A (lift or lower assist)  FIM - Locomotion: Wheelchair Distance: 150 Locomotion: Wheelchair: 1: Total Assistance/staff pushes wheelchair (Pt<25%) FIM - Locomotion: Ambulation Locomotion: Ambulation Assistive Devices: Sara Plus Ambulation/Gait Assistance: 1: +1 Total assist Locomotion: Ambulation: 0: Activity did not occur  Comprehension Comprehension Mode: Auditory Comprehension: 4-Understands basic 75 - 89% of the time/requires cueing 10 - 24% of the time  Expression Expression Mode: Verbal Expression: 5-Expresses basic 90% of the time/requires cueing < 10% of the time.  Social Interaction Social Interaction Mode: Asleep Social Interaction: 4-Interacts appropriately 75 - 89% of the time - Needs redirection for appropriate language or to initiate interaction.  Problem Solving Problem Solving Mode: Asleep Problem Solving: 2-Solves basic 25 - 49% of the time - needs direction more than half the time to initiate, plan or complete simple activities  Memory Memory Mode: Asleep Memory: 3-Recognizes or recalls 50 - 74% of the time/requires cueing  25 - 49% of the time Medical Problem List and Plan:  1. Recent CVA with left-sided weakness. Will attempt to obtain workup while at [facility]  2. DVT Prophylaxis/Anticoagulation: SCDs.Xarelto which has negative interaction with DPH 3. Mood: Zoloft 25, takes 50-75% meals on average although 100% 3/9 supper 4. Neuropsych: This patient Is capable of making decisions on his/her own behalf.  -pt admits to depression/ neuropsych reports no signs of pathopsychology   -initiated lexapro 5mg  qhs  -pt's impaired memory feeds into depression  -prior notes state he was on paxil PTA 5. Pain management. Vicodin as needed for headaches  6. Seizure disorder.Dilantin  200mg  BID--stopped ,was seizure free with low DPH level. Now on keppra.     7. Hypothyroidism. Synthroid 125 mcg daily. We'll check old  records for thyroid studies  8. BPH history of prostate cancer. Flomax/Casodex.   9. Cervical spinal stenosis without evidence of radiculopathy  10. Bilateral knee pain osteoarthritis , continue tramadol for pain 11.  Left calf and thigh pain will check dopplers,Short segment post tibial DVT,exam stable,but new Left common femoral DVT, on full dose lovenox but will switch to xarelto 3/17, stop Plavix      12.  Hyponatremia, d/c IV .9 NS cont FR, recheck BMET .  Recheck friday 13  UTI- on macrodantin for 7 days total  LOS (Days) 21 A FACE TO FACE EVALUATION WAS PERFORMED  Undrea Archbold T 03/15/2013, 7:59 AM

## 2013-03-16 ENCOUNTER — Inpatient Hospital Stay (HOSPITAL_COMMUNITY): Payer: Medicare Other | Admitting: Physical Therapy

## 2013-03-16 ENCOUNTER — Inpatient Hospital Stay (HOSPITAL_COMMUNITY): Payer: Medicare Other | Admitting: Occupational Therapy

## 2013-03-16 ENCOUNTER — Inpatient Hospital Stay (HOSPITAL_COMMUNITY): Payer: Medicare Other | Admitting: Speech Pathology

## 2013-03-16 DIAGNOSIS — I6330 Cerebral infarction due to thrombosis of unspecified cerebral artery: Secondary | ICD-10-CM

## 2013-03-16 DIAGNOSIS — G8110 Spastic hemiplegia affecting unspecified side: Secondary | ICD-10-CM

## 2013-03-16 LAB — BASIC METABOLIC PANEL
Chloride: 95 mEq/L — ABNORMAL LOW (ref 96–112)
GFR calc non Af Amer: >90 mL/min (ref >90)
Glucose, Bld: 92 mg/dL (ref 70–99)
Potassium: 3.4 mEq/L — ABNORMAL LOW (ref 3.5–5.1)
Sodium: 131 mEq/L — ABNORMAL LOW (ref 135–145)

## 2013-03-16 MED ORDER — POTASSIUM CHLORIDE CRYS ER 20 MEQ PO TBCR
20.0000 meq | EXTENDED_RELEASE_TABLET | Freq: Every day | ORAL | Status: DC
  Administered 2013-03-16 – 2013-03-26 (×11): 20 meq via ORAL
  Filled 2013-03-16 (×12): qty 1

## 2013-03-16 NOTE — Progress Notes (Signed)
Physical Therapy Session Note  Patient Details  Name: [name] MRN: [id number] Date of Birth: 07-04-1931  Today's Date: 03/16/2013 Time: 1103-1203 and 4098-1191 Time Calculation (min): 60 min and 35 min  Short Term Goals: Week 3:  PT Short Term Goal 1 (Week 3): Patient will perform bed mobility to L and R with mod A on flat bed PT Short Term Goal 1 - Progress (Week 3): Met PT Short Term Goal 2 (Week 3): Patient will perform bed <> w/c transfers stand or squat pivot to L and R mod A consistently PT Short Term Goal 2 - Progress (Week 3): Met PT Short Term Goal 3 (Week 3): Patient will performed w/c mobility in controlled environment x 150' wtih supervision and intermittent verbal cues to attend to L environment PT Short Term Goal 3 - Progress (Week 3): Progressing toward goal PT Short Term Goal 4 (Week 3): Patient will ambulate in controlled environment x 25' with RW and mod A  PT Short Term Goal 4 - Progress (Week 3): Progressing toward goal PT Short Term Goal 5 (Week 3): Patient will perform up and down 4 stairs with 2 rails and mod A PT Short Term Goal 5 - Progress (Week 3): Progressing toward goal Week 4:  PT Short Term Goal 1 (Week 4): = LTG of min A overall   Skilled Therapeutic Interventions/Progress Updates:   Daughter present again for family education.  Focus today on real car transfer.  Verbalized and gave visual demonstration of w/c placement, set up for transfer and how to perform stand pivot w/c <> car with RW.  Daughter required mod verbal and visual cues for w/c set up; pt and daughter performed stand pivot to L with RW into car with moderate cues and guidance from therapist but with daughter providing mod physical assistance to stand from w/c and to maintain COG over BOS during pivoting and providing verbal cues to patient for sequencing full pivot with RW and with feet prior to sitting.  Required mod A to place LE into car and to reposition trunk and feet in car.  While  patient in car daughter demonstrated how to break down w/c and load in trunk and how to set up w/c for patient transfer back into w/c with max-total cues from therapist.  Also discussed with daughter purchasing transport w/c to keep in car for outings and keep w/c in house to minimize trips up/down stairs with w/c in tow.  Daughter reports she will consider this option. Patient required mod-max A to place LE out of car and pivot hips around.  Patient and daughter attempted stand pivot to R back to w/c but patient pushing strongly posterior and to the L and daughter unable to assist patient to chair; therapist took over and provided max A and total verbal cues to continue to pivot patient until safe to sit in w/c.  Daughter may require assistance of second person for car transfer or recommended possible use of slideboard for safety.  Set up family education for weekend to practice the car transfer again with daughter.  Returned to room and discussed with social worker concerns about transfers and finding caregiver assistance during the day.  PM session: focus on pre-gait and gait training in // bars for bilat UE support; patient performed sit > stand from w/c with // bars and supervision-min a with min verbal cues for L foot placement.  Patient performed 1-2 reps LLE stepping over walking pole on ground to facilitate full LLE  clearance and step length before sitting suddenly in w/c.  Patient reporting that his back pain increases significantly when he stands.  When asked how he treated pain PTA patient reports chiropractor would use a combination of heat and electrical stimulation.  Discussed with PA ordering K pad for patient to use in the room while sitting in w/c to decrease back discomfort.  PA verbalized agreement.  Continued training with gait in // bars x 12' x 3 reps forwards and one rep retro stepping and 1 rep laterally stepping to L and R with bilat UE support on // bars and min A overall with  significantly improved LLE clearance and step length with min A to maintain anterior weight and no assistance needed for R lateral weight shifting.  Performed LLE extensor strengthening, R lateral weight shift and anterior weight shift training with 8 reps LLE step ups to 2" step with bilat UE support on // bars.  Multiple rest breaks given secondary to back pain.  Therapy Documentation Precautions:  Precautions Precautions: Fall Precaution Comments: L inatention Restrictions Weight Bearing Restrictions: No Pain: Pain Assessment Pain Assessment: No/denies pain Pain Score: 0-No pain  See FIM for current functional status  Therapy/Group: Individual Therapy  [name] [facility] 03/16/2013, 12:27 PM

## 2013-03-16 NOTE — Progress Notes (Signed)
Occupational Therapy Weekly Progress Note and Treatment Note  Patient Details  Name: [name] MRN: [id number] Date of Birth: Jan 31, 1931  Today's Date: 03/16/2013 Time: 1000-1100 Time Calculation (min): 60 min  Patient has met 2 of 5 short term goals.  Ps is making steady progress towards goals.  He continues to require max encouragement for participation and benefits from grab bar to facilitate forward weight shift and increase security with sit <> stand and transfers.  Pt continues to be very fearful of falling.  Hands on education with pt's daughter who will provide majority of care has begun.  She will require additional hands on prior to d/c.  Patient continues to demonstrate the following deficits: impaired activity tolerance, decreased attention to Lt and task, Lt sided hemiplegia, impaired motor control, timing, sequencing, apraxia, impaired trunk and postural control, dynamic balance with tendency to push posterior in sitting and standing and therefore will continue to benefit from skilled OT intervention to enhance overall performance with BADL and Reduce care partner burden.  Patient progressing toward long term goals..  Plan of care revisions: Removed advanced ADL goals due to pt's request and report of housekeeper performing those tasks..  OT Short Term Goals Week 3:  OT Short Term Goal 1 (Week 3): Pt will complete bathing with min assist at sit <> stand level OT Short Term Goal 1 - Progress (Week 3): Met OT Short Term Goal 2 (Week 3): Pt will complete UB dressing with supervision OT Short Term Goal 2 - Progress (Week 3): Progressing toward goal OT Short Term Goal 3 (Week 3): Pt. will dress LB with moderate assist OT Short Term Goal 3 - Progress (Week 3): Met OT Short Term Goal 4 (Week 3): Pt will complete toilet transfer with min assist with use of grab bar OT Short Term Goal 4 - Progress (Week 3): Progressing toward goal OT Short Term Goal 5 (Week 3): Pt will complete  shower transfer with min assist OT Short Term Goal 5 - Progress (Week 3): Progressing toward goal Week 4:  OT Short Term Goal 1 (Week 4): Pt will complete UB dressing with supervision OT Short Term Goal 2 (Week 4): Pt will complete toilet transfer with min assist with use of grab bar OT Short Term Goal 3 (Week 4): Pt will complete shower transfer with min assist  Skilled Therapeutic Interventions/Progress Updates:    Pt seen for hands on family education with daughter with focus on transfers and self-care tasks of bathing and dressing.  Daughter provided cues and physical assistance with bed mobility, however required additional education regarding hand placement and body positioning to assist with squat pivot transfer to w/c.  Pt engaged in bathing at sit <> stand level in room shower with stand pivot into shower with use of grab bars and only min assist in and mod assist out.  Pt required max cues for encouragement and physical assistance with sidestepping when transferring out of shower.  Min assist sit <> stand for LB bathing and when standing for LB dressing.  Pt's daughter provided hands on assistance with sit <> stand at sink for LB dressing.  Educated daughter on encouraging pt to engage more in tasks and increased functional use of LUE.    Therapy Documentation Precautions:  Precautions Precautions: Fall Precaution Comments: L inatention Restrictions Weight Bearing Restrictions: No Pain: Pain Assessment Pain Assessment: No/denies pain Pain Score: 0-No pain ADL: ADL Grooming: Supervision/safety Where Assessed-Grooming: Sitting at sink Upper Body Bathing: Setup Where Assessed-Upper Body  Bathing: Sitting at sink;Shower Lower Body Bathing: Minimal assistance Where Assessed-Lower Body Bathing: Sitting at sink;Standing at sink;Shower Upper Body Dressing: Minimal assistance Where Assessed-Upper Body Dressing: Sitting at sink Lower Body Dressing: Moderate assistance Where  Assessed-Lower Body Dressing: Sitting at sink;Standing at sink Toileting: Moderate assistance Where Assessed-Toileting: Teacher, adult education: Moderate assistance Toilet Transfer Method: Stand pivot Toilet Transfer Equipment: Raised toilet seat;Grab bars Tub/Shower Transfer: Not assessed Film/video editor: Moderate assistance Film/video editor Method: Warden/ranger: Shower seat with back;Grab bars  See FIM for current functional status  Therapy/Group: Individual Therapy  [name] 03/16/2013, 12:34 PM

## 2013-03-16 NOTE — Progress Notes (Signed)
Social Work Patient ID: [name], male   DOB: [birth year], 79 y.o.   MRN: [id number] Met with pt and daughter who was here to do actual car transfer with Audra-PT.  They need to practice more due to pt tired after shower. Daughter is realizing how much care pt requires.  More education is needed and she needs to get the name of the man pt wants to hire to assist them At home.  Once he is obtained daughter reports it will be less stressful for her.  She is thinking about all that needs to be done prior to pt's discharge. Pt's son is flying in 3/29 and be staying for a week to assist.  Daughter to be here tomorrow to again do a car transfer and become more comfortable with His care at home.  Will continue to work on discharge with pt and daughter.

## 2013-03-16 NOTE — Progress Notes (Signed)
Speech Language Pathology Daily Session Note  Patient Details  Name: [name] MRN: [id number] Date of Birth: December 29, 1930  Today's Date: 03/16/2013 Time: 7829-5621 Time Calculation (min): 35 min  Short Term Goals: Week 3: SLP Short Term Goal 1 (Week 3): Patient will attend to and utilize left upper extremity during functional tasks with mod assist question cues. SLP Short Term Goal 2 (Week 3): Patient will attent to left environment during functional tasks with mod assist verbal cues. SLP Short Term Goal 3 (Week 3): Patient will solve moderately complex problems with min assist verbal cues to self monitor and correct.   SLP Short Term Goal 4 (Week 3): Patient will select attention to propelling wheelchair to speech room with mod assist vebral cues for redirection.   Skilled Therapeutic Interventions: Treatment focus on cognitive goals. SLP facilitated session with task that targeted attention to left environment and upper extremity as well as initiation.  Patient required overall mod assist verbal cues throughout session.       FIM:  Comprehension Comprehension Mode: Auditory Comprehension: 4-Understands basic 75 - 89% of the time/requires cueing 10 - 24% of the time Expression Expression Mode: Verbal Expression: 5-Expresses basic 90% of the time/requires cueing < 10% of the time. Social Interaction Social Interaction: 4-Interacts appropriately 75 - 89% of the time - Needs redirection for appropriate language or to initiate interaction. Problem Solving Problem Solving: 2-Solves basic 25 - 49% of the time - needs direction more than half the time to initiate, plan or complete simple activities Memory Memory: 3-Recognizes or recalls 50 - 74% of the time/requires cueing 25 - 49% of the time  Pain Pain Assessment Pain Assessment: No/denies pain  Therapy/Group: Individual Therapy  Charlane Ferretti., CCC-SLP [phone]  [name] 03/16/2013, 4:39 PM

## 2013-03-16 NOTE — Progress Notes (Signed)
Patient ID: [name], male   DOB: 1931/04/30, 79 y.o.   MRN: [id number]  Subjective/Complaints: 79 year-old male with history of CAD, CABG who was recently evaluated in the emergency room 2 weeks ago at [facility] with weakness of his left arm and left leg noted to have acute infarct. By report he was subsequently transferred to [facility] after evaluation Plavix was added to aspirin. He did not receive TPA per report. Patient noted to have seizure and was loaded with intravenous Dilantin. Paperwork was very minimal due to workup being done at [facility] He was later transferred back to [facility] for ongoing physical therapy and left-sided weakness secondary to stroke.  The patient has knee pain and takes tramadol at home for this.  The patient has a history of neck pain but not currently complaining. MRI in 2008 demonstrating multilevel cervical stenosis C3-C7 levels    In good spirits. Slept well. No complaints this am. Review of Systems  HENT: Positive for neck pain.   Musculoskeletal: Negative for joint pain.       Chronic neck pain  Neurological: Positive for focal weakness. Negative for seizures.    Objective: Vital Signs: Blood pressure 146/82, pulse 77, temperature 97.2 F (36.2 C), temperature source Oral, resp. rate 18, height 5\' 7"  (1.702 m), weight 74.118 kg (163 lb 6.4 oz), SpO2 99.00%. No results found. Results for orders placed during the hospital encounter of 02/22/13 (from the past 72 hour(s))  CBC     Status: Abnormal   Collection Time    03/14/13  5:40 AM      Result Value Range   WBC 5.6  4.0 - 10.5 K/uL   RBC 3.62 (*) 4.22 - 5.81 MIL/uL   Hemoglobin 10.4 (*) 13.0 - 17.0 g/dL   HCT 40.9 (*) 81.1 - 91.4 %   MCV 87.0  78.0 - 100.0 fL   MCH 28.7  26.0 - 34.0 pg   MCHC 33.0  30.0 - 36.0 g/dL   RDW 78.2 (*) 95.6 - 21.3 %   Platelets 340  150 - 400 K/uL  BASIC METABOLIC PANEL     Status: Abnormal   Collection Time    03/16/13   5:40 AM      Result Value Range   Sodium 131 (*) 135 - 145 mEq/L   Potassium 3.4 (*) 3.5 - 5.1 mEq/L   Chloride 95 (*) 96 - 112 mEq/L   CO2 26  19 - 32 mEq/L   Glucose, Bld 92  70 - 99 mg/dL   BUN 10  6 - 23 mg/dL   Creatinine, Ser 0.86  0.50 - 1.35 mg/dL   Calcium 8.9  8.4 - 57.8 mg/dL   GFR calc non Af Amer >90  >90 mL/min   GFR calc Af Amer >90  >90 mL/min   Comment:            The eGFR has been calculated     using the CKD EPI equation.     This calculation has not been     validated in all clinical     situations.     eGFR's persistently     <90 mL/min signify     possible Chronic Kidney Disease.      Vitals reviewed.  Constitutional: He is oriented to person, place, and time. He appears well-developed.  Mood/affect flat HENT:  Head: Normocephalic.  Eyes: EOM are normal.  Neck: Neck supple. No thyromegaly present.  Cardiovascular: Normal rate  and regular rhythm.  Pulmonary/Chest: Effort normal and breath sounds normal. No respiratory distress.  Abdominal: Soft. Bowel sounds are normal. He exhibits no distension.  Musculoskeletal: He exhibits no edema. No calf or thigh swelling or tenderness, neg Homan's Neurological: He is alert and oriented to person, place, and time.  Follows three step commands. Impaired STM Motor strength is 5/5 in the right deltoid, biceps, triceps, grip, hip flexor, knee extensors, ankle dorsiflexor and plantar flexor  Motor strength is 3   3/5 in the left deltoid, biceps, triceps, grip, hip flexor, 4 minus in the knee extensor, 3 minus to 3 in ankle dorsiflexor and plantar flexor  Sensation is intact to light touch  There is evidence of tactile extinction on double simultaneous stimulation  There is evidence of left-sided neglect on visual confrontation testing  There is a right gaze preference  Verbal output is sparse but no evidence of dysarthria  Patient does have decreased sensation to light touch in the left. Midline chest incision well  healed    Assessment/Plan: 1. Functional deficits secondary to R MCA distribution infarct which require 3+ hours per day of interdisciplinary therapy in a comprehensive inpatient rehab setting. Physiatrist is providing close team supervision and 24 hour management of active medical problems listed below. Physiatrist and rehab team continue to assess barriers to discharge/monitor patient progress toward functional and medical goals. FIM: FIM - Bathing Bathing Steps Patient Completed: Chest;Right Arm;Left Arm;Abdomen;Front perineal area;Buttocks;Right upper leg;Left upper leg;Right lower leg (including foot);Left lower leg (including foot) Bathing: 4: Steadying assist  FIM - Upper Body Dressing/Undressing Upper body dressing/undressing steps patient completed: Thread/unthread right sleeve of pullover shirt/dresss;Thread/unthread left sleeve of pullover shirt/dress;Put head through opening of pull over shirt/dress Upper body dressing/undressing: 4: Min-Patient completed 75 plus % of tasks FIM - Lower Body Dressing/Undressing Lower body dressing/undressing steps patient completed: Thread/unthread right underwear leg;Thread/unthread left underwear leg;Thread/unthread right pants leg;Thread/unthread left pants leg;Don/Doff right shoe;Don/Doff left shoe Lower body dressing/undressing: 3: Mod-Patient completed 50-74% of tasks  FIM - Hotel manager Devices: Grab bar or rail for support Toileting: 0: Activity did not occur  FIM - Diplomatic Services operational officer Devices: Therapist, music Transfers: 0-Activity did not occur  FIM - Banker Devices: Environmental consultant;Arm rests Bed/Chair Transfer: 3: Bed > Chair or W/C: Mod A (lift or lower assist);3: Chair or W/C > Bed: Mod A (lift or lower assist)  FIM - Locomotion: Wheelchair Distance: 150 Locomotion: Wheelchair: 1: Total Assistance/staff pushes wheelchair (Pt<25%) FIM - Locomotion:  Ambulation Locomotion: Ambulation Assistive Devices: Sara Plus Ambulation/Gait Assistance: 1: +1 Total assist Locomotion: Ambulation: 0: Activity did not occur  Comprehension Comprehension Mode: Auditory Comprehension: 4-Understands basic 75 - 89% of the time/requires cueing 10 - 24% of the time  Expression Expression Mode: Verbal Expression: 5-Expresses basic 90% of the time/requires cueing < 10% of the time.  Social Interaction Social Interaction Mode: Asleep Social Interaction: 4-Interacts appropriately 75 - 89% of the time - Needs redirection for appropriate language or to initiate interaction.  Problem Solving Problem Solving Mode: Asleep Problem Solving: 2-Solves basic 25 - 49% of the time - needs direction more than half the time to initiate, plan or complete simple activities  Memory Memory Mode: Asleep Memory: 3-Recognizes or recalls 50 - 74% of the time/requires cueing 25 - 49% of the time Medical Problem List and Plan:  1. Recent CVA with left-sided weakness. Will attempt to obtain workup while at [facility]  2. DVT Prophylaxis/Anticoagulation: SCDs.Xarelto  which has negative interaction with DPH 3. Mood: Zoloft 25, takes 50-75% meals on average although 100% 3/9 supper 4. Neuropsych: This patient Is capable of making decisions on his/her own behalf.  -pt admits to depression/ neuropsych reports no signs of pathopsychology   -initiated lexapro 5mg  qhs  -pt's impaired memory feeds into depression  -prior notes state he was on paxil PTA 5. Pain management. Vicodin as needed for headaches  6. Seizure disorder.Dilantin  200mg  BID--stopped ,was seizure free with low DPH level. Now on keppra.     7. Hypothyroidism. Synthroid 125 mcg daily. We'll check old records for thyroid studies  8. BPH history of prostate cancer. Flomax/Casodex.   9. Cervical spinal stenosis without evidence of radiculopathy  10. Bilateral knee pain osteoarthritis , continue tramadol for  pain 11.  Left calf and thigh pain will check dopplers,Short segment post tibial DVT,exam stable,but new Left common femoral DVT, on full dose lovenox but will switch to xarelto 3/17, stop Plavix      12.  Hyponatremia: continue FR, sodium 131. Replace potassium 13  UTI- on macrodantin for 7 days total  LOS (Days) 22 A FACE TO FACE EVALUATION WAS PERFORMED  Emerald Shor T 03/16/2013, 8:42 AM

## 2013-03-17 ENCOUNTER — Inpatient Hospital Stay (HOSPITAL_COMMUNITY): Payer: Medicare Other | Admitting: Physical Therapy

## 2013-03-17 LAB — CBC
HCT: 30 % — ABNORMAL LOW (ref 39.0–52.0)
Hemoglobin: 9.9 g/dL — ABNORMAL LOW (ref 13.0–17.0)
MCH: 28.6 pg (ref 26.0–34.0)
RBC: 3.46 MIL/uL — ABNORMAL LOW (ref 4.22–5.81)

## 2013-03-17 MED ORDER — RIVAROXABAN 20 MG PO TABS: 20.0000 mg | ORAL_TABLET | Freq: Every day | ORAL | Status: DC

## 2013-03-17 NOTE — Progress Notes (Signed)
Patient ID: [name], male   DOB: March 25, 1931, 79 y.o.   MRN: [id number]  Subjective/Complaints: 79 year-old male with history of CAD, CABG who was recently evaluated in the emergency room 2 weeks ago at [facility] with weakness of his left arm and left leg noted to have acute infarct. By report he was subsequently transferred to [facility] after evaluation Plavix was added to aspirin. He did not receive TPA per report. Patient noted to have seizure and was loaded with intravenous Dilantin. Paperwork was very minimal due to workup being done at [facility]. He was later transferred back to [facility] for ongoing physical therapy and left-sided weakness secondary to stroke.  The patient has knee pain and takes tramadol at home for this.  The patient has a history of neck pain but not currently complaining. MRI in 2008 demonstrating multilevel cervical stenosis C3-C7 levels    Eating breakfast. No specific complaints Review of Systems  HENT:     Chronic neck pain  Neurological: Positive for focal weakness. Negative for seizures.    Objective: Vital Signs: Blood pressure 137/72, pulse 87, temperature 97.8 F (36.6 C), temperature source Oral, resp. rate 19, height 5\' 7"  (1.702 m), weight 74.118 kg (163 lb 6.4 oz), SpO2 100.00%. No results found. Results for orders placed during the hospital encounter of 02/22/13 (from the past 72 hour(s))  BASIC METABOLIC PANEL     Status: Abnormal   Collection Time    03/16/13  5:40 AM      Result Value Range   Sodium 131 (*) 135 - 145 mEq/L   Potassium 3.4 (*) 3.5 - 5.1 mEq/L   Chloride 95 (*) 96 - 112 mEq/L   CO2 26  19 - 32 mEq/L   Glucose, Bld 92  70 - 99 mg/dL   BUN 10  6 - 23 mg/dL   Creatinine, Ser 7.84  0.50 - 1.35 mg/dL   Calcium 8.9  8.4 - 69.6 mg/dL   GFR calc non Af Amer >90  >90 mL/min   GFR calc Af Amer >90  >90 mL/min   Comment:            The eGFR has been calculated     using the CKD EPI equation.      This calculation has not been     validated in all clinical     situations.     eGFR's persistently     <90 mL/min signify     possible Chronic Kidney Disease.  CBC     Status: Abnormal   Collection Time    03/17/13  5:45 AM      Result Value Range   WBC 5.4  4.0 - 10.5 K/uL   RBC 3.46 (*) 4.22 - 5.81 MIL/uL   Hemoglobin 9.9 (*) 13.0 - 17.0 g/dL   HCT 29.5 (*) 28.4 - 13.2 %   MCV 86.7  78.0 - 100.0 fL   MCH 28.6  26.0 - 34.0 pg   MCHC 33.0  30.0 - 36.0 g/dL   RDW 44.0  10.2 - 72.5 %   Platelets 305  150 - 400 K/uL      Vitals reviewed.  Constitutional: He is oriented to person, place, and time. He appears well-developed.  Mood/affect flat HENT:  Head: Normocephalic.  Eyes: EOM are normal.  Neck: Neck supple. No thyromegaly present.  Cardiovascular: Normal rate and regular rhythm.  Pulmonary/Chest: Effort normal and breath sounds normal. No respiratory distress.  Abdominal: Soft.  Bowel sounds are normal. He exhibits no distension.  Musculoskeletal: He exhibits no edema. No calf or thigh swelling or tenderness, neg Homan's Neurological: He is alert and oriented to person, place, and time.  Follows three step commands. Impaired day to day memory. Conversationally appropriate Motor strength is 5/5 in the right deltoid, biceps, triceps, grip, hip flexor, knee extensors, ankle dorsiflexor and plantar flexor  Motor strength is 3   3/5 in the left deltoid, biceps, triceps, grip, hip flexor, 4 minus in the knee extensor, 3 minus to 3 in ankle dorsiflexor and plantar flexor  Sensation is intact to light touch  There is evidence of tactile extinction on double simultaneous stimulation  There is evidence of left-sided neglect on visual confrontation testing  There is a right gaze preference  Verbal output is sparse but no evidence of dysarthria  Patient does have decreased sensation to light touch in the left. Midline chest incision well healed    Assessment/Plan: 1. Functional  deficits secondary to R MCA distribution infarct which require 3+ hours per day of interdisciplinary therapy in a comprehensive inpatient rehab setting. Physiatrist is providing close team supervision and 24 hour management of active medical problems listed below. Physiatrist and rehab team continue to assess barriers to discharge/monitor patient progress toward functional and medical goals.  Working on family ed. SW notes that family is realizing what level of care he will need at dc FIM: FIM - Bathing Bathing Steps Patient Completed: Right Arm;Chest;Left Arm;Abdomen;Front perineal area;Right upper leg;Left upper leg;Right lower leg (including foot);Left lower leg (including foot) Bathing: 4: Min-Patient completes 8-9 1f 10 parts or 75+ percent  FIM - Upper Body Dressing/Undressing Upper body dressing/undressing steps patient completed: Thread/unthread right sleeve of pullover shirt/dresss;Thread/unthread left sleeve of pullover shirt/dress;Put head through opening of pull over shirt/dress Upper body dressing/undressing: 4: Min-Patient completed 75 plus % of tasks FIM - Lower Body Dressing/Undressing Lower body dressing/undressing steps patient completed: Thread/unthread right underwear leg;Thread/unthread left underwear leg;Thread/unthread right pants leg;Thread/unthread left pants leg;Don/Doff left shoe;Don/Doff right shoe Lower body dressing/undressing: 3: Mod-Patient completed 50-74% of tasks  FIM - Hotel manager Devices: Grab bar or rail for support Toileting: 0: Activity did not occur  FIM - Diplomatic Services operational officer Devices: Therapist, music Transfers: 0-Activity did not occur  FIM - Banker Devices: Arm rests Bed/Chair Transfer: 3: Supine > Sit: Mod A (lifting assist/Pt. 50-74%/lift 2 legs;3: Bed > Chair or W/C: Mod A (lift or lower assist)  FIM - Locomotion: Wheelchair Distance: 150 Locomotion:  Wheelchair: 1: Total Assistance/staff pushes wheelchair (Pt<25%) FIM - Locomotion: Ambulation Locomotion: Ambulation Assistive Devices: Sara Plus Ambulation/Gait Assistance: 1: +1 Total assist Locomotion: Ambulation: 0: Activity did not occur  Comprehension Comprehension Mode: Auditory Comprehension: 4-Understands basic 75 - 89% of the time/requires cueing 10 - 24% of the time  Expression Expression Mode: Verbal Expression: 5-Expresses basic 90% of the time/requires cueing < 10% of the time.  Social Interaction Social Interaction Mode: Asleep Social Interaction: 4-Interacts appropriately 75 - 89% of the time - Needs redirection for appropriate language or to initiate interaction.  Problem Solving Problem Solving Mode: Asleep Problem Solving: 2-Solves basic 25 - 49% of the time - needs direction more than half the time to initiate, plan or complete simple activities  Memory Memory Mode: Asleep Memory: 3-Recognizes or recalls 50 - 74% of the time/requires cueing 25 - 49% of the time Medical Problem List and Plan:  1. Recent CVA with left-sided weakness.  Will attempt to obtain workup while at [facility]  2. DVT Prophylaxis/Anticoagulation: SCDs.Xarelto which has negative interaction with DPH 3. Mood: Zoloft 25, takes 50-75% meals on average although 100% 3/9 supper 4. Neuropsych: This patient Is capable of making decisions on his/her own behalf.  -pt admits to depression/ neuropsych reports no signs of pathopsychology   -initiated lexapro 5mg  qhs  -pt's impaired memory feeds into depression  -prior notes state he was on paxil PTA 5. Pain management. Vicodin as needed for headaches  6. Seizure disorder.Dilantin  200mg  BID--stopped ,was seizure free with low DPH level. Now on keppra.     7. Hypothyroidism. Synthroid 125 mcg daily. We'll check old records for thyroid studies  8. BPH history of prostate cancer. Flomax/Casodex.   9. Cervical spinal stenosis without evidence of  radiculopathy  10. Bilateral knee pain osteoarthritis , continue tramadol for pain 11.  Left calf and thigh pain will check dopplers,Short segment post tibial DVT,exam stable,but new Left common femoral DVT, on full dose lovenox but will switch to xarelto 3/17, stop Plavix      12.  Hyponatremia: continue FR, sodium 131. Replace potassium 13  UTI- macrodantin completed  LOS (Days) 23 A FACE TO FACE EVALUATION WAS PERFORMED  Olyn Landstrom T 03/17/2013, 7:59 AM

## 2013-03-17 NOTE — Progress Notes (Signed)
Physical Therapy Session Note  Patient Details  Name: Rey Dansby MRN: [id number] Date of Birth: October 12, 1931  Today's Date: 03/17/2013 Time: 1478-2956 Time Calculation (min): 45 min  Short Term Goals: Week 1:  PT Short Term Goal 1 (Week 1): No goals set for week 1  Therapy Documentation Precautions:  Precautions Precautions: Fall Precaution Comments: L inatention Restrictions Weight Bearing Restrictions: No Pain: denies pain  Therapeutic Activity:(45') Daughter of patient ([name]) present in room for caregiver training with regards to car transfers (w/c<->car).  Patient brought to outside entrance with daughter's car and transfer training w/c<->car, front passenger seat x 5 reps.  Best result were achieved during this time by enlisting patient to reach and pull using R and sometimes L UE using car door that was stabilized by daughter's body and then verbal cues to have patient move feet around to position himself safely for sitting. Daughter was able to perform and to perfect her technique and achieve safe transfers.  Patient and daughter may benefit from one more training session on Wednesday this coming week, prior to discharge.  See FIM for current functional status  Therapy/Group: Individual Therapy  Rex Kras 03/17/2013, 5:52 PM

## 2013-03-18 ENCOUNTER — Inpatient Hospital Stay (HOSPITAL_COMMUNITY): Payer: Medicare Other | Admitting: Physical Therapy

## 2013-03-18 ENCOUNTER — Encounter (HOSPITAL_COMMUNITY): Payer: Medicare Other | Admitting: Occupational Therapy

## 2013-03-18 ENCOUNTER — Inpatient Hospital Stay (HOSPITAL_COMMUNITY): Payer: Medicare Other | Admitting: *Deleted

## 2013-03-18 NOTE — Progress Notes (Signed)
Occupational Therapy Note  Patient Details  Name: Zaryan Yakubov MRN: [id number] Date of Birth: [birth year] Today's Date: 03/18/2013  Time:  1400-1430  (30 min) Pain:  None Individual session  Practiced transfers to toilet and to tub/shower using bench. Pt. Needed max cues to take big step with left foot when ambulating to the toilet.  Needed manual facilitation with moving in transverse plane and increased time.  Transferred to toilet with mod assist and decreased eccentric control.  Pt. Ambulated to tub to transfer to bench with mod assist.  Pt. Had hard time with turning and tended to leave walker behind as well.      Humberto Seals 03/18/2013, 2:08 PM

## 2013-03-18 NOTE — Progress Notes (Signed)
Patient ID: [name], male   DOB: 12-08-1931, 79 y.o.   MRN: [id number]  Subjective/Complaints: 79 year-old male with history of CAD, CABG who was recently evaluated in the emergency room 2 weeks ago at [facility] with weakness of his left arm and left leg noted to have acute infarct. By report he was subsequently transferred to [facility] after evaluation Plavix was added to aspirin. He did not receive TPA per report. Patient noted to have seizure and was loaded with intravenous Dilantin. Paperwork was very minimal due to workup being done at [facility]. He was later transferred back to [facility] for ongoing physical therapy and left-sided weakness secondary to stroke.  The patient has knee pain and takes tramadol at home for this.  The patient has a history of neck pain but not currently complaining. MRI in 2008 demonstrating multilevel cervical stenosis C3-C7 levels    Up in beds. Slept well. Denies any problems this am Review of Systems  HENT:        Neurological: Positive for focal weakness. Negative for seizures.    Objective: Vital Signs: Blood pressure 148/75, pulse 76, temperature 97.5 F (36.4 C), temperature source Oral, resp. rate 16, height 5\' 7"  (1.702 m), weight 74.118 kg (163 lb 6.4 oz), SpO2 98.00%. No results found. Results for orders placed during the hospital encounter of 02/22/13 (from the past 72 hour(s))  BASIC METABOLIC PANEL     Status: Abnormal   Collection Time    03/16/13  5:40 AM      Result Value Range   Sodium 131 (*) 135 - 145 mEq/L   Potassium 3.4 (*) 3.5 - 5.1 mEq/L   Chloride 95 (*) 96 - 112 mEq/L   CO2 26  19 - 32 mEq/L   Glucose, Bld 92  70 - 99 mg/dL   BUN 10  6 - 23 mg/dL   Creatinine, Ser 8.29  0.50 - 1.35 mg/dL   Calcium 8.9  8.4 - 56.2 mg/dL   GFR calc non Af Amer >90  >90 mL/min   GFR calc Af Amer >90  >90 mL/min   Comment:            The eGFR has been calculated     using the CKD EPI equation.      This calculation has not been     validated in all clinical     situations.     eGFR's persistently     <90 mL/min signify     possible Chronic Kidney Disease.  CBC     Status: Abnormal   Collection Time    03/17/13  5:45 AM      Result Value Range   WBC 5.4  4.0 - 10.5 K/uL   RBC 3.46 (*) 4.22 - 5.81 MIL/uL   Hemoglobin 9.9 (*) 13.0 - 17.0 g/dL   HCT 13.0 (*) 86.5 - 78.4 %   MCV 86.7  78.0 - 100.0 fL   MCH 28.6  26.0 - 34.0 pg   MCHC 33.0  30.0 - 36.0 g/dL   RDW 69.6  29.5 - 28.4 %   Platelets 305  150 - 400 K/uL      Vitals reviewed.  Constitutional: He is oriented to person, place, and time. He appears well-developed.  Mood/affect flat HENT:  Head: Normocephalic.  Eyes: EOM are normal.  Neck: Neck supple. No thyromegaly present.  Cardiovascular: Normal rate and regular rhythm.  Pulmonary/Chest: Effort normal and breath sounds normal. No respiratory  distress.  Abdominal: Soft. Bowel sounds are normal. He exhibits no distension.  Musculoskeletal: He exhibits no edema. No calf or thigh swelling or tenderness, neg Homan's Neurological: He is alert and oriented to person, place, and time.  Follows three step commands. Impaired day to day memory. Conversationally appropriate Motor strength is 5/5 in the right deltoid, biceps, triceps, grip, hip flexor, knee extensors, ankle dorsiflexor and plantar flexor  Motor strength is 3   3/5 in the left deltoid, biceps, triceps, grip, hip flexor, 4 minus in the knee extensor, 3 minus to 3 in ankle dorsiflexor and plantar flexor  Sensation is intact to light touch  There is evidence of tactile extinction on double simultaneous stimulation  There is evidence of left-sided neglect on visual confrontation testing  There is a right gaze preference  Verbal output is sparse but no evidence of dysarthria  Patient does have decreased sensation to light touch in the left. Midline chest incision well healed    Assessment/Plan: 1. Functional  deficits secondary to R MCA distribution infarct which require 3+ hours per day of interdisciplinary therapy in a comprehensive inpatient rehab setting. Physiatrist is providing close team supervision and 24 hour management of active medical problems listed below. Physiatrist and rehab team continue to assess barriers to discharge/monitor patient progress toward functional and medical goals.  Working on family ed. SW notes that family is realizing what level of care he will need at dc FIM: FIM - Bathing Bathing Steps Patient Completed: Right Arm;Chest;Left Arm;Abdomen;Front perineal area;Right upper leg;Left upper leg;Right lower leg (including foot);Left lower leg (including foot) Bathing: 4: Min-Patient completes 8-9 16f 10 parts or 75+ percent  FIM - Upper Body Dressing/Undressing Upper body dressing/undressing steps patient completed: Thread/unthread right sleeve of pullover shirt/dresss;Thread/unthread left sleeve of pullover shirt/dress;Put head through opening of pull over shirt/dress Upper body dressing/undressing: 4: Min-Patient completed 75 plus % of tasks FIM - Lower Body Dressing/Undressing Lower body dressing/undressing steps patient completed: Thread/unthread right underwear leg;Thread/unthread left underwear leg;Thread/unthread right pants leg;Thread/unthread left pants leg;Don/Doff left shoe;Don/Doff right shoe Lower body dressing/undressing: 3: Mod-Patient completed 50-74% of tasks  FIM - Hotel manager Devices: Grab bar or rail for support Toileting: 0: Activity did not occur  FIM - Diplomatic Services operational officer Devices: Therapist, music Transfers: 0-Activity did not occur  FIM - Banker Devices: Arm rests Bed/Chair Transfer: 1: Two helpers  FIM - Locomotion: Wheelchair Distance: 150 Locomotion: Wheelchair: 1: Total Assistance/staff pushes wheelchair (Pt<25%) FIM - Locomotion: Ambulation Locomotion:  Ambulation Assistive Devices: Sara Plus Ambulation/Gait Assistance: 1: +1 Total assist Locomotion: Ambulation: 0: Activity did not occur  Comprehension Comprehension Mode: Auditory Comprehension: 5-Follows basic conversation/direction: With no assist  Expression Expression Mode: Verbal Expression: 5-Expresses basic 90% of the time/requires cueing < 10% of the time.  Social Interaction Social Interaction Mode: Asleep Social Interaction: 2-Interacts appropriately 25 - 49% of time - Needs frequent redirection.  Problem Solving Problem Solving Mode: Asleep Problem Solving: 3-Solves basic 50 - 74% of the time/requires cueing 25 - 49% of the time  Memory Memory Mode: Asleep Memory: 4-Recognizes or recalls 75 - 89% of the time/requires cueing 10 - 24% of the time Medical Problem List and Plan:  1. Recent CVA with left-sided weakness. Will attempt to obtain workup while at [facility]  2. DVT Prophylaxis/Anticoagulation: SCDs.Xarelto which has negative interaction with DPH 3. Mood: Zoloft 25, takes 50-75% meals on average although 100% 3/9 supper 4. Neuropsych: This patient Is capable  of making decisions on his/her own behalf.  -pt admits to depression/ neuropsych reports no signs of pathopsychology   -initiated lexapro 5mg  qhs  -pt's impaired memory feeds into depression  -prior notes state he was on paxil PTA 5. Pain management. Vicodin as needed for headaches  6. Seizure disorder.Dilantin  200mg  BID--stopped ,was seizure free with low DPH level. Now on keppra.     7. Hypothyroidism. Synthroid 125 mcg daily. We'll check old records for thyroid studies  8. BPH history of prostate cancer. Flomax/Casodex.   9. Cervical spinal stenosis without evidence of radiculopathy  10. Bilateral knee pain osteoarthritis , continue tramadol for pain 11.   Short segment post tibial DVT, new Left common femoral DVT, previously on full dose lovenox changed back to xarelto 3/17, stoppped Plavix       12.  Hyponatremia: continue FR, sodium 131. Replace potassium 13  UTI- macrodantin completed  LOS (Days) 24 A FACE TO FACE EVALUATION WAS PERFORMED  Alveria Mcglaughlin T 03/18/2013, 8:02 AM

## 2013-03-18 NOTE — Progress Notes (Signed)
Physical Therapy Note  Patient Details  Name: [name] MRN: [id number] Date of Birth: 02/11/1931 Today's Date: 03/18/2013  1330-1355 (25 minutes) individual Pain: 2/10 low back/ denies need for meds Focus of treatment: gait training Treatment: Sit to stand in parallel bars min assist ; 10 feet in bars min assist; gait with RW min to light mod assist 22 feet X 2 with assist to steer AD + vcs to stay within AD / mod assist when turning to sit (pt with posterior lean).   Allysa Governale,JIM 03/18/2013, 1:36 PM

## 2013-03-19 ENCOUNTER — Inpatient Hospital Stay (HOSPITAL_COMMUNITY): Payer: Medicare Other | Admitting: Occupational Therapy

## 2013-03-19 ENCOUNTER — Inpatient Hospital Stay (HOSPITAL_COMMUNITY): Payer: Medicare Other | Admitting: Speech Pathology

## 2013-03-19 ENCOUNTER — Inpatient Hospital Stay (HOSPITAL_COMMUNITY): Payer: Medicare Other | Admitting: Physical Therapy

## 2013-03-19 DIAGNOSIS — G8110 Spastic hemiplegia affecting unspecified side: Secondary | ICD-10-CM

## 2013-03-19 DIAGNOSIS — I6330 Cerebral infarction due to thrombosis of unspecified cerebral artery: Secondary | ICD-10-CM

## 2013-03-19 LAB — BASIC METABOLIC PANEL
BUN: 7 mg/dL (ref 6–23)
CO2: 25 mEq/L (ref 19–32)
Calcium: 8.8 mg/dL (ref 8.4–10.5)
Glucose, Bld: 85 mg/dL (ref 70–99)
Potassium: 4.1 mEq/L (ref 3.5–5.1)
Sodium: 131 mEq/L — ABNORMAL LOW (ref 135–145)

## 2013-03-19 MED ORDER — ASPIRIN 81 MG PO CHEW
81.0000 mg | CHEWABLE_TABLET | Freq: Every day | ORAL | Status: DC
  Administered 2013-03-19 – 2013-03-26 (×8): 81 mg via ORAL
  Filled 2013-03-19 (×8): qty 1

## 2013-03-19 NOTE — Progress Notes (Signed)
Occupational Therapy Session Note  Patient Details  Name: [name] MRN: [id number] Date of Birth: [birth year]  Today's Date: 03/19/2013 Time: 4098-1191 Time Calculation (min): 43 min  Short Term Goals: Week 4:  OT Short Term Goal 1 (Week 4): Pt will complete UB dressing with supervision OT Short Term Goal 2 (Week 4): Pt will complete toilet transfer with min assist with use of grab bar OT Short Term Goal 3 (Week 4): Pt will complete shower transfer with min assist  Skilled Therapeutic Interventions/Progress Updates:    Pt seen for 1:1 OT with focus on sit <> stand, standing balance, LUE use in functional tasks, and attention to task.  Engaged in weight shifting activity standing outside parallel bars.  Increased focus on pushing up from w/c for sit to stand and decreased pulling up on surfaces, pt with increased carryover by end of session requiring fewer cues.  Reaching outside BOS in standing while maintaining one hand on grab bar while reaching with opposite (both Rt and Lt).  [facility] with grasping small checkers from table and reaching up to place in container, pt with occasional standing without UE support for 3-5 seconds.  Maintained upright standing 3 mins with UE support without pushing backwards or attempting to sit prematurely.  Pt continues to have decreased grasp with multiple drops of checkers, but sustained attention to task this session.  Therapy Documentation Precautions:  Precautions Precautions: Fall Precaution Comments: L inatention Restrictions Weight Bearing Restrictions: No Pain: Pain Assessment Pain Assessment: No/denies pain Pain Score: 0-No pain  See FIM for current functional status  Therapy/Group: Individual Therapy  [name] 03/19/2013, 1:51 PM

## 2013-03-19 NOTE — Progress Notes (Signed)
Speech Language Pathology Weekly Progress Note  Patient Details  Name: [name] MRN: [id number] Date of Birth: 26-Jan-1931  Today's Date: 03/19/2013  Short Term Goals: Week 3: SLP Short Term Goal 1 (Week 3): Patient will attend to and utilize left upper extremity during functional tasks with mod assist question cues. SLP Short Term Goal 1 - Progress (Week 3): Met SLP Short Term Goal 2 (Week 3): Patient will attent to left environment during functional tasks with mod assist verbal cues. SLP Short Term Goal 2 - Progress (Week 3): Met SLP Short Term Goal 3 (Week 3): Patient will solve moderately complex problems with min assist verbal cues to self monitor and correct.   SLP Short Term Goal 3 - Progress (Week 3): Progressing toward goal SLP Short Term Goal 4 (Week 3): Patient will select attention to propelling wheelchair to speech room with mod assist vebral cues for redirection.  SLP Short Term Goal 4 - Progress (Week 3): Met Week 4: SLP Short Term Goal 1 (Week 4): Patient will attend to and utilize left upper extremity during functional tasks with min assist question cues. SLP Short Term Goal 2 (Week 4): Patient will attent to left environment during functional tasks with min assist verbal cues. SLP Short Term Goal 3 (Week 4): Patient will solve moderately complex problems with min assist verbal cues to self monitor and correct.   SLP Short Term Goal 4 (Week 4): Patient will select attention to propelling wheelchair to speech room with min assist vebral cues for redirection.   Weekly Progress Updates: Patient met 3 out of 4 short term objectives this reporting period due to gains in left attention during structured and unstructured tasks, increased initiation, attention and problem solving.  Patient has met all goals at a mod assist level; however, continues to require skilled SLP services to maximize functional independence prior to discharge home to reduce burden of care to a min assist  level.  Patient and family would also benefit from family education prior to discharge.    SLP Intensity: Minumum of 1-2 x/day, 30 to 90 minutes SLP Frequency: 5 out of 7 days SLP Duration/Estimated Length of Stay: 03/22/13 SLP Treatment/Interventions: Cognitive remediation/compensation;Cueing hierarchy;Environmental controls;Functional tasks;Internal/external aids;Patient/family education;Therapeutic Activities  Charlane Ferretti., CCC-SLP [phone]  [name] 03/19/2013, 4:36 PM

## 2013-03-19 NOTE — Progress Notes (Signed)
Physical Therapy Session Note  Patient Details  Name: [name] MRN: [id number] Date of Birth: 1931/11/20  Today's Date: 03/19/2013 Time: 1108-1202 Time Calculation (min): 54 min  Short Term Goals: Week 2:  PT Short Term Goal 1 (Week 2): Patient will perform bed mobility to L and R with mod A on flat bed PT Short Term Goal 1 - Progress (Week 2): Progressing toward goal PT Short Term Goal 2 (Week 2): Patient will perform bed <> w/c transfers stand or squat pivot to L and R mod A consistently PT Short Term Goal 2 - Progress (Week 2): Progressing toward goal PT Short Term Goal 3 (Week 2): Patient will performed w/c mobility in controlled environment x 150' wtih supervision and intermittent verbal cues to attend to L environment PT Short Term Goal 3 - Progress (Week 2): Progressing toward goal PT Short Term Goal 4 (Week 2): Patient will perform gait in controlled environment x 15' with LRAD and max A  PT Short Term Goal 4 - Progress (Week 2): Met Week 3:  PT Short Term Goal 1 (Week 3): Patient will perform bed mobility to L and R with mod A on flat bed PT Short Term Goal 1 - Progress (Week 3): Met PT Short Term Goal 2 (Week 3): Patient will perform bed <> w/c transfers stand or squat pivot to L and R mod A consistently PT Short Term Goal 2 - Progress (Week 3): Met PT Short Term Goal 3 (Week 3): Patient will performed w/c mobility in controlled environment x 150' wtih supervision and intermittent verbal cues to attend to L environment PT Short Term Goal 3 - Progress (Week 3): Progressing toward goal PT Short Term Goal 4 (Week 3): Patient will ambulate in controlled environment x 25' with RW and mod A  PT Short Term Goal 4 - Progress (Week 3): Progressing toward goal PT Short Term Goal 5 (Week 3): Patient will perform up and down 4 stairs with 2 rails and mod A PT Short Term Goal 5 - Progress (Week 3): Progressing toward goal Week 4:  PT Short Term Goal 1 (Week 4): = LTG of min A overall    Skilled Therapeutic Interventions/Progress Updates:   Per weekend PT note patient and daughter were able to demonstrate safe car transfer with patient UE support on car door instead of RW.  Will practice again with daughter on Wednesday.     Therapy Documentation Precautions:  Precautions Precautions: Fall Precaution Comments: L inatention Restrictions Weight Bearing Restrictions: No Pain: Pain Assessment Pain Assessment: No/denies pain Mobility:  Performed sit <> stand from w/c x 2 reps and stood to pull up brief and pants with min-mod A overall to maintain COG anterior over BOS for stabilization.   Locomotion : Ambulation Ambulation/Gait Assistance: 2: Max Scientist, clinical (histocompatibility and immunogenetics): 150Performed w/c mobility x 150' in controlled environment; attempted to perform w/c mobility with bilat UE support for increased use and attention to LUE but unable to grip rim; changed to bilat LE propulsion; required min A to perform secondary to running into wall on L; required physical assistance to maintain L hip IR during propulsion with improved LLE knee extension <> flexion to maintain continuous propulsion.  Gait with RW: x 25' with mod-max A overall in controlled environment over tiled floors with assistance and verbal cues need to to safely navigate RW to maintain straight navigation or to change direction (patient with decreased push through LUE and RUE continued to push RW to L into doorways  and wall), for lateral and anterior weight shifting and for full LLE clearance and step length.  Fatigued quickly Other Treatments: Treatments Neuromuscular Facilitation: Left;Upper Extremity;Lower Extremity;Forced use;Activity to increase motor control;Activity to increase timing and sequencing;Activity to increase sustained activation;Activity to increase lateral weight shifting;Activity to increase anterior-posterior weight shifting in squat position from w/c with bilat UE support on mat in  front of patient during: R and L lateral weight shifting and pelvic rotation with focus on dynamic stretch of L hip into IR, lateral weight shifting to R during RUE reaching along mat forwards and to the R for objects, lateral weight shifting with R and L single limb stance x 3-5 seconds at a time, and lateral and anterior weight shifting during alternating R and L forwards, retro and lateral advancement with multiple sitting rest breaks to rest patient's back.  See FIM for current functional status  Therapy/Group: Individual Therapy  [name] Faucette 03/19/2013, 12:12 PM

## 2013-03-19 NOTE — Progress Notes (Signed)
Occupational Therapy Session Note  Patient Details  Name: [name] MRN: [id number] Date of Birth: 1931/02/28  Today's Date: 03/19/2013 Time: 7829-5621 Time Calculation (min): 60 min  Short Term Goals: Week 4:  OT Short Term Goal 1 (Week 4): Pt will complete UB dressing with supervision OT Short Term Goal 2 (Week 4): Pt will complete toilet transfer with min assist with use of grab bar OT Short Term Goal 3 (Week 4): Pt will complete shower transfer with min assist  Skilled Therapeutic Interventions/Progress Updates:    Pt seen for ADL retraining with focus on sit <> stand, increased upright standing balance, initiation, sequencing, orientation of clothing, attention to task, and increased participation in self-care tasks.  Pt completed bathing and dressing at sit <> stand level at sink with increased focus on weight shifting forward for sit to stand and trunk extension in standing. Increased carryover with using Rt hand to push up from w/c and Lt hand on sink with sit <> stand.  Pt easily fatigued in standing and will attempt to sit without warning. Use of dycem at sink in standing to increase grip on sink, pt able to release one hand at a time this session to assist with washing buttocks and pulling up pants. Pt continues to require backwards chaining for donning socks, with pt demonstrating decreased grasp with LUE and therefore increased frustration with donning socks. Pt donning shoes with setup assist and cues to cross Lt leg over Rt knee to assist in donning shoe.  Therapy Documentation Precautions:  Precautions Precautions: Fall Precaution Comments: L inatention Restrictions Weight Bearing Restrictions: No  Pain: Pain Assessment Pain Assessment: No/denies pain  See FIM for current functional status  Therapy/Group: Individual Therapy  [name] 03/19/2013, 12:17 PM

## 2013-03-19 NOTE — Plan of Care (Signed)
Problem: RH BOWEL ELIMINATION Goal: RH STG MANAGE BOWEL WITH ASSISTANCE STG Manage Bowel-Mod I (prn meds to maintain bm q2days)  Outcome: Not Progressing RN disimpacted patient of large amount of formed stool on 3/24, last BM had been 3/20 before disimpaction. Patient had been refusing prn bowel medications per report from night shift RN. Hedy Camara     Problem: RH BLADDER ELIMINATION Goal: RH STG MANAGE BLADDER WITH ASSISTANCE STG Manage Bladder With medications with minimal Assistance  Outcome: Not Progressing Patient was incontinent of urine with therapy today. Hedy Camara

## 2013-03-19 NOTE — Progress Notes (Signed)
Social Work Patient ID: [name], male   DOB: 08-07-1931, 79 y.o.   MRN: [id number] Met with pt to discuss DME needed for discharge Thursday.  He was agreeable to wheelchair and walker.  He requires a Armed forces training and education officer to be able to self propel and uses for self care.  He is unable to self propel a standard wheelchair.  He reports daughter did car Transfer this weekend and it went better.  Daughter to come in to do more hands on care prior to discharge.

## 2013-03-19 NOTE — Progress Notes (Signed)
Likes to prop urinal, but still incontinent at times. Flat affect. Bilateral heels red and elevated off bed with pillow. Sacrum red and turning, but patient will refuse to turn at times. Alfredo Martinez A

## 2013-03-19 NOTE — Progress Notes (Signed)
Patient ID: [name], male   DOB: March 22, 1931, 79 y.o.   MRN: [id number]  Subjective/Complaints: 79 year-old male with history of CAD, CABG who was recently evaluated in the emergency room 2 weeks ago at [facility] with weakness of his left arm and left leg noted to have acute infarct. By report he was subsequently transferred to [facility] after evaluation Plavix was added to aspirin. He did not receive TPA per report. Patient noted to have seizure and was loaded with intravenous Dilantin. Paperwork was very minimal due to workup being done at [facility]. He was later transferred back to [facility] for ongoing physical therapy and left-sided weakness secondary to stroke.  The patient has knee pain and takes tramadol at home for this.  The patient has a history of neck pain but not currently complaining. MRI in 2008 demonstrating multilevel cervical stenosis C3-C7 levels  No seizures No BM since yesterday No bladder c/os   Review of Systems  HENT:        Neurological: Positive for focal weakness. Negative for seizures.    Objective: Vital Signs: Blood pressure 129/85, pulse 66, temperature 97.5 F (36.4 C), temperature source Oral, resp. rate 19, height 5\' 7"  (1.702 m), weight 74.118 kg (163 lb 6.4 oz), SpO2 99.00%. No results found. Results for orders placed during the hospital encounter of 02/22/13 (from the past 72 hour(s))  CBC     Status: Abnormal   Collection Time    03/17/13  5:45 AM      Result Value Range   WBC 5.4  4.0 - 10.5 K/uL   RBC 3.46 (*) 4.22 - 5.81 MIL/uL   Hemoglobin 9.9 (*) 13.0 - 17.0 g/dL   HCT 78.2 (*) 95.6 - 21.3 %   MCV 86.7  78.0 - 100.0 fL   MCH 28.6  26.0 - 34.0 pg   MCHC 33.0  30.0 - 36.0 g/dL   RDW 08.6  57.8 - 46.9 %   Platelets 305  150 - 400 K/uL      Vitals reviewed.  Constitutional: He is oriented to person, place, and time. He appears well-developed.  Mood/affect flat HENT:  Head: Normocephalic.   Eyes: EOM are normal.  Neck: Neck supple. No thyromegaly present.  Cardiovascular: Normal rate and regular rhythm.  Pulmonary/Chest: Effort normal and breath sounds normal. No respiratory distress.  Abdominal: Soft. Bowel sounds are normal. He exhibits no distension.  Musculoskeletal: He exhibits no edema. No calf or thigh swelling or tenderness, neg Homan's Neurological: He is alert and oriented to person, place, and time.  Follows three step commands. Impaired day to day memory. Conversationally appropriate Motor strength is 5/5 in the right deltoid, biceps, triceps, grip, hip flexor, knee extensors, ankle dorsiflexor and plantar flexor  Motor strength is 3   3/5 in the left deltoid, biceps, triceps, grip, hip flexor, 4 minus in the knee extensor, 3 minus to 3 in ankle dorsiflexor and plantar flexor  Sensation is intact to light touch  There is evidence of tactile extinction on double simultaneous stimulation  There is evidence of left-sided neglect on visual confrontation testing  There is a right gaze preference  Verbal output is sparse but no evidence of dysarthria  Patient does have decreased sensation to light touch in the left. Midline chest incision well healed    Assessment/Plan: 1. Functional deficits secondary to R MCA distribution infarct which require 3+ hours per day of interdisciplinary therapy in a comprehensive inpatient rehab setting. Physiatrist is  providing close team supervision and 24 hour management of active medical problems listed below. Physiatrist and rehab team continue to assess barriers to discharge/monitor patient progress toward functional and medical goals.  Working on family ed. SW notes that family is realizing what level of care he will need at dc FIM: FIM - Bathing Bathing Steps Patient Completed: Right Arm;Chest;Left Arm;Abdomen;Front perineal area;Right upper leg;Left upper leg;Right lower leg (including foot);Left lower leg (including  foot) Bathing: 4: Min-Patient completes 8-9 81f 10 parts or 75+ percent  FIM - Upper Body Dressing/Undressing Upper body dressing/undressing steps patient completed: Thread/unthread right sleeve of pullover shirt/dresss;Thread/unthread left sleeve of pullover shirt/dress;Put head through opening of pull over shirt/dress Upper body dressing/undressing: 4: Min-Patient completed 75 plus % of tasks FIM - Lower Body Dressing/Undressing Lower body dressing/undressing steps patient completed: Thread/unthread right underwear leg;Thread/unthread left underwear leg;Thread/unthread right pants leg;Thread/unthread left pants leg;Don/Doff left shoe;Don/Doff right shoe Lower body dressing/undressing: 3: Mod-Patient completed 50-74% of tasks  FIM - Hotel manager Devices: Grab bar or rail for support Toileting: 0: Activity did not occur  FIM - Diplomatic Services operational officer Devices: Therapist, music Transfers: 0-Activity did not occur  FIM - Banker Devices: Arm rests Bed/Chair Transfer: 1: Two helpers  FIM - Locomotion: Wheelchair Distance: 150 Locomotion: Wheelchair: 1: Total Assistance/staff pushes wheelchair (Pt<25%) FIM - Locomotion: Ambulation Locomotion: Ambulation Assistive Devices: Sara Plus Ambulation/Gait Assistance: 1: +1 Total assist Locomotion: Ambulation: 0: Activity did not occur  Comprehension Comprehension Mode: Auditory Comprehension: 5-Follows basic conversation/direction: With no assist  Expression Expression Mode: Verbal Expression: 5-Expresses basic 90% of the time/requires cueing < 10% of the time.  Social Interaction Social Interaction Mode: Asleep Social Interaction: 2-Interacts appropriately 25 - 49% of time - Needs frequent redirection.  Problem Solving Problem Solving Mode: Asleep Problem Solving: 3-Solves basic 50 - 74% of the time/requires cueing 25 - 49% of the time  Memory Memory Mode:  Asleep Memory: 4-Recognizes or recalls 75 - 89% of the time/requires cueing 10 - 24% of the time Medical Problem List and Plan:  1. Recent CVA with left-sided weakness. Will attempt to obtain workup while at [facility]  2. DVT Prophylaxis/Anticoagulation: SCDs.Xarelto which has negative interaction with DPH 3. Mood: Zoloft 25, takes 50-75% meals on average although 100% 3/9 supper 4. Neuropsych: This patient Is capable of making decisions on his/her own behalf.  -pt admits to depression/ neuropsych reports no signs of pathopsychology   -initiated lexapro 5mg  qhs  -pt's impaired memory feeds into depression  -prior notes state he was on paxil PTA 5. Pain management. Vicodin as needed for headaches  6. Seizure disorder.Dilantin  200mg  BID--stopped ,was seizure free with low DPH level. Now on keppra.     7. Hypothyroidism. Synthroid 125 mcg daily. We'll check old records for thyroid studies  8. BPH history of prostate cancer. Flomax/Casodex.   9. Cervical spinal stenosis without evidence of radiculopathy  10. Bilateral knee pain osteoarthritis , continue tramadol for pain 11.   Short segment post tibial DVT, new Left common femoral DVT,changed back to xarelto 3/17, off Plavix, change to baby ASA     12.  Hyponatremia: continue FR, sodium 131. Replace potassium 13  UTI- macrodantin completed  LOS (Days) 25 A FACE TO FACE EVALUATION WAS PERFORMED  KIRSTEINS,ANDREW E 03/19/2013, 7:25 AM

## 2013-03-19 NOTE — Progress Notes (Signed)
Speech Language Pathology Daily Session Note  Patient Details  Name: [name] MRN: [id number] Date of Birth: 1931-05-05  Today's Date: 03/19/2013 Time: 7829-5621 Time Calculation (min): 30 min  Short Term Goals: Week 3: SLP Short Term Goal 1 (Week 3): Patient will attend to and utilize left upper extremity during functional tasks with mod assist question cues. SLP Short Term Goal 2 (Week 3): Patient will attent to left environment during functional tasks with mod assist verbal cues. SLP Short Term Goal 3 (Week 3): Patient will solve moderately complex problems with min assist verbal cues to self monitor and correct.   SLP Short Term Goal 4 (Week 3): Patient will select attention to propelling wheelchair to speech room with mod assist vebral cues for redirection.   Skilled Therapeutic Interventions: Treatment session focused on addressing cognitive-linguistic goals. SLP facilitated session with task that targeted attention to left environment and upper extremity as well as initiation.  Patient required overall min assist verbal cues throughout session.       FIM:  Comprehension Comprehension Mode: Auditory Comprehension: 5-Understands basic 90% of the time/requires cueing < 10% of the time Expression Expression Mode: Verbal Expression: 5-Expresses basic 90% of the time/requires cueing < 10% of the time. Social Interaction Social Interaction: 4-Interacts appropriately 75 - 89% of the time - Needs redirection for appropriate language or to initiate interaction. Problem Solving Problem Solving: 3-Solves basic 50 - 74% of the time/requires cueing 25 - 49% of the time Memory Memory: 4-Recognizes or recalls 75 - 89% of the time/requires cueing 10 - 24% of the time  Pain Pain Assessment Pain Assessment: No/denies pain Pain Score: 0-No pain  Therapy/Group: Individual Therapy  Charlane Ferretti., CCC-SLP [phone]  Makeyla Govan 03/19/2013, 4:28 PM

## 2013-03-20 ENCOUNTER — Inpatient Hospital Stay (HOSPITAL_COMMUNITY): Payer: Medicare Other | Admitting: Speech Pathology

## 2013-03-20 ENCOUNTER — Inpatient Hospital Stay (HOSPITAL_COMMUNITY): Payer: Medicare Other | Admitting: Occupational Therapy

## 2013-03-20 ENCOUNTER — Inpatient Hospital Stay (HOSPITAL_COMMUNITY): Payer: Medicare Other | Admitting: Physical Therapy

## 2013-03-20 LAB — CBC
Hemoglobin: 10.2 g/dL — ABNORMAL LOW (ref 13.0–17.0)
MCH: 28.2 pg (ref 26.0–34.0)
MCV: 85.1 fL (ref 78.0–100.0)
RBC: 3.62 MIL/uL — ABNORMAL LOW (ref 4.22–5.81)

## 2013-03-20 MED ORDER — SENNOSIDES-DOCUSATE SODIUM 8.6-50 MG PO TABS
1.0000 | ORAL_TABLET | Freq: Two times a day (BID) | ORAL | Status: DC
  Administered 2013-03-20 – 2013-03-26 (×12): 1 via ORAL
  Filled 2013-03-20 (×12): qty 1

## 2013-03-20 NOTE — Progress Notes (Signed)
Speech Language Pathology Daily Session Note  Patient Details  Name: [name] MRN: [id number] Date of Birth: [birth year]  Today's Date: 03/20/2013 Time: 7829-5621 Time Calculation (min): 40 min  Short Term Goals: Week 4: SLP Short Term Goal 1 (Week 4): Patient will attend to and utilize left upper extremity during functional tasks with min assist question cues. SLP Short Term Goal 2 (Week 4): Patient will attent to left environment during functional tasks with min assist verbal cues. SLP Short Term Goal 3 (Week 4): Patient will solve moderately complex problems with min assist verbal cues to self monitor and correct.   SLP Short Term Goal 4 (Week 4): Patient will select attention to propelling wheelchair to speech room with min assist vebral cues for redirection.   Skilled Therapeutic Interventions: Skilled treatment session focused on cognitive-linguistic goals with peer interaction in a mildly distracting environment.  SLP facilitated session with introductions and min assist demonstrative cues for learning a new game.  Patient required mod assist clinician cues to select attention to turn and self-monitor/correct initiation of verbalizations throughout session.   FIM:  Comprehension Comprehension Mode: Auditory Comprehension: 5-Understands basic 90% of the time/requires cueing < 10% of the time Expression Expression Mode: Verbal Expression: 5-Expresses basic 90% of the time/requires cueing < 10% of the time. Social Interaction Social Interaction Mode: Not assessed Social Interaction: 4-Interacts appropriately 75 - 89% of the time - Needs redirection for appropriate language or to initiate interaction. Problem Solving Problem Solving Mode: Not assessed Problem Solving: 3-Solves basic 50 - 74% of the time/requires cueing 25 - 49% of the time Memory Memory Mode: Not assessed Memory: 4-Recognizes or recalls 75 - 89% of the time/requires cueing 10 - 24% of the time  Pain Pain  Assessment Pain Assessment: No/denies pain  Therapy/Group: Individual Therapy  Charlane Ferretti., CCC-SLP [phone]  [name] 03/20/2013, 5:01 PM

## 2013-03-20 NOTE — Progress Notes (Signed)
Social Work Patient ID: [name], male   DOB: 09/03/1931, 79 y.o.   MRN: [id number] Spoke with daughter to come in tomorrow and continue family education in anticipation of discharge Thurs.  Have spoken with  Private caregiver and will meet with on Sat to discuss his hours.  Son also flying in on Sat to assist.  Work toward discharge Thurs.

## 2013-03-20 NOTE — Progress Notes (Signed)
Patient ID: [name], male   DOB: 08-Jan-1931, 79 y.o.   MRN: [id number]  Subjective/Complaints: 79 year-old 79 male with history of CAD, CABG who was recently evaluated in the emergency room 2 weeks ago at [facility] with weakness of his left arm and left leg noted to have acute infarct. By report he was subsequently transferred to [facility] after evaluation Plavix was added to aspirin. He did not receive TPA per report. Patient noted to have seizure and was loaded with intravenous Dilantin. Paperwork was very minimal due to workup being done at [facility]. He was later transferred back to [facility] for ongoing physical therapy and left-sided weakness secondary to stroke.  The patient has knee pain and takes tramadol at home for this.  The patient has a history of neck pain but not currently complaining. MRI in 2008 demonstrating multilevel cervical stenosis C3-C7 levels  No seizures No BM since yesterday No bladder c/os   Review of Systems  HENT:        Neurological: Positive for focal weakness. Negative for seizures.    Objective: Vital Signs: Blood pressure 140/60, pulse 76, temperature 97.2 F (36.2 C), temperature source Oral, resp. rate 18, height 5\' 7"  (1.702 m), weight 74.118 kg (163 lb 6.4 oz), SpO2 100.00%. No results found. Results for orders placed during the hospital encounter of 02/22/13 (from the past 72 hour(s))  BASIC METABOLIC PANEL     Status: Abnormal   Collection Time    03/19/13  7:30 AM      Result Value Range   Sodium 131 (*) 135 - 145 mEq/L   Potassium 4.1  3.5 - 5.1 mEq/L   Chloride 97  96 - 112 mEq/L   CO2 25  19 - 32 mEq/L   Glucose, Bld 85  70 - 99 mg/dL   BUN 7  6 - 23 mg/dL   Creatinine, Ser 4.09  0.50 - 1.35 mg/dL   Calcium 8.8  8.4 - 81.1 mg/dL   GFR calc non Af Amer >90  >90 mL/min   GFR calc Af Amer >90  >90 mL/min   Comment:            The eGFR has been calculated     using the CKD EPI equation.     This  calculation has not been     validated in all clinical     situations.     eGFR's persistently     <90 mL/min signify     possible Chronic Kidney Disease.      Vitals reviewed.  Constitutional: He is oriented to person, place, and time. He appears well-developed.  Mood/affect flat HENT:  Head: Normocephalic.  Eyes: EOM are normal.  Neck: Neck supple. No thyromegaly present.  Cardiovascular: Normal rate and regular rhythm.  Pulmonary/Chest: Effort normal and breath sounds normal. No respiratory distress.  Abdominal: Soft. Bowel sounds are normal. He exhibits no distension.  Musculoskeletal: He exhibits no edema. No calf or thigh swelling or tenderness, neg Homan's Neurological: He is alert and oriented to person, place, and time.  Follows three step commands. Impaired day to day memory. Conversationally appropriate Motor strength is 5/5 in the right deltoid, biceps, triceps, grip, hip flexor, knee extensors, ankle dorsiflexor and plantar flexor  Motor strength is 3   3/5 in the left deltoid, biceps, triceps, grip, hip flexor, 4 minus in the knee extensor, 3 minus to 3 in ankle dorsiflexor and plantar flexor  Sensation is intact to light  touch  There is evidence of tactile extinction on double simultaneous stimulation  There is evidence of left-sided neglect on visual confrontation testing  There is a right gaze preference  Verbal output is sparse but no evidence of dysarthria  Patient does have decreased sensation to light touch in the left. Midline chest incision well healed    Assessment/Plan: 1. Functional deficits secondary to R MCA distribution infarct which require 3+ hours per day of interdisciplinary therapy in a comprehensive inpatient rehab setting. Physiatrist is providing close team supervision and 24 hour management of active medical problems listed below. Physiatrist and rehab team continue to assess barriers to discharge/monitor patient progress toward functional and  medical goals.  Working on family ed. SW notes that family is realizing what level of care he will need at dc FIM: FIM - Bathing Bathing Steps Patient Completed: Chest;Right Arm;Left Arm;Abdomen;Front perineal area;Buttocks;Right upper leg;Left upper leg;Right lower leg (including foot);Left lower leg (including foot) Bathing: 4: Steadying assist  FIM - Upper Body Dressing/Undressing Upper body dressing/undressing steps patient completed: Thread/unthread right sleeve of pullover shirt/dresss;Thread/unthread left sleeve of pullover shirt/dress;Put head through opening of pull over shirt/dress Upper body dressing/undressing: 4: Min-Patient completed 75 plus % of tasks FIM - Lower Body Dressing/Undressing Lower body dressing/undressing steps patient completed: Thread/unthread right pants leg;Thread/unthread left pants leg;Pull pants up/down;Don/Doff right shoe;Don/Doff left shoe Lower body dressing/undressing: 3: Mod-Patient completed 50-74% of tasks  FIM - Hotel manager Devices: Grab bar or rail for support Toileting: 0: Activity did not occur  FIM - Diplomatic Services operational officer Devices: Grab bars Toilet Transfers: 3-To toilet/BSC: Mod A (lift or lower assist);3-From toilet/BSC: Mod A (lift or lower assist)  FIM - Bed/Chair Transfer Bed/Chair Transfer Assistive Devices: Therapist, occupational: 3: Chair or W/C > Bed: Mod A (lift or lower assist);3: Bed > Chair or W/C: Mod A (lift or lower assist)  FIM - Locomotion: Wheelchair Distance: 150 Locomotion: Wheelchair: 4: Travels 150 ft or more: maneuvers on rugs and over door sillls with minimal assistance (Pt.>75%) FIM - Locomotion: Ambulation Locomotion: Ambulation Assistive Devices: Designer, industrial/product Ambulation/Gait Assistance: 2: Max assist Locomotion: Ambulation: 1: Travels less than 50 ft with maximal assistance (Pt: 25 - 49%)  Comprehension Comprehension Mode: Auditory Comprehension: 4-Understands  basic 75 - 89% of the time/requires cueing 10 - 24% of the time  Expression Expression Mode: Verbal Expression: 5-Expresses basic 90% of the time/requires cueing < 10% of the time.  Social Interaction Social Interaction Mode: Asleep Social Interaction: 4-Interacts appropriately 75 - 89% of the time - Needs redirection for appropriate language or to initiate interaction.  Problem Solving Problem Solving Mode: Asleep Problem Solving: 3-Solves basic 50 - 74% of the time/requires cueing 25 - 49% of the time  Memory Memory Mode: Asleep Memory: 3-Recognizes or recalls 50 - 74% of the time/requires cueing 25 - 49% of the time Medical Problem List and Plan:  1. Recent CVA with left-sided weakness. Will attempt to obtain workup while at [facility]  2. DVT Prophylaxis/Anticoagulation: SCDs.Xarelto which has negative interaction with DPH 3. Mood:Lexapro 5 mg, sleeps ok 4. Neuropsych: This patient Is capable of making decisions on his/her own behalf.  -pt admits to depression/ neuropsych reports no signs of pathopsychology   -initiated lexapro 5mg  qhs  -pt's impaired memory feeds into depression  -prior notes state he was on paxil PTA 5. Pain management. Vicodin as needed for headaches  6. Seizure disorder.Dilantin  200mg  BID--stopped ,was seizure free with low DPH level.  Now on keppra.     7. Hypothyroidism. Synthroid 125 mcg daily. We'll check old records for thyroid studies  8. BPH history of prostate cancer. Flomax/Casodex.   9. Cervical spinal stenosis without evidence of radiculopathy  10. Bilateral knee pain osteoarthritis , continue tramadol for pain 11.   Short segment post tibial DVT, new Left common femoral DVT,changed back to xarelto 3/17, off Plavix, change to baby ASA     12.  Hyponatremia: continue FR, sodium 131. Recheck BMET prior to D/C 13  UTI- macrodantin completed  LOS (Days) 26 A FACE TO FACE EVALUATION WAS PERFORMED  [name] E 03/20/2013, 6:55 AM

## 2013-03-20 NOTE — Progress Notes (Signed)
Physical Therapy Session Note  Patient Details  Name: [name] MRN: [id number] Date of Birth: 05/21/1931  Today's Date: 03/20/2013 Time: 1120-1210 Time Calculation (min): 50 min  Short Term Goals: Week 3:  PT Short Term Goal 1 (Week 3): Patient will perform bed mobility to L and R with mod A on flat bed PT Short Term Goal 1 - Progress (Week 3): Met PT Short Term Goal 2 (Week 3): Patient will perform bed <> w/c transfers stand or squat pivot to L and R mod A consistently PT Short Term Goal 2 - Progress (Week 3): Met PT Short Term Goal 3 (Week 3): Patient will performed w/c mobility in controlled environment x 150' wtih supervision and intermittent verbal cues to attend to L environment PT Short Term Goal 3 - Progress (Week 3): Progressing toward goal PT Short Term Goal 4 (Week 3): Patient will ambulate in controlled environment x 25' with RW and mod A  PT Short Term Goal 4 - Progress (Week 3): Progressing toward goal PT Short Term Goal 5 (Week 3): Patient will perform up and down 4 stairs with 2 rails and mod A PT Short Term Goal 5 - Progress (Week 3): Progressing toward goal Week 4:  PT Short Term Goal 1 (Week 4): = LTG of min A overall   Therapy Documentation Precautions:  Precautions Precautions: Fall Precaution Comments: L inatention Restrictions Weight Bearing Restrictions: No General: Amount of Missed PT Time (min): 10 Minutes Missed Time Reason: Other (comment) (prolonged use of urinal) Pain: Pain Assessment Pain Assessment: No/denies pain Mobility:  Patient requesting to use urinal at beginning of session; performed sit <> stand from w/c with mod A and verbal cues for L hand and foot placement and stood with min A overall to doff pants and brief and use of RUE to place and hold urinal.  Required verbal cues to maintain anterior and R lateral weight shift with RUE off of RW.  Secondary to fatigue patient returned to sitting and time was given to allow patient to use  urinal in sitting.  In gym performed stand pivots w/c <> mat with mod A overall with verbal cues for L hand and foot placement, lifting assistance to stand and bring COG over BOS and verbal questioning cues and visual cues for safety secondary to patient continuing to not advance LLE and attempting to sit prior to full pivot; once again discussed safety with transfers and risk for falling if not fully pivoted.    Other Treatments: Treatments Neuromuscular Facilitation: Left;Upper Extremity;Activity to increase coordination;Activity to increase motor control;Activity to increase timing and sequencing;Activity to increase sustained activation;Activity to increase lateral weight shifting;Activity to increase anterior-posterior weight shifting in sitting on pink balance disc with focus on anterior/posterior, lateral pelvic tilts with pelvic depression and elevation with trunk elongation and lateral elongation/shortening for anterior and lateral weight shifting to maintain COG over BOS secondary to patient tendency to lean posterior and to the L.  Added in bilat UE AAROM reaching with bilat UE holding stick and reaching with bilat UE to bring stick to desired target out of BOS in various directions forwards, to L, R reaching across midline to further facilitate trunk elongation, rotation, weight shifting and anterior translation of COG over BOS.  Patient required max-total verbal and visual cues to attend to LUE position and attention to task.    See FIM for current functional status  Therapy/Group: Individual Therapy  [name] [facility] 03/20/2013, 12:18 PM

## 2013-03-20 NOTE — Progress Notes (Signed)
Occupational Therapy Session Note  Patient Details  Name: [name] MRN: [id number] Date of Birth: 01/20/1931  Today's Date: 03/20/2013 Time: 1000-1100 and 4098-1191 Time Calculation (min): 60 min and 30 min  Short Term Goals: Week 4:  OT Short Term Goal 1 (Week 4): Pt will complete UB dressing with supervision OT Short Term Goal 2 (Week 4): Pt will complete toilet transfer with min assist with use of grab bar OT Short Term Goal 3 (Week 4): Pt will complete shower transfer with min assist  Skilled Therapeutic Interventions/Progress Updates:    1) Pt seen for ADL retraining with focus on sit <> stand, increased upright standing balance, initiation, sequencing, orientation of clothing, attention to task, and increased participation in self-care tasks. Pt completed bathing and dressing at sit <> stand level at sink with increased focus on weight shifting forward for sit to stand and trunk extension in standing. Increased carryover with using Rt hand to push up from w/c and Lt hand on sink with sit <> stand. Continued use of dycem at sink in standing to increase grip on sink to increase pt participation, pt able to release one hand at a time this session to assist with washing buttocks and pulling up pants. Pt continues to require backwards chaining for donning socks, with pt demonstrating decreased grasp with LUE. Pt donning shoes with setup assist and cues to cross Lt leg over Rt knee to assist in adjusting shoe.   2) Pt seen for 1:1 OT with focus on functional use of LUE and attention to Lt side.  Engaged in pipe tree activity with pt scanning to Lt to obtain appropriate piece with Lt hand.  Pt required increased time to manipulate pieces in Lt hand secondary to decreased grasp and decreased ability to maintain grasp while moving arm across his body.  Pt with decreased grasp when attempting to unassemble activity; moderate cues to ask for assistance when needed.  Therapy  Documentation Precautions:  Precautions Precautions: Fall Precaution Comments: L inatention Restrictions Weight Bearing Restrictions: No General: General Missed Time Reason: Other (comment) (prolonged use of urinal) Vital Signs:   Pain: Pain Assessment Pain Assessment: No/denies pain  See FIM for current functional status  Therapy/Group: Individual Therapy  [name] 03/20/2013, 12:19 PM

## 2013-03-21 ENCOUNTER — Inpatient Hospital Stay (HOSPITAL_COMMUNITY): Payer: Medicare Other | Admitting: Occupational Therapy

## 2013-03-21 ENCOUNTER — Inpatient Hospital Stay (HOSPITAL_COMMUNITY): Payer: Medicare Other | Admitting: Speech Pathology

## 2013-03-21 ENCOUNTER — Inpatient Hospital Stay (HOSPITAL_COMMUNITY): Payer: Medicare Other | Admitting: Physical Therapy

## 2013-03-21 NOTE — Progress Notes (Signed)
Dark purple ecchymosis noted to R eyelid and R orbital area on assessment 3/26 am. Patient denies any incident overnight where he fell or hit his eye. No swelling to area, no pain or discomfort per patient. Patient is currently prescribed Xarelto 15mg  bid. Hedy Camara

## 2013-03-21 NOTE — Progress Notes (Signed)
Patient ID: [name], male   DOB: 30-Oct-1931, 79 y.o.   MRN: [id number]  Subjective/Complaints: 79 year-old male with history of CAD, CABG who was recently evaluated in the emergency room 2 weeks ago at [facility] with weakness of his left arm and left leg noted to have acute infarct. By report he was subsequently transferred to [facility] after evaluation Plavix was added to aspirin. He did not receive TPA per report. Patient noted to have seizure and was loaded with intravenous Dilantin. Paperwork was very minimal due to workup being done at [facility]. He was later transferred back to [facility] for ongoing physical therapy and left-sided weakness secondary to stroke.  The patient has knee pain and takes tramadol at home for this.  The patient has a history of neck pain but not currently complaining. MRI in 2008 demonstrating multilevel cervical stenosis C3-C7 levels  No seizures No leg pain No SOB  Family coming in for training   Review of Systems  HENT:        Neurological: Positive for focal weakness. Negative for seizures.    Objective: Vital Signs: Blood pressure 147/83, pulse 74, temperature 98.2 F (36.8 C), temperature source Oral, resp. rate 18, height 5\' 7"  (1.702 m), weight 74.118 kg (163 lb 6.4 oz), SpO2 99.00%. No results found. Results for orders placed during the hospital encounter of 02/22/13 (from the past 72 hour(s))  BASIC METABOLIC PANEL     Status: Abnormal   Collection Time    03/19/13  7:30 AM      Result Value Range   Sodium 131 (*) 135 - 145 mEq/L   Potassium 4.1  3.5 - 5.1 mEq/L   Chloride 97  96 - 112 mEq/L   CO2 25  19 - 32 mEq/L   Glucose, Bld 85  70 - 99 mg/dL   BUN 7  6 - 23 mg/dL   Creatinine, Ser 1.02  0.50 - 1.35 mg/dL   Calcium 8.8  8.4 - 72.5 mg/dL   GFR calc non Af Amer >90  >90 mL/min   GFR calc Af Amer >90  >90 mL/min   Comment:            The eGFR has been calculated     using the CKD EPI equation.      This calculation has not been     validated in all clinical     situations.     eGFR's persistently     <90 mL/min signify     possible Chronic Kidney Disease.  CBC     Status: Abnormal   Collection Time    03/20/13  6:23 AM      Result Value Range   WBC 6.0  4.0 - 10.5 K/uL   RBC 3.62 (*) 4.22 - 5.81 MIL/uL   Hemoglobin 10.2 (*) 13.0 - 17.0 g/dL   HCT 36.6 (*) 44.0 - 34.7 %   MCV 85.1  78.0 - 100.0 fL   MCH 28.2  26.0 - 34.0 pg   MCHC 33.1  30.0 - 36.0 g/dL   RDW 42.5  95.6 - 38.7 %   Platelets 305  150 - 400 K/uL      Vitals reviewed.  Constitutional: He is oriented to person, place, and time. He appears well-developed.  Mood/affect flat HENT:  Head: Normocephalic.  Eyes: EOM are normal.  Neck: Neck supple. No thyromegaly present.  Cardiovascular: Normal rate and regular rhythm.  Pulmonary/Chest: Effort normal and breath sounds  normal. No respiratory distress.  Abdominal: Soft. Bowel sounds are normal. He exhibits no distension.  Musculoskeletal: He exhibits no edema. No calf or thigh swelling or tenderness, neg Homan's Neurological: He is alert and oriented to person, place, and time.  Follows three step commands. Impaired day to day memory. Conversationally appropriate Motor strength is 5/5 in the right deltoid, biceps, triceps, grip, hip flexor, knee extensors, ankle dorsiflexor and plantar flexor  Motor strength is 3   3/5 in the left deltoid, biceps, triceps, grip, hip flexor, 4 minus in the knee extensor, 3 minus to 3 in ankle dorsiflexor and plantar flexor  Sensation is intact to light touch  There is evidence of tactile extinction on double simultaneous stimulation  There is evidence of left-sided neglect on visual confrontation testing  There is a right gaze preference  Verbal output is sparse but no evidence of dysarthria  Patient does have decreased sensation to light touch in the left. Midline chest incision well healed    Assessment/Plan: 1.  Functional deficits secondary to R MCA distribution infarct which require 3+ hours per day of interdisciplinary therapy in a comprehensive inpatient rehab setting. Physiatrist is providing close team supervision and 24 hour management of active medical problems listed below. Physiatrist and rehab team continue to assess barriers to discharge/monitor patient progress toward functional and medical goals.  Working on family ed. SW notes that family is realizing what level of care he will need at dc FIM: FIM - Bathing Bathing Steps Patient Completed: Chest;Right Arm;Left Arm;Abdomen;Front perineal area;Buttocks;Right upper leg;Left upper leg;Right lower leg (including foot);Left lower leg (including foot) Bathing: 4: Steadying assist  FIM - Upper Body Dressing/Undressing Upper body dressing/undressing steps patient completed: Thread/unthread right sleeve of pullover shirt/dresss;Thread/unthread left sleeve of pullover shirt/dress;Put head through opening of pull over shirt/dress Upper body dressing/undressing: 4: Min-Patient completed 75 plus % of tasks FIM - Lower Body Dressing/Undressing Lower body dressing/undressing steps patient completed: Thread/unthread right pants leg;Thread/unthread left pants leg;Pull pants up/down;Don/Doff right shoe;Don/Doff left shoe Lower body dressing/undressing: 3: Mod-Patient completed 50-74% of tasks  FIM - Hotel manager Devices: Grab bar or rail for support Toileting: 0: Activity did not occur  FIM - Diplomatic Services operational officer Devices: Grab bars Toilet Transfers: 3-To toilet/BSC: Mod A (lift or lower assist);3-From toilet/BSC: Mod A (lift or lower assist)  FIM - Bed/Chair Transfer Bed/Chair Transfer Assistive Devices: Therapist, occupational: 3: Chair or W/C > Bed: Mod A (lift or lower assist);3: Bed > Chair or W/C: Mod A (lift or lower assist)  FIM - Locomotion: Wheelchair Distance: 150 Locomotion: Wheelchair: 1: Total  Assistance/staff pushes wheelchair (Pt<25%) FIM - Locomotion: Ambulation Locomotion: Ambulation Assistive Devices: Designer, industrial/product Ambulation/Gait Assistance: 2: Max assist Locomotion: Ambulation: 0: Activity did not occur  Comprehension Comprehension Mode: Auditory Comprehension: 5-Understands basic 90% of the time/requires cueing < 10% of the time  Expression Expression Mode: Verbal Expression: 5-Expresses basic 90% of the time/requires cueing < 10% of the time.  Social Interaction Social Interaction Mode: Not assessed Social Interaction: 4-Interacts appropriately 75 - 89% of the time - Needs redirection for appropriate language or to initiate interaction.  Problem Solving Problem Solving Mode: Not assessed Problem Solving: 3-Solves basic 50 - 74% of the time/requires cueing 25 - 49% of the time  Memory Memory Mode: Not assessed Memory: 4-Recognizes or recalls 75 - 89% of the time/requires cueing 10 - 24% of the time Medical Problem List and Plan:  1. Recent CVA with left-sided weakness. Will  attempt to obtain workup while at [facility]  2. DVT Prophylaxis/Anticoagulation: SCDs.Xarelto which has negative interaction with DPH 3. Mood:Lexapro 5 mg, sleeps ok 4. Neuropsych: This patient Is capable of making decisions on his/her own behalf.  -pt admits to depression/ neuropsych reports no signs of pathopsychology   -initiated lexapro 5mg  qhs  -pt's impaired memory feeds into depression  -prior notes state he was on paxil PTA 5. Pain management. Vicodin as needed for headaches  6. Seizure disorder.Dilantin  200mg  BID--stopped ,was seizure free with low DPH level. Now on keppra.     7. Hypothyroidism. Synthroid 125 mcg daily. We'll check old records for thyroid studies  8. BPH history of prostate cancer. Flomax/Casodex.   9. Cervical spinal stenosis without evidence of radiculopathy  10. Bilateral knee pain osteoarthritis , continue tramadol for pain 11.   Short segment  post tibial DVT, new Left common femoral DVT,changed back to xarelto 3/17, off Plavix, change to baby ASA     12.  Hyponatremia: continue FR, sodium 131. Recheck BMET prior to D/C 13  UTI- macrodantin completed  LOS (Days) 27 A FACE TO FACE EVALUATION WAS PERFORMED  Keyosha Tiedt E 03/21/2013, 7:09 AM

## 2013-03-21 NOTE — Progress Notes (Signed)
Occupational Therapy Session Note  Patient Details  Name: [name] MRN: [id number] Date of Birth: [birth year]  Today's Date: 03/21/2013 Time: 4098-1191 and 4782-9562 Time Calculation (min): 60 min and 30 min  Short Term Goals: Week 4:  OT Short Term Goal 1 (Week 4): Pt will complete UB dressing with supervision OT Short Term Goal 2 (Week 4): Pt will complete toilet transfer with min assist with use of grab bar OT Short Term Goal 3 (Week 4): Pt will complete shower transfer with min assist  Skilled Therapeutic Interventions/Progress Updates:    1) Pt seen for hands on family education with daughter who will be providing majority of physical assistance.  Engaged in bed mobility, shower transfer, and bathing and dressing at sit <> stand level.  Pt's daughter required mod-max verbal cues for sequencing and physical assistance with hand placement.  Pt required mod assist with bed mobility in flat bed with cues for sequencing and manual facilitation to promote weight shifting.  Daughter attempted stand pivot transfer to pt's left, but pt resistant to standing and transferring to Lt with pushing back and pt's daughter became fearful and reports she is "pulling too much".  Daughter began to doubt herself, to which the pt was not confident in her abilities.  Discussed that pt is stronger transferring to Rt, however pt will have to transfer to Lt at times.  Able to manipulate setup to allow pt to transfer to Rt with mod assist.  Stand pivot into walk-in shower with min assist with use of grab bar.  Pt completed bathing with min/steady assist when washing buttocks in standing.  Daughter able to provide physical assistance with transfers in and out of walk-in shower, however required mod verbal cues for sequencing and proper positioning for pt and herself.  Pt with increased difficulty with LB dressing this session with decreased orientation and pants and decreased sequencing with pulling pants over feet.   Encouraged pt's daughter to provide verbal cues and tactile cues when needed, but have him participate in tasks to increase his success and independence.  2) Pt seen for additional family education with daughter.  Engaged in toilet transfers, walk-in shower transfer, and bed transfer.  Stand pivot to toilet with min assist and use of grab bar.  Pt reported need to toilet, doffed pants in standing with min/steady assist for standing balance.  Pt's daughter with multiple questions regarding when to assist, encouraged daughter to have pt attempt before assisting (pulling up pants, wiping, donning socks/shoes, etc.).  Pt and daughter return demonstrated toilet transfer x2 with no further questions, pt has grab bar next to toilet at home.  Return demonstrated walk-in shower transfer, this time with no cues provided from this clinician.  Engaged in bed mobility to/from high mat with use of RW for stand pivot transfer.  Pt min assist to Rt and mod-max to Lt.  Discussed RW and stand pivot vs. squat pivot with transfer to Lt. PT arrived, plan to further problem solve transfer.  Therapy Documentation Precautions:  Precautions Precautions: Fall Precaution Comments: L inatention Restrictions Weight Bearing Restrictions: No Pain:  Pt with no c/o pain this session.  See FIM for current functional status  Therapy/Group: Individual Therapy  [name] 03/21/2013, 11:04 AM

## 2013-03-21 NOTE — Progress Notes (Signed)
Speech Language Pathology Daily Session Note  Patient Details  Name: [name] MRN: [id number] Date of Birth: 1931-12-07  Today's Date: 03/21/2013 Time: 0900-0925 Time Calculation (min): 25 min  Short Term Goals: Week 4: SLP Short Term Goal 1 (Week 4): Patient will attend to and utilize left upper extremity during functional tasks with min assist question cues. SLP Short Term Goal 2 (Week 4): Patient will attent to left environment during functional tasks with min assist verbal cues. SLP Short Term Goal 3 (Week 4): Patient will solve moderately complex problems with min assist verbal cues to self monitor and correct.   SLP Short Term Goal 4 (Week 4): Patient will select attention to propelling wheelchair to speech room with min assist vebral cues for redirection.   Skilled Therapeutic Interventions: Skilled treatment session focused on addressing family education with cognitive-linguistic goals.  Upon entering room patient presented with bruising on right eyelid and side of eye.  Patient reported on not remembering anything happening nor did chart reflect an occurrence; RN notified.  Daughter not present for family educaiton today; however, SLP left a handout regarding recommendations.  Patient educated regarding deficits in initiation, attention, awareness and overall problem solving.  Patient verbalized understanding of needs and verbalized not feeling ready for daughter to fully care for him.  Patient more appropriate with timing and attention to conversation today.       FIM:  Comprehension Comprehension Mode: Auditory Comprehension: 4-Understands basic 75 - 89% of the time/requires cueing 10 - 24% of the time Expression Expression Mode: Verbal Expression: 5-Expresses basic 90% of the time/requires cueing < 10% of the time. Social Interaction Social Interaction: 4-Interacts appropriately 75 - 89% of the time - Needs redirection for appropriate language or to initiate  interaction. Problem Solving Problem Solving: 4-Solves basic 75 - 89% of the time/requires cueing 10 - 24% of the time Memory Memory: 4-Recognizes or recalls 75 - 89% of the time/requires cueing 10 - 24% of the time FIM - Eating Eating Activity: 6: More than reasonable amount of time  Pain Pain Assessment Pain Assessment: No/denies pain  Therapy/Group: Individual Therapy  Charlane Ferretti., CCC-SLP [phone]  [name] 03/21/2013, 11:34 AM

## 2013-03-21 NOTE — Progress Notes (Signed)
Social Work Patient ID: [name], male   DOB: Mar 03, 1931, 79 y.o.   MRN: [id number] Daughter here to continue to learn pt's care for discharge, not going too well.  Team questions if discharge needs to be delayed and more education  Done so safer discharge.  Son flying in Sat to assist for a week.  Team to get back with this worker regarding recommendations.  Pt not comfortable with daughter and More fearful in transfers and mobility.

## 2013-03-21 NOTE — Plan of Care (Signed)
Problem: RH BOWEL ELIMINATION Goal: RH STG MANAGE BOWEL WITH ASSISTANCE STG Manage Bowel-Mod I (prn meds to maintain bm q2days)  Outcome: Not Met (add Reason) Patient prescribed scheduled senna, lbm 3/24 was disimpacted

## 2013-03-21 NOTE — Discharge Summary (Signed)
  Discharge summary job [id number]

## 2013-03-21 NOTE — Discharge Summary (Signed)
[name], [name]            ACCOUNT NO.:  [id number]  MEDICAL RECORD NO.:  [id number]  LOCATION:  4005                         FACILITY:  [facility]  PHYSICIAN:  Mariam Dollar, P.A.  DATE OF BIRTH:  Sep 17, 1931  DATE OF ADMISSION:  02/22/2013 DATE OF DISCHARGE:  03/26/2013                              DISCHARGE SUMMARY   DISCHARGE DIAGNOSES: 1. Recent cerebrovascular accident with left-sided weakness. 2. Xarelto for deep venous thrombosis prophylaxis. 3. Depression. 4. Pain management. 5. Seizure disorder. 6. Hypothyroidism. 7. Benign prostatic hypertrophy with history of prostate cancer. 8. Bilateral knee osteoarthritis. 9. Hyponatremia resolved.  HISTORY OF PRESENT ILLNESS:  This is a 79 year old male with history of coronary artery disease with bypass grafting, recently evaluated in the emergency room, two weeks prior at [facility] with weakness of his left arm and leg.  Noted to have acute infarct and by report was subsequently transferred to [facility].  After evaluation placed on aspirin therapy as well as Plavix.  The patient did not receive TCA per report.  He did have a history of seizure loaded with Dilantin. Paperwork was minimal due to workup being done at [facility].  He was later transferred to [facility] for ongoing care.  He was admitted for comprehensive rehab program.  PAST MEDICAL HISTORY:  See discharge diagnoses.  SOCIAL HISTORY:  Widowed, lives alone.  He has a daughter who can assist.  He was independent prior to admission.  Functional status upon admission to rehab services requiring constant moderate assist and cues for standing posture.  PHYSICAL EXAMINATION:  VITAL SIGNS:  Blood pressure 128/70, pulse 80, respirations 18.  Temperature 98.6. GENERAL:  This is an alert male.  He was oriented x3, followed simple commands. HEENT:  Pupils reactive to light. LUNGS:  Clear to auscultation. CARDIAC:   Controlled. ABDOMEN:  Soft and nontender.  Good bowel sounds.  REHABILITATION AND HOSPITAL COURSE:  The patient was admitted to inpatient rehab services with therapies initiated on a 3-hour daily basis consisting of physical therapy, occupational therapy, speech therapy, and rehabilitation nursing.  The following issues were addressed during the patient's rehabilitation stay.  Pertaining to Mr. Platts recent CVA full record is unobtainable while [facility]. The patient is presently on Xarelto.  He had been on Plavix and aspirin, but due to findings of a short segment posterior left tibial DVT, his Plavix was discontinued, placed on Xarelto as well as maintaining baby aspirin.  In light of the fact, the patient was placed on Xarelto. There was some interaction with Dilantin, the patient was initially on for seizure.  He would remain seizure-free and was changed to Keppra to avoid any interactions.  His blood pressures remained well controlled on no current antihypertensive medication.  He did have some history of benign prostatic hypertrophy with prostate cancer.  He was on low-dose Flomax.  He remained on hormone supplement for hypothyroidism.  In light of his left lower extremity DVT identified February 26, 2013, with routine follow up serial Dopplers, latest completed March 09, 2013, again showing a DVT of mid left posterior tibial vein appeared unchanged from prior study March 03, 2013.  The right common femoral vein was Patent.  The patient remained asymptomatic, and would remain on Xarelto.  The patient received weekly collaborative interdisciplinary team conferences to discuss estimated length of stay, family teaching, and any barriers to his discharge.  He was continent of bladder, bowel program established, moderate assist bathing in upper body dressing, max assist lower body dressing, max assist squat pivot transfers without upper extremity support, minimum moderate assist  with grab bar.  He continues to push with some transfers.  He extremely fearful following at times. He had regressed a little on his overall mobility moderate max assist wheelchair mobility in stand pivot transfers as well as static standing balance.  The patient required moderate assist clinically cues to select attention to turn him self-monitor correct initiation of verbalization throughout some of the sessions.  Again, it was noted the need for supervision at home.  Full family teaching was completed and plan discharge was for  March 22, 2013,  DISCHARGE MEDICATIONS: 1. Aspirin 81 mg p.o. daily. 2. Casodex 50 mg daily. 3. Lexapro 5 mg at bedtime. 4. Keppra 500 mg b.i.d. 5. Synthroid 125 mcg daily. 6. Protonix 40 mg daily. 7. Xarelto 20 mg p.o. daily. 8. Flomax 0.4 mg daily. 9. Ultram 50 mg every 6 hours as needed.  DIET:  Regular.  SPECIAL INSTRUCTIONS: 1. Followup The patient should followup with Dr. Claudette Laws at     the outpatient rehab service office April 16, 2013. 2. Dr. Sherryll Burger medical management April 03, 2013.     Mariam Dollar, P.A.     DA/MEDQ  D:  03/21/2013  T:  03/21/2013  Job:  [id number]

## 2013-03-21 NOTE — Patient Care Conference (Signed)
Inpatient RehabilitationTeam Conference and Plan of Care Update Date: 03/21/2013   Time: 10:25 Am    Patient Name: [name]      Medical Record Number: [id number]  Date of Birth: [birth year] Sex: Male         Room/Bed: 4005/4005-01 Payor Info: Payor: [employer]vertising copy[employer] MEDICARE  Plan: AARP MEDICARE COMPLETE  Product Type: *No Product type*     Admitting Diagnosis: rt cva seizures  Admit Date/Time:  02/22/2013  1:06 PM Admission Comments: No comment available   Primary Diagnosis:  CVA (cerebral infarction) Principal Problem: CVA (cerebral infarction)  Patient Active Problem List   Diagnosis Date Noted  . Acute venous embolism and thrombosis of deep vessels of proximal lower extremity 03/12/2013  . Prostate cancer   . CVA (cerebral infarction) 02/22/2013  . Spinal stenosis in cervical region 02/22/2013  . Bilateral chronic knee pain 02/22/2013  . Carotid bruit 11/15/2012  . CORONARY ATHEROSCLEROSIS NATIVE CORONARY ARTERY 01/01/2011  . MIXED HYPERLIPIDEMIA 11/26/2010  . ELEVATED BLOOD PRESSURE 11/26/2010    Expected Discharge Date: Expected Discharge Date: 03/22/13  Team Members Present: Physician leading conference: Dr. Claudette Laws Social Worker Present: Dossie Der, LCSW Nurse Present: Laural Roes, RN PT Present: [name], PT;Other (comment) Clarisse Gouge Ripa-PT) OT Present: [name], Felipa Eth, OT SLP Present: Fae Pippin, SLP Other (Discipline and Name): Charolette Child Coordinator     Current Status/Progress Goal Weekly Team Focus  Medical   no evidence of PE  cont anticoag  Family training   Bowel/Bladder   cont bowel and bladder  continent bowel/bladder  encourage pt to use bathroom   Swallow/Nutrition/ Hydration             ADL's   min assist bathing and UB dressing, mod assist LB dressing, min-mod assist squat pivot transfer without grap bar, min assist with grab bar. Hands on education with daughter has begun.  min assist overall,  downgraded UB dressing to min assist  Lt attention, functional use of LUE, transfers, activity tolerance, family education, d/c planning   Mobility   Min-mod A overall for basic transfers, w/c mobility, gait and car transfers  supervision-min A w/c level; mod A car transfer and ambulation very short distances with therapy only  attention, transfer training with daughter, standing balance, gait and LLE strength   Communication   mod assist due to cognition          Safety/Cognition/ Behavioral Observations  min-mod assist  min assist  family education    Pain   pain managed with scheduled tramadol 50 mg  pain 3 or less 1-10 scale   free of pain   Skin   blanchable reddness to buttocks  no new breakdown  free of new skin breakdown      *See Interdisciplinary Assessment and Plan and progress notes for long and short-term goals  Barriers to Discharge: Daughter having difficulty with learning care    Possible Resolutions to Barriers:  Con't family ed    Discharge Planning/Teaching Needs:  Daughter here to finish up family education, to prepare for discharge tomorrow      Team Discussion:  Daughter here to do family education-not going well.  Pt more fearful of transfers and mobility with her not used to her. Inconsistent with his function and memory issues.  Revisions to Treatment Plan:  Possible extend stay for family education-not safe yet.  Downgraded goals to min/mod due to slow progress   Continued Need for Acute Rehabilitation Level of Care: The patient  requires daily medical management by a physician with specialized training in physical medicine and rehabilitation for the following conditions: Daily direction of a multidisciplinary physical rehabilitation program to ensure safe treatment while eliciting the highest outcome that is of practical value to the patient.: Yes Daily medical management of patient stability for increased activity during participation in an intensive  rehabilitation regime.: Yes Daily analysis of laboratory values and/or radiology reports with any subsequent need for medication adjustment of medical intervention for : Neurological problems;Other  Dyani Babel, Lemar Livings 03/21/2013, 2:27 PM

## 2013-03-21 NOTE — Progress Notes (Signed)
Physical Therapy Session Note  Patient Details  Name: [name] MRN: [id number] Date of Birth: 08/25/1931  Today's Date: 03/21/2013 Time: 1130-1205 and 9562-1308  Time Calculation (min): 35 min and 48 min  Short Term Goals: Week 3:  PT Short Term Goal 1 (Week 3): Patient will perform bed mobility to L and R with mod A on flat bed PT Short Term Goal 1 - Progress (Week 3): Met PT Short Term Goal 2 (Week 3): Patient will perform bed <> w/c transfers stand or squat pivot to L and R mod A consistently PT Short Term Goal 2 - Progress (Week 3): Met PT Short Term Goal 3 (Week 3): Patient will performed w/c mobility in controlled environment x 150' wtih supervision and intermittent verbal cues to attend to L environment PT Short Term Goal 3 - Progress (Week 3): Progressing toward goal PT Short Term Goal 4 (Week 3): Patient will ambulate in controlled environment x 25' with RW and mod A  PT Short Term Goal 4 - Progress (Week 3): Progressing toward goal PT Short Term Goal 5 (Week 3): Patient will perform up and down 4 stairs with 2 rails and mod A PT Short Term Goal 5 - Progress (Week 3): Progressing toward goal Week 4:  PT Short Term Goal 1 (Week 4): = LTG of min A overall   Skilled Therapeutic Interventions/Progress Updates:   Family education with daughter with continued practice for stand pivot transfers tall bed <> w/c to R and L with therapist providing verbal cues for placement and set up of w/c.  Patient performed stand pivot w/c > bed to R with UE support on RW with daughter's mod-max A to stand from w/c and to maintain COG over BOS during pivoting and for full pivot prior to sitting on bed; once on EOB patient's feet unable to touch floor to scoot posterior on bed.  Placed stool under feet to assist with scooting.  Daughter reports they do have a step stool they can place under his feet.  Sit <> supine on flat bed with min-mod A overall with assistance to bring LLE into bed and reposition  in bed.  Requires assistance to flex bilat LE to bridge and reposition hips and shoulders.  Required mod A for rolling to R secondary to fear/anxiety of falling and min A for side > sit EOB.  Daughter attempted stand pivot bed > w/c to L but patient unable to bring COG over BOS and weight shift to R to unweight and advance LLE and returned to sitting on bed.  Performed squat pivot to w/c to L with UE support on arm rests with max A to complete pivot and to assist with scooting laterally in w/c educating daughter on head hips relationship to assist with scooting.  Will practice again in pm.  Patient and daughter may require > 1 day of family education to ensure safe D/C home.  May also require hospital bed or sliding board for increased safety with transfers.    PM session:   Continued family education and transfer training with review of real car transfer stand pivot without RW but with patient UE support on car door.  Daughter demonstrated safe w/c set up and parts management to set up for transfer and able to verbalize sequence to patient.  Pt performed stand pivot with daughter's mod-max A overall to stand from w/c, stabilize with UE support on car door and assistance and verbal cues for full pivot prior to sitting in  car seat.  Required mod A overall to maintain trunk position and place LE into and out of car.  Demonstrated again to daughter how to assist patient at pelvis with R lateral lean and weight shift to advance and place LLE or to assist with trunk and pelvic rotation when placing LE into or out of car.  Performed stand pivot back to w/c with UE support on car door with mod-max A overall to stand and fully pivot.  Discussed with patient what would help make the transfer more successful and he responded, "slowing down with the cues."  Discussed this with daughter that she give one, simple command at a time and allow patient time to process and respond.  Discussed again with patient and daughter option  of transport w/c, hospital bed or slideboard to increase safety and decrease risk for falls during transfers and minimize daughter's fall risk when carrying w/c up and down stairs; patient and daughter denied need for these items and responded that if they get home and realize they need them they will discuss it with HHPT.  Daughter was interested in purchasing bed rail that could be added to patient's bed at home for UE support and safety with rolling.  Discussed with daughter that these could be purchased at medical supply store.  Also discussed option of adding a few days on rehab for more transfer/mobility training and allow patient's son to be present when patient goes home after the weekend; patient and daughter denied need for more days of practice.  Pt reports he is ready to D/C home and daughter states that they will have friends to assist as needed upon D/C until her brother arrives.  Educated daughter that patient is not safe at this time for ambulation at home and should continue gait training with HHPT only.  Daughter does report feeling exhausted after assisting her father during therapy sessions.  Encouraged daughter that this is normal for full time care givers and explained to her that it is important for her to look into hiring caregivers to assist her with patient's care to avoid exhaustion and burn out so that she can remain physically and mentally strong to take care of him.  Daughter verbalized understanding but continued to deny need for extra days for practice.   Therapy Documentation Precautions:  Precautions Precautions: Fall Precaution Comments: L inatention Restrictions Weight Bearing Restrictions: No Pain: Pain Assessment Pain Assessment: No/denies pain Pain Score:   4 Pain Type: Chronic pain Pain Location: Back Pain Orientation: Lower Pain Descriptors: Discomfort Pain Onset: Other (Comment) (when flat on bed) Pain Intervention(s): Repositioned  See FIM for current  functional status  Therapy/Group: Individual Therapy  [name] [facility] 03/21/2013, 1:07 PM

## 2013-03-22 ENCOUNTER — Inpatient Hospital Stay (HOSPITAL_COMMUNITY): Payer: Medicare Other | Admitting: Physical Therapy

## 2013-03-22 ENCOUNTER — Inpatient Hospital Stay (HOSPITAL_COMMUNITY): Payer: Medicare Other | Admitting: Speech Pathology

## 2013-03-22 ENCOUNTER — Inpatient Hospital Stay (HOSPITAL_COMMUNITY): Payer: Medicare Other | Admitting: Occupational Therapy

## 2013-03-22 DIAGNOSIS — G8110 Spastic hemiplegia affecting unspecified side: Secondary | ICD-10-CM

## 2013-03-22 DIAGNOSIS — I6330 Cerebral infarction due to thrombosis of unspecified cerebral artery: Secondary | ICD-10-CM

## 2013-03-22 LAB — BASIC METABOLIC PANEL
CO2: 25 mEq/L (ref 19–32)
Calcium: 9 mg/dL (ref 8.4–10.5)
Creatinine, Ser: 0.53 mg/dL (ref 0.50–1.35)
Glucose, Bld: 82 mg/dL (ref 70–99)

## 2013-03-22 MED ORDER — RIVAROXABAN 20 MG PO TABS: 20.0000 mg | ORAL_TABLET | Freq: Every day | ORAL | Status: DC

## 2013-03-22 MED ORDER — ESCITALOPRAM OXALATE 5 MG PO TABS: 5.0000 mg | ORAL_TABLET | Freq: Every day | ORAL | Status: DC

## 2013-03-22 MED ORDER — ASPIRIN 81 MG PO CHEW: 81.0000 mg | CHEWABLE_TABLET | Freq: Every day | ORAL | Status: DC

## 2013-03-22 MED ORDER — BICALUTAMIDE 50 MG PO TABS: 50.0000 mg | ORAL_TABLET | Freq: Every day | ORAL | Status: AC

## 2013-03-22 MED ORDER — NITROGLYCERIN 0.4 MG SL SUBL: 0.4000 mg | SUBLINGUAL_TABLET | SUBLINGUAL | Status: AC | PRN

## 2013-03-22 MED ORDER — NIACIN ER (ANTIHYPERLIPIDEMIC) 500 MG PO TBCR: 500.0000 mg | EXTENDED_RELEASE_TABLET | Freq: Every day | ORAL | Status: AC

## 2013-03-22 MED ORDER — CALCIUM CARBONATE 1250 (500 CA) MG PO TABS: 1.0000 | ORAL_TABLET | Freq: Every day | ORAL | Status: AC

## 2013-03-22 MED ORDER — OMEPRAZOLE 20 MG PO CPDR: 20.0000 mg | DELAYED_RELEASE_CAPSULE | Freq: Two times a day (BID) | ORAL | Status: DC | PRN

## 2013-03-22 MED ORDER — ZINC 50 MG PO TABS: 50.0000 mg | ORAL_TABLET | Freq: Every day | ORAL | Status: AC

## 2013-03-22 MED ORDER — SENNOSIDES-DOCUSATE SODIUM 8.6-50 MG PO TABS: 1.0000 | ORAL_TABLET | Freq: Two times a day (BID) | ORAL | Status: AC

## 2013-03-22 MED ORDER — VITAMIN D3 25 MCG (1000 UNIT) PO TABS: 1000.0000 [IU] | ORAL_TABLET | Freq: Every day | ORAL | Status: AC

## 2013-03-22 MED ORDER — ADULT MULTIVITAMIN W/MINERALS CH: 1.0000 | ORAL_TABLET | Freq: Every day | ORAL | Status: AC

## 2013-03-22 MED ORDER — LEVETIRACETAM 500 MG PO TABS: 500.0000 mg | ORAL_TABLET | Freq: Two times a day (BID) | ORAL | Status: AC

## 2013-03-22 MED ORDER — VITAMIN C 500 MG PO TABS: 500.0000 mg | ORAL_TABLET | Freq: Every day | ORAL | Status: AC

## 2013-03-22 MED ORDER — TAMSULOSIN HCL 0.4 MG PO CAPS: 0.4000 mg | ORAL_CAPSULE | Freq: Every day | ORAL | Status: AC

## 2013-03-22 NOTE — Progress Notes (Signed)
Occupational Therapy Session Note  Patient Details  Name: [name] MRN: [id number] Date of Birth: [birth year]  Today's Date: 03/22/2013 Time: 0910-1004 Time Calculation (min): 54 min  Short Term Goals: Week 4:  OT Short Term Goal 1 (Week 4): Pt will complete UB dressing with supervision OT Short Term Goal 2 (Week 4): Pt will complete toilet transfer with min assist with use of grab bar OT Short Term Goal 3 (Week 4): Pt will complete shower transfer with min assist  Skilled Therapeutic Interventions/Progress Updates:    Pt seen for ADL retraining with focus on transfers, sit <> stand, and functional use of LUE.  Pt in bed upon arrival, completed squat/scoot pivot to Lt with mod assist and mod-max cues for initiation, sequencing, and weight shifting.  Pt requested to complete bathing at sink, therefore focus placed on increased sit <> stand and maintaining standing balance during self-care tasks.  Pt with improved LB dressing this session, with fewer cues and increased time to process cues.  Pt stood with min assist to maintain standing balance while he pulled up brief.  Toilet transfer with min assist and use of grab bars with pt completing hygiene and clothing management with cues to attempt and reassurance of safety in standing.  Orientation cues for donning shirt, as pt continues to have difficulties with donning shirt.  No family present this session.  Therapy Documentation Precautions:  Precautions Precautions: Fall Precaution Comments: L inatention Restrictions Weight Bearing Restrictions: No Pain: Pain Assessment Pain Assessment: No/denies pain Pain Score: 0-No pain  See FIM for current functional status  Therapy/Group: Individual Therapy  [name] 03/22/2013, 1:23 PM

## 2013-03-22 NOTE — Progress Notes (Signed)
Social Work Patient ID: Renea Ee, male   DOB: June 17, 1931, 79 y.o.   MRN: [id number] Met with pt to discuss concerns regarding discharge.  Last night all he wanted was to go home, so daughter and him decided to make it work. Today he feels son needs to go through family education before he goes home.  Son is not flying in until Sat evening.  Pt would need to have education Sunday and Monday am Then be discharged.  Pt is agreeable to this.  Have contacted the insurance to find out if would cover him until Monday.  Pt aware and will inform once know.

## 2013-03-22 NOTE — Progress Notes (Signed)
Social Work Patient ID: [name], male   DOB: 1931/08/24, 79 y.o.   MRN: [id number] Spoke with [facility] [employer] who reports pt is approved until Monday.  Will do family education with on and discharge Monday afternoon. Left message for [facility] [facility]-daughter and spoke with son.  Pt pleased with plan.

## 2013-03-22 NOTE — Progress Notes (Signed)
Physical Therapy Weekly Progress Note  Patient Details  Name: [name] MRN: [id number] Date of Birth: 05/01/1931  Today's Date: 03/22/2013 Time: 1100-1155 Time Calculation (min): 55 min  Patient has met 7 of 8 long term goals and is currently min A for bed mobility, mod A for basic bed <> w/c and w/c <> car transfers with stand pivot with RW or squat pivot to L and R with extra time and verbal cues, supervision w/c mobility with extra time and verbal cues to attend to L environment and obstacle negotiation on L and mod-max A for gait short distances.  Short term goals not set for week 4 due to estimated length of stay and planned D/C for 03/22/13 home with daughter assistance.  Patient' daughter has completed 2-3 days of family education and transfer training and reported that she felt comfortable with providing mod physical assistance 24/7 until her brother arrived this weekend or until a caregiver was hired.  Patient feels that his daughter is not ready to provide the necessary physical assistance and would like to continue rehabilitation and family education with his son this weekend prior to D/C home.  D/C has been extended until Monday to allow for continued transfer and mobility training with patient and family to increase safety and minimize falls risk with transfers.    Patient continues to demonstrate the following deficits: L sided weakness, impaired motor control, timing, sequencing, apraxia, L inattention, impaired postural control, dynamic balance, gait, high falls risk and therefore will continue to benefit from skilled PT intervention to enhance overall performance with balance, postural control, ability to compensate for deficits, functional use of  left upper extremity and left lower extremity, attention, awareness and coordination.  See Patient's Care Plan for progression toward long term goals.  Patient progressing toward long term goals..  Plan of care revisions: extend LOS  until Monday for further family education and mobility training.  Skilled Therapeutic Interventions/Progress Updates:   Patient received w/c and RW for home.  Performed stand pivot transfer from current w/c to new w/c with stand pivot with new RW and mod A overall for lifting assistance to stand, verbal and tactile cues to maintain COG over BOS for full pivot.  Once in w/c assessed fit and height of foot rests.  Patient performed w/c mobility in new w/c x 150' in controlled environment with supervision and extra time with intermittent verbal cues to attend and activate LLE for LE propulsion, for attention to L environment and obstacles on L and problem solving obstacle negotiation when stuck.  Continued bed <> w/c and supine <> sit training with mod A for stand pivot to mat with patient sitting before fully pivoting.  Continued to reinforce with patient his high risk for falling and the importance of pivoting fully with RW prior to sitting to minimize falls risk. Patient verbalized understanding.  Performed sit > sidelying and side > supine with min A to assist with LLE placement.  In supine performed bilat ankle/gastroc, hamstring and glute/piriformis muscle stretches and bilat LE/lower trunk rotations to L and R to increase available ROM for mobility and minimize strain on low back.  Performed active strengthening of hip IR with pillow squeezes and bridging for hip extension stretching. Performed rolling to R side and side > sit with min A overall. Performed stand pivot back to w/c with RW and mod A with patient performing full pivot prior to sitting.  Performed stand pivot w/c > toilet with grab bar and doffing of  clothing with min-mod A overall. Patient to attempt to have BM and will call nursing staff when finished. Therapy Documentation Precautions:  Precautions Precautions: Fall Precaution Comments: L inatention Restrictions Weight Bearing Restrictions: No Pain: Pain Assessment Pain Assessment:  No/denies pain Pain Score: 0-No pain Locomotion : Wheelchair Mobility Distance: 150   See FIM for current functional status  Therapy/Group: Individual Therapy  [name] [facility] 03/22/2013, 12:15 PM

## 2013-03-22 NOTE — Progress Notes (Signed)
Patient ID: [name], male   DOB: [birth year], 79 y.o.   MRN: [id number]  Subjective/Complaints: 79 year-old male with history of CAD, CABG who was recently evaluated in the emergency room 2 weeks ago at [facility] with weakness of his left arm and left leg noted to have acute infarct. By report he was subsequently transferred to [facility] after evaluation Plavix was added to aspirin. He did not receive TPA per report. Patient noted to have seizure and was loaded with intravenous Dilantin. Paperwork was very minimal due to workup being done at [facility]. He was later transferred back to [facility] for ongoing physical therapy and left-sided weakness secondary to stroke.  The patient has knee pain and takes tramadol at home for this.  The patient has a history of neck pain but not currently complaining. MRI in 2008 demonstrating multilevel cervical stenosis C3-C7 levels  No seizures No leg pain No SOB  Family coming in for training , daughter not able to safely transfer pt  Review of Systems  HENT:        Neurological: Positive for focal weakness. Negative for seizures.    Objective: Vital Signs: Blood pressure 134/82, pulse 70, temperature 98.1 F (36.7 C), temperature source Oral, resp. rate 20, height 5\' 7"  (1.702 m), weight 76 kg (167 lb 8.8 oz), SpO2 95.00%. No results found. Results for orders placed during the hospital encounter of 02/22/13 (from the past 72 hour(s))  BASIC METABOLIC PANEL     Status: Abnormal   Collection Time    03/19/13  7:30 AM      Result Value Range   Sodium 131 (*) 135 - 145 mEq/L   Potassium 4.1  3.5 - 5.1 mEq/L   Chloride 97  96 - 112 mEq/L   CO2 25  19 - 32 mEq/L   Glucose, Bld 85  70 - 99 mg/dL   BUN 7  6 - 23 mg/dL   Creatinine, Ser 7.82  0.50 - 1.35 mg/dL   Calcium 8.8  8.4 - 95.6 mg/dL   GFR calc non Af Amer >90  >90 mL/min   GFR calc Af Amer >90  >90 mL/min   Comment:            The eGFR has been  calculated     using the CKD EPI equation.     This calculation has not been     validated in all clinical     situations.     eGFR's persistently     <90 mL/min signify     possible Chronic Kidney Disease.  CBC     Status: Abnormal   Collection Time    03/20/13  6:23 AM      Result Value Range   WBC 6.0  4.0 - 10.5 K/uL   RBC 3.62 (*) 4.22 - 5.81 MIL/uL   Hemoglobin 10.2 (*) 13.0 - 17.0 g/dL   HCT 21.3 (*) 08.6 - 57.8 %   MCV 85.1  78.0 - 100.0 fL   MCH 28.2  26.0 - 34.0 pg   MCHC 33.1  30.0 - 36.0 g/dL   RDW 46.9  62.9 - 52.8 %   Platelets 305  150 - 400 K/uL      Vitals reviewed.  Constitutional: He is oriented to person, place, and time. He appears well-developed.  Mood/affect flat HENT:  Head: Normocephalic.  Eyes: EOM are normal.  Neck: Neck supple. No thyromegaly present.  Cardiovascular: Normal rate and regular rhythm.  Pulmonary/Chest: Effort normal and breath sounds normal. No respiratory distress.  Abdominal: Soft. Bowel sounds are normal. He exhibits no distension.  Musculoskeletal: He exhibits no edema. No calf or thigh swelling or tenderness, neg Homan's Neurological: He is alert and oriented to person, place, and time.  Follows three step commands. Impaired day to day memory. Conversationally appropriate Motor strength is 5/5 in the right deltoid, biceps, triceps, grip, hip flexor, knee extensors, ankle dorsiflexor and plantar flexor  Motor strength is 3   3/5 in the left deltoid, biceps, triceps, grip, hip flexor, 4 minus in the knee extensor, 3 minus to 3 in ankle dorsiflexor and plantar flexor  Sensation is intact to light touch  There is evidence of tactile extinction on double simultaneous stimulation  There is evidence of left-sided neglect on visual confrontation testing  There is a right gaze preference  Verbal output is sparse but no evidence of dysarthria  Patient does have decreased sensation to light touch in the left. Midline chest incision well  healed    Assessment/Plan: 1. Functional deficits secondary to R MCA distribution infarct which require 3+ hours per day of interdisciplinary therapy in a comprehensive inpatient rehab setting. Physiatrist is providing close team supervision and 24 hour management of active medical problems listed below. Physiatrist and rehab team continue to assess barriers to discharge/monitor patient progress toward functional and medical goals.  Working on family ed. Daughter is unable to safely move pt, son will be in town soon. Will delay D/C 1-2 days FIM: FIM - Bathing Bathing Steps Patient Completed: Chest;Right Arm;Left Arm;Abdomen;Front perineal area;Buttocks;Right upper leg;Left upper leg;Right lower leg (including foot);Left lower leg (including foot) Bathing: 4: Steadying assist  FIM - Upper Body Dressing/Undressing Upper body dressing/undressing steps patient completed: Thread/unthread right sleeve of pullover shirt/dresss;Thread/unthread left sleeve of pullover shirt/dress;Put head through opening of pull over shirt/dress Upper body dressing/undressing: 4: Min-Patient completed 75 plus % of tasks FIM - Lower Body Dressing/Undressing Lower body dressing/undressing steps patient completed: Thread/unthread right underwear leg;Thread/unthread left underwear leg;Thread/unthread right pants leg;Thread/unthread left pants leg;Don/Doff right shoe Lower body dressing/undressing: 3: Mod-Patient completed 50-74% of tasks  FIM - Toileting Toileting steps completed by patient: Adjust clothing prior to toileting;Performs perineal hygiene Toileting Assistive Devices: Grab bar or rail for support Toileting: 3: Mod-Patient completed 2 of 3 steps  FIM - Diplomatic Services operational officer Devices: Elevated toilet seat;Grab bars Toilet Transfers: 4-To toilet/BSC: Min A (steadying Pt. > 75%);4-From toilet/BSC: Min A (steadying Pt. > 75%)  FIM - Banker  Devices: Walker;Arm rests Bed/Chair Transfer: 3: Supine > Sit: Mod A (lifting assist/Pt. 50-74%/lift 2 legs;4: Sit > Supine: Min A (steadying pt. > 75%/lift 1 leg);2: Bed > Chair or W/C: Max A (lift and lower assist);3: Bed > Chair or W/C: Mod A (lift or lower assist);3: Chair or W/C > Bed: Mod A (lift or lower assist);2: Chair or W/C > Bed: Max A (lift and lower assist)  FIM - Locomotion: Wheelchair Distance: 150 Locomotion: Wheelchair: 1: Total Assistance/staff pushes wheelchair (Pt<25%) FIM - Locomotion: Ambulation Locomotion: Ambulation Assistive Devices: Designer, industrial/product Ambulation/Gait Assistance: 2: Max assist Locomotion: Ambulation: 0: Activity did not occur  Comprehension Comprehension Mode: Auditory Comprehension: 4-Understands basic 75 - 89% of the time/requires cueing 10 - 24% of the time  Expression Expression Mode: Verbal Expression: 5-Expresses basic 90% of the time/requires cueing < 10% of the time.  Social Interaction Social Interaction Mode: Not assessed Social Interaction: 4-Interacts appropriately 75 - 89%  of the time - Needs redirection for appropriate language or to initiate interaction.  Problem Solving Problem Solving Mode: Not assessed Problem Solving: 4-Solves basic 75 - 89% of the time/requires cueing 10 - 24% of the time  Memory Memory Mode: Not assessed Memory: 4-Recognizes or recalls 75 - 89% of the time/requires cueing 10 - 24% of the time Medical Problem List and Plan:  1. Recent CVA with left-sided weakness. Will attempt to obtain workup while at [facility]  2. DVT Prophylaxis/Anticoagulation: SCDs.Xarelto which has negative interaction with DPH 3. Mood:Lexapro 5 mg, sleeps ok 4. Neuropsych: This patient Is capable of making decisions on his/her own behalf.  -pt admits to depression/ neuropsych reports no signs of pathopsychology   -initiated lexapro 5mg  qhs  -pt's impaired memory feeds into depression  -prior notes state he was on paxil  PTA 5. Pain management. Vicodin as needed for headaches  6. Seizure disorder.Dilantin  200mg  BID--stopped ,was seizure free with low DPH level. Now on keppra.     7. Hypothyroidism. Synthroid 125 mcg daily. We'll check old records for thyroid studies  8. BPH history of prostate cancer. Flomax/Casodex.   9. Cervical spinal stenosis without evidence of radiculopathy  10. Bilateral knee pain osteoarthritis , continue tramadol for pain 11.   Short segment post tibial DVT, new Left common femoral DVT,changed back to xarelto 3/17, off Plavix, change to baby ASA     12.  Hyponatremia: continue FR, sodium 131. Recheck BMET prior to D/C 13  UTI- macrodantin completed  LOS (Days) 28 A FACE TO FACE EVALUATION WAS PERFORMED  KIRSTEINS,ANDREW E 03/22/2013, 7:14 AM

## 2013-03-22 NOTE — Progress Notes (Signed)
Social Work Patient ID: [name], male   DOB: 29-Aug-1931, 79 y.o.   MRN: [id number] Case has been given to the medical director of [employer] and she is deciding if pt meets criteria to stay in hospital. Awaitng return call from Insurance case Manager-Zelda.

## 2013-03-22 NOTE — Progress Notes (Signed)
Speech Language Pathology Daily Session Note  Patient Details  Name: [name] MRN: [id number] Date of Birth: 1931-02-28  Today's Date: 03/22/2013 Time: 1500-1530 Time Calculation (min): 30 min  Short Term Goals: Week 4: SLP Short Term Goal 1 (Week 4): Patient will attend to and utilize left upper extremity during functional tasks with min assist question cues. SLP Short Term Goal 2 (Week 4): Patient will attent to left environment during functional tasks with min assist verbal cues. SLP Short Term Goal 3 (Week 4): Patient will solve moderately complex problems with min assist verbal cues to self monitor and correct.   SLP Short Term Goal 4 (Week 4): Patient will select attention to propelling wheelchair to speech room with min assist vebral cues for redirection.   Skilled Therapeutic Interventions: Skilled treatment session focused on addressing cognitive goals during self care task.  SLP facilitated session with mod assist verbal cues and min physical assist during toilet transfer.  Daughter not present for family educaiton and stay extended to address education with son after his arrval this weekend; plan for fmaily education on Monday.    FIM:  Comprehension Comprehension Mode: Auditory Comprehension: 4-Understands basic 75 - 89% of the time/requires cueing 10 - 24% of the time Expression Expression Mode: Verbal Expression: 5-Expresses complex 90% of the time/cues < 10% of the time Social Interaction Social Interaction: 4-Interacts appropriately 75 - 89% of the time - Needs redirection for appropriate language or to initiate interaction. Problem Solving Problem Solving: 3-Solves basic 50 - 74% of the time/requires cueing 25 - 49% of the time Memory Memory: 4-Recognizes or recalls 75 - 89% of the time/requires cueing 10 - 24% of the time  Pain Pain Assessment Pain Assessment: No/denies pain Pain Score: 0-No pain  Therapy/Group: Individual Therapy  Charlane Ferretti.,  CCC-SLP [phone]  [name] 03/22/2013, 4:17 PM

## 2013-03-23 ENCOUNTER — Inpatient Hospital Stay (HOSPITAL_COMMUNITY): Payer: Medicare Other | Admitting: Occupational Therapy

## 2013-03-23 ENCOUNTER — Inpatient Hospital Stay (HOSPITAL_COMMUNITY): Payer: Medicare Other | Admitting: Speech Pathology

## 2013-03-23 ENCOUNTER — Inpatient Hospital Stay (HOSPITAL_COMMUNITY): Payer: Medicare Other | Admitting: Physical Therapy

## 2013-03-23 DIAGNOSIS — I6330 Cerebral infarction due to thrombosis of unspecified cerebral artery: Secondary | ICD-10-CM

## 2013-03-23 DIAGNOSIS — G8110 Spastic hemiplegia affecting unspecified side: Secondary | ICD-10-CM

## 2013-03-23 LAB — CBC
Hemoglobin: 10.3 g/dL — ABNORMAL LOW (ref 13.0–17.0)
MCH: 29.2 pg (ref 26.0–34.0)
MCHC: 33.6 g/dL (ref 30.0–36.0)
Platelets: 310 10*3/uL (ref 150–400)
RBC: 3.53 MIL/uL — ABNORMAL LOW (ref 4.22–5.81)

## 2013-03-23 MED ORDER — WHITE PETROLATUM GEL
Status: AC
  Filled 2013-03-23: qty 5

## 2013-03-23 NOTE — Plan of Care (Signed)
Problem: RH BOWEL ELIMINATION Goal: RH STG MANAGE BOWEL WITH ASSISTANCE STG Manage Bowel-Mod I (prn meds to maintain bm q2days)  Outcome: Progressing Using senna

## 2013-03-23 NOTE — Progress Notes (Addendum)
Speech Language Pathology Daily Session Note  Patient Details  Name: [name] MRN: [id number] Date of Birth: 01/10/1931  Today's Date: 03/23/2013 Time: 3086-5784 Time Calculation (min): 40 min  Short Term Goals: Week 4: SLP Short Term Goal 1 (Week 4): Patient will attend to and utilize left upper extremity during functional tasks with min assist question cues. SLP Short Term Goal 2 (Week 4): Patient will attent to left environment during functional tasks with min assist verbal cues. SLP Short Term Goal 3 (Week 4): Patient will solve moderately complex problems with min assist verbal cues to self monitor and correct.   SLP Short Term Goal 4 (Week 4): Patient will select attention to propelling wheelchair to speech room with min assist vebral cues for redirection.   Skilled Therapeutic Interventions: Skilled treatment session focused on addressing cognitive goals during self-care tasks.  No family present for session.  SLP facilitated session with min assist verbal cues and min physical assist during toilet transfer.  SLP also facilitated session with mod assist cues to problem solve how to determine which channel a TV show was being aired on.  Patient demonstrated decreased mental flexibility afte attempting home channel number and not locating it.    FIM:  Comprehension Comprehension Mode: Auditory Comprehension: 4-Understands basic 75 - 89% of the time/requires cueing 10 - 24% of the time Expression Expression Mode: Verbal Expression: 5-Expresses complex 90% of the time/cues < 10% of the time Social Interaction Social Interaction: 4-Interacts appropriately 75 - 89% of the time - Needs redirection for appropriate language or to initiate interaction. Problem Solving Problem Solving: 3-Solves basic 50 - 74% of the time/requires cueing 25 - 49% of the time Memory Memory: 4-Recognizes or recalls 75 - 89% of the time/requires cueing 10 - 24% of the time FIM - Eating Eating Activity:  7: Complete independence:no helper  Pain Pain Assessment Pain Assessment: No/denies pain  Therapy/Group: Individual Therapy  Charlane Ferretti., CCC-SLP [phone]  [name] 03/23/2013, 4:03 PM

## 2013-03-23 NOTE — Progress Notes (Signed)
Patient ID: [name], male   DOB: 06/02/1931, 79 y.o.   MRN: [id number]  Subjective/Complaints: 79 year-old male with history of CAD, CABG who was recently evaluated in the emergency room 2 weeks ago at [facility] with weakness of his left arm and left leg noted to have acute infarct. By report he was subsequently transferred to [facility] after evaluation Plavix was added to aspirin. He did not receive TPA per report. Patient noted to have seizure and was loaded with intravenous Dilantin. Paperwork was very minimal due to workup being done at [facility]. He was later transferred back to [facility] for ongoing physical therapy and left-sided weakness secondary to stroke.  The patient has knee pain and takes tramadol at home for this.  The patient has a history of neck pain but not currently complaining. MRI in 2008 demonstrating multilevel cervical stenosis C3-C7 levels  No seizures No leg pain No SOB  Family coming in for training , daughter not able to safely transfer pt  Review of Systems  HENT:        Neurological: Positive for focal weakness. Negative for seizures.    Objective: Vital Signs: Blood pressure 143/76, pulse 65, temperature 97.5 F (36.4 C), temperature source Oral, resp. rate 18, height 5\' 7"  (1.702 m), weight 76 kg (167 lb 8.8 oz), SpO2 94.00%. No results found. Results for orders placed during the hospital encounter of 02/22/13 (from the past 72 hour(s))  BASIC METABOLIC PANEL     Status: Abnormal   Collection Time    03/22/13  6:02 AM      Result Value Range   Sodium 130 (*) 135 - 145 mEq/L   Potassium 4.1  3.5 - 5.1 mEq/L   Chloride 95 (*) 96 - 112 mEq/L   CO2 25  19 - 32 mEq/L   Glucose, Bld 82  70 - 99 mg/dL   BUN 7  6 - 23 mg/dL   Creatinine, Ser 4.09  0.50 - 1.35 mg/dL   Calcium 9.0  8.4 - 81.1 mg/dL   GFR calc non Af Amer >90  >90 mL/min   GFR calc Af Amer >90  >90 mL/min   Comment:            The eGFR has been  calculated     using the CKD EPI equation.     This calculation has not been     validated in all clinical     situations.     eGFR's persistently     <90 mL/min signify     possible Chronic Kidney Disease.      Vitals reviewed.  Constitutional: He is oriented to person, place, and time. He appears well-developed.  Mood/affect flat HENT:  Head: Normocephalic.  Eyes: EOM are normal.  Neck: Neck supple. No thyromegaly present.  Cardiovascular: Normal rate and regular rhythm.  Pulmonary/Chest: Effort normal and breath sounds normal. No respiratory distress.  Abdominal: Soft. Bowel sounds are normal. He exhibits no distension.  Musculoskeletal: He exhibits no edema. No calf or thigh swelling or tenderness, neg Homan's Neurological: He is alert and oriented to person, place, and time.  Follows three step commands. Impaired day to day memory. Conversationally appropriate Motor strength is 5/5 in the right deltoid, biceps, triceps, grip, hip flexor, knee extensors, ankle dorsiflexor and plantar flexor  Motor strength is 3   3/5 in the left deltoid, biceps, triceps, grip, hip flexor, 4 minus in the knee extensor, 3 minus to 3 in  ankle dorsiflexor and plantar flexor  Sensation is intact to light touch  There is evidence of tactile extinction on double simultaneous stimulation  There is evidence of left-sided neglect on visual confrontation testing  There is a right gaze preference  Verbal output is sparse but no evidence of dysarthria  Patient does have decreased sensation to light touch in the left. Midline chest incision well healed    Assessment/Plan: 1. Functional deficits secondary to R MCA distribution infarct which require 3+ hours per day of interdisciplinary therapy in a comprehensive inpatient rehab setting. Physiatrist is providing close team supervision and 24 hour management of active medical problems listed below. Physiatrist and rehab team continue to assess barriers to  discharge/monitor patient progress toward functional and medical goals.  Working on family ed. Daughter is unable to safely move pt, son will be in town tomorrow and will need 1-2 days to train.FIM: FIM - Bathing Bathing Steps Patient Completed: Chest;Right Arm;Left Arm;Abdomen;Front perineal area;Buttocks;Right upper leg;Left upper leg;Right lower leg (including foot);Left lower leg (including foot) Bathing: 4: Steadying assist  FIM - Upper Body Dressing/Undressing Upper body dressing/undressing steps patient completed: Thread/unthread right sleeve of pullover shirt/dresss;Thread/unthread left sleeve of pullover shirt/dress;Put head through opening of pull over shirt/dress Upper body dressing/undressing: 4: Min-Patient completed 75 plus % of tasks FIM - Lower Body Dressing/Undressing Lower body dressing/undressing steps patient completed: Thread/unthread right underwear leg;Thread/unthread left underwear leg;Pull underwear up/down;Thread/unthread right pants leg;Thread/unthread left pants leg;Don/Doff right shoe;Don/Doff left shoe Lower body dressing/undressing: 4: Min-Patient completed 75 plus % of tasks  FIM - Toileting Toileting steps completed by patient: Adjust clothing prior to toileting;Performs perineal hygiene;Adjust clothing after toileting Toileting Assistive Devices: Grab bar or rail for support Toileting: 4: Steadying assist  FIM - Diplomatic Services operational officer Devices: Best boy Transfers: 3-To toilet/BSC: Mod A (lift or lower assist);3-From toilet/BSC: Mod A (lift or lower assist)  FIM - Banker Devices: Walker;Arm rests Bed/Chair Transfer: 4: Supine > Sit: Min A (steadying Pt. > 75%/lift 1 leg);4: Sit > Supine: Min A (steadying pt. > 75%/lift 1 leg);3: Chair or W/C > Bed: Mod A (lift or lower assist);3: Bed > Chair or W/C: Mod A (lift or lower assist)  FIM - Locomotion: Wheelchair Distance:  150 Locomotion: Wheelchair: 5: Travels 150 ft or more: maneuvers on rugs and over door sills with supervision, cueing or coaxing FIM - Locomotion: Ambulation Locomotion: Ambulation Assistive Devices: Designer, industrial/product Ambulation/Gait Assistance: 2: Max assist Locomotion: Ambulation: 0: Activity did not occur  Comprehension Comprehension Mode: Auditory Comprehension: 4-Understands basic 75 - 89% of the time/requires cueing 10 - 24% of the time  Expression Expression Mode: Verbal Expression: 5-Expresses basic 90% of the time/requires cueing < 10% of the time.  Social Interaction Social Interaction Mode: Not assessed Social Interaction: 4-Interacts appropriately 75 - 89% of the time - Needs redirection for appropriate language or to initiate interaction.  Problem Solving Problem Solving Mode: Not assessed Problem Solving: 3-Solves basic 50 - 74% of the time/requires cueing 25 - 49% of the time  Memory Memory Mode: Not assessed Memory: 4-Recognizes or recalls 75 - 89% of the time/requires cueing 10 - 24% of the time Medical Problem List and Plan:  1. Recent CVA with left-sided weakness. Will attempt to obtain workup while at [facility]  2. DVT Prophylaxis/Anticoagulation: SCDs.Xarelto which has negative interaction with DPH 3. Mood:Lexapro 5 mg, sleeps ok 4. Neuropsych: This patient Is capable of making decisions on his/her own behalf.  -  pt admits to depression/ neuropsych reports no signs of pathopsychology   -initiated lexapro 5mg  qhs  -pt's impaired memory feeds into depression  -prior notes state he was on paxil PTA 5. Pain management. Vicodin as needed for headaches  6. Seizure disorder.Dilantin  200mg  BID--stopped ,was seizure free with low DPH level. Now on keppra.     7. Hypothyroidism. Synthroid 125 mcg daily. We'll check old records for thyroid studies  8. BPH history of prostate cancer. Flomax/Casodex.   9. Cervical spinal stenosis without evidence of radiculopathy   10. Bilateral knee pain osteoarthritis , continue tramadol for pain 11.   Short segment post tibial DVT, new Left common femoral DVT,changed back to xarelto 3/17, off Plavix, change to baby ASA     12.  Hyponatremia: continue FR, sodium 130. Recheck BMET prior to D/C   LOS (Days) 29 A FACE TO FACE EVALUATION WAS PERFORMED  KIRSTEINS,ANDREW E 03/23/2013, 6:41 AM

## 2013-03-23 NOTE — Progress Notes (Signed)
Occupational Therapy Session Note  Patient Details  Name: [name] MRN: [id number] Date of Birth: March 14, 1931  Today's Date: 03/23/2013 Time: 1330-1400 Time Calculation (min): 30 min  Short Term Goals: Week 4:  OT Short Term Goal 1 (Week 4): Pt will complete UB dressing with supervision OT Short Term Goal 2 (Week 4): Pt will complete toilet transfer with min assist with use of grab bar OT Short Term Goal 3 (Week 4): Pt will complete shower transfer with min assist  Skilled Therapeutic Interventions/Progress Updates:  Upon arrival, patient seated in w/c next to bed and noted significant lateral lean to the right.  When posture was brought to patient's attention, he was able to sit upright.  Engaged in anterior weight shifts to pick up items off the floor with RUE then with LUE without hesitation and placed/tossed paper towel and tissues in the trash can using LUE. Stand step transfer to patient's right with max assist secondary to patient decided to sit before it was safe to sit.  Lateral scoots with focus on patient shifting weight anteriorly then keeping the weight shifted anteriorly to un-weight his bottom during the transitional movement. Also focused on bed mobility and attention to left side.  Therapy Documentation Precautions:  Precautions Precautions: Fall Precaution Comments: L inatention Restrictions Weight Bearing Restrictions: No Pain: Denies pain except during transition from sit EOB to supine patient reports back pain.  Change in technique, repositioned, and rest Other Treatments: Treatments Neuromuscular Facilitation: Left;Upper Extremity;Activity to increase coordination;Forced use;Lower Extremity;Activity to increase motor control;Activity to increase timing and sequencing;Activity to increase lateral weight shifting;Activity to increase anterior-posterior weight shifting;Activity to increase sustained activation  Therapy/Group: Individual Therapy  Miyuki Rzasa,  Clemencia Helzer 03/23/2013, 3:38 PM

## 2013-03-23 NOTE — Progress Notes (Signed)
Social Work Patient ID: [name], male   DOB: 1931/12/06, 79 y.o.   MRN: [id number] Met with pt to inform have not heard from his daughter, he reports she has called him.  Plan to come in Sun and Monday both she and brother for Therapies.  Discharge Monday after am therapies.  Have left daughter three messages guess see on Monday.

## 2013-03-23 NOTE — Progress Notes (Signed)
Occupational Therapy Session Note  Patient Details  Name: [name] MRN: [id number] Date of Birth: 27-Oct-1931  Today's Date: 03/23/2013 Time: 0830-0930 Time Calculation (min): 60 min  Short Term Goals: Week 4:  OT Short Term Goal 1 (Week 4): Pt will complete UB dressing with supervision OT Short Term Goal 2 (Week 4): Pt will complete toilet transfer with min assist with use of grab bar OT Short Term Goal 3 (Week 4): Pt will complete shower transfer with min assist  Skilled Therapeutic Interventions/Progress Updates:  Pt seem for ADLs this AM with focus on integrating functional use of LUE and functional mobility. Pt requires verbal cues for placement of L foot during functional transfers. Pt fearful of leaning anteriorly during bed->chair transfer impacting amount of assist required. Pt requires several short vc's during functional transfers and mod assist. Pt complete sit to stand transfer from w/c to sink with min assist then requires min assist to steadying assist to maintain standing balance during LB self-care tasks. Pt required rest breaks during therapy session secondary to fatugue. Pt informed therapist that daughter would be present this afternoon. Therapist to continue education and practice with functional transfers.     Therapy Documentation Precautions:  Precautions Precautions: Fall Precaution Comments: L inatention Restrictions Weight Bearing Restrictions: No General:   Vital Signs: Therapy Vitals Temp: 97.5 F (36.4 C) Temp src: Oral Pulse Rate: 65 Resp: 18 BP: 143/76 mmHg Patient Position, if appropriate: Lying Oxygen Therapy SpO2: 94 % O2 Device: None (Room air)  Pain: No c/o pain at this time.   See FIM for current functional status  Therapy/Group: Individual Therapy  Daneil Dan 03/23/2013, 9:44 AM

## 2013-03-23 NOTE — Progress Notes (Signed)
Social Work Patient ID: [name], male   DOB: 12-Feb-1931, 79 y.o.   MRN: [id number] Spoke with Geraldine Contras Dee-daughter to inform education schedule Sun at 9:00 and Monday at 10:00.  She and brother will be here and aware of the plan on Monday. After education and instructions gone over will be ready for discharge Monday afternoon.

## 2013-03-23 NOTE — Progress Notes (Signed)
Occupational Therapy Session Note  Patient Details  Name: Jaylan Hinojosa MRN: [id number] Date of Birth: 1931-02-23  Today's Date: 03/23/2013 Time: 1400-1445 Time Calculation (min): 45 min  Short Term Goals: Week 4:  OT Short Term Goal 1 (Week 4): Pt will complete UB dressing with supervision OT Short Term Goal 2 (Week 4): Pt will complete toilet transfer with min assist with use of grab bar OT Short Term Goal 3 (Week 4): Pt will complete shower transfer with min assist  Skilled Therapeutic Interventions/Progress Updates:    Pt seen for 1:1 OT with focus on transfers, weight shifting, and LUE functional use. Pt in bed upon arrival, squat pivot transfer to Lt with mod assist and cues for weight shifting forward to assist in transfer.  Engaged in [facility] with grasp and release of pegs with mod cues for sustained attention in moderately distracting environment.  Pt required increased time to grasp pegs secondary to impaired attention and decreased grasp.  Engaged in lateral scoots to increase forward weight shifts to assist with transfers.  Therapy Documentation Precautions:  Precautions Precautions: Fall Precaution Comments: L inatention Restrictions Weight Bearing Restrictions: No Pain: Pain Assessment Pain Assessment: 0-10 Pain Score:   3 Pain Location: Knee Pain Orientation: Left Pain Onset: On-going Pain Intervention(s): Medication (See eMAR)  See FIM for current functional status  Therapy/Group: Individual Therapy  [name] 03/23/2013, 3:06 PM

## 2013-03-23 NOTE — Progress Notes (Signed)
Physical Therapy Session Note  Patient Details  Name: [name] MRN: [id number] Date of Birth: January 28, 1931  Today's Date: 03/23/2013 Time: 4098-1191 Time Calculation (min): 26 min  Short Term Goals: Week 3:  PT Short Term Goal 1 (Week 3): Patient will perform bed mobility to L and R with mod A on flat bed PT Short Term Goal 1 - Progress (Week 3): Met PT Short Term Goal 2 (Week 3): Patient will perform bed <> w/c transfers stand or squat pivot to L and R mod A consistently PT Short Term Goal 2 - Progress (Week 3): Met PT Short Term Goal 3 (Week 3): Patient will performed w/c mobility in controlled environment x 150' wtih supervision and intermittent verbal cues to attend to L environment PT Short Term Goal 3 - Progress (Week 3): Progressing toward goal PT Short Term Goal 4 (Week 3): Patient will ambulate in controlled environment x 25' with RW and mod A  PT Short Term Goal 4 - Progress (Week 3): Progressing toward goal PT Short Term Goal 5 (Week 3): Patient will perform up and down 4 stairs with 2 rails and mod A PT Short Term Goal 5 - Progress (Week 3): Progressing toward goal Week 4:  PT Short Term Goal 1 (Week 4): = LTG of min A overall   Therapy Documentation Precautions:  Precautions Precautions: Fall Precaution Comments: L inatention Restrictions Weight Bearing Restrictions: No Pain: Pain Assessment Pain Assessment: No/denies pain Locomotion :  Performed stair negotiation training x 2 reps up and down 3 short stairs with bilat UE support on rails with step to sequence ascending with LLE and descending with LLE to focus on R lateral weight shifting, full LLE clearance and advancement with mod A for advancement of LUE and full anterior translation of COG over stance LE.  At top of stairs patient able to pivot fully with min A; at bottom of stairs patient still required mod A and total verbal cues for safety and sequencing of full pivot prior to sitting.  Performed sit <> stand  from w/c multiple times with patient initiating proper placement of bilat UE and LLE; still requires mod lifting assistance.    Other Treatments: Treatments Neuromuscular Facilitation: Left;Upper Extremity;Activity to increase coordination;Forced use;Lower Extremity;Activity to increase motor control;Activity to increase timing and sequencing;Activity to increase lateral weight shifting;Activity to increase anterior-posterior weight shifting;Activity to increase sustained activation during static standing with UE support on RW during LUE reaching forwards, up and across midline for horseshoes and use of LUE to toss horseshoes to target to facilitate forced use and sustained activation of LUE, for trunk elongation and anterior rotation of L pelvis for forward weight shift.  Required min A for task with minimal posterior or L lateral lean.  Demonstrated improved LLE extension activation.   See FIM for current functional status  Therapy/Group: Individual Therapy  [name] 03/23/2013, 12:20 PM

## 2013-03-24 ENCOUNTER — Inpatient Hospital Stay (HOSPITAL_COMMUNITY): Payer: Medicare Other | Admitting: *Deleted

## 2013-03-24 DIAGNOSIS — M25562 Pain in left knee: Secondary | ICD-10-CM

## 2013-03-24 MED ORDER — INDOMETHACIN 25 MG PO CAPS
25.0000 mg | ORAL_CAPSULE | Freq: Three times a day (TID) | ORAL | Status: DC
  Administered 2013-03-24 – 2013-03-26 (×7): 25 mg via ORAL
  Filled 2013-03-24 (×9): qty 1

## 2013-03-24 NOTE — Progress Notes (Signed)
Patient ID: [name], male   DOB: June 25, 1931, 79 y.o.   MRN: [id number] [name] is a 79 y.o. male Apr 01, 1931 [id number]  Subjective: No new complaints. No new problems. Slept well. Feeling OK. C/o L knee pain x days  Objective: Vital signs in last 24 hours: Temp:  [97.3 F (36.3 C)-97.8 F (36.6 C)] 97.8 F (36.6 C) (03/29 0600) Pulse Rate:  [78-88] 78 (03/29 0600) Resp:  [18] 18 (03/29 0600) BP: (147-152)/(86-87) 152/86 mmHg (03/29 0600) SpO2:  [98 %-100 %] 98 % (03/29 0600) Weight change:  Last BM Date: 03/22/13  Intake/Output from previous day: 03/28 0701 - 03/29 0700 In: 480 [P.O.:480] Out: 0  Last cbgs: CBG (last 3)  No results found for this basename: GLUCAP,  in the last 72 hours   Physical Exam General: No apparent distress    HEENT: moist mucosa Lungs: Normal effort. Lungs clear to auscultation, no crackles or wheezes. Cardiovascular: Regular rate and rhythm, no edema Musculoskeletal: L knee is tender, effusion present. No redness. Warm. Neurological: No new neurological deficits Wounds: N/A    Skin: clear Alert, cooperative   Lab Results: BMET    Component Value Date/Time   NA 130* 03/22/2013 0602   K 4.1 03/22/2013 0602   CL 95* 03/22/2013 0602   CO2 25 03/22/2013 0602   GLUCOSE 82 03/22/2013 0602   BUN 7 03/22/2013 0602   CREATININE 0.53 03/22/2013 0602   CALCIUM 9.0 03/22/2013 0602   GFRNONAA >90 03/22/2013 0602   GFRAA >90 03/22/2013 0602   CBC    Component Value Date/Time   WBC 6.9 03/23/2013 0625   RBC 3.53* 03/23/2013 0625   HGB 10.3* 03/23/2013 0625   HCT 30.7* 03/23/2013 0625   PLT 310 03/23/2013 0625   MCV 87.0 03/23/2013 0625   MCH 29.2 03/23/2013 0625   MCHC 33.6 03/23/2013 0625   RDW 15.3 03/23/2013 0625   LYMPHSABS 2.3 02/23/2013 0830   MONOABS 0.7 02/23/2013 0830   EOSABS 0.1 02/23/2013 0830   BASOSABS 0.0 02/23/2013 0830    Studies/Results: No results found.  Medications: I have reviewed the patient's current  medications.  Assessment/Plan:  1. Recent CVA with left-sided weakness. Will attempt to obtain workup while at [facility]  2. DVT Prophylaxis/Anticoagulation: SCDs.Xarelto which has negative interaction with DPH  3. Mood:Lexapro 5 mg, sleeps ok  4. Neuropsych: This patient Is capable of making decisions on his/her own behalf.  -pt admits to depression/ neuropsych reports no signs of pathopsychology  -initiated lexapro 5mg  qhs  -pt's impaired memory feeds into depression  -prior notes state he was on paxil PTA  5. Pain management. Vicodin as needed for headaches  6. Seizure disorder.Dilantin 200mg  BID--stopped ,was seizure free with low DPH level. Now on keppra.  7. Hypothyroidism. Synthroid 125 mcg daily. We'll check old records for thyroid studies  8. BPH history of prostate cancer. Flomax/Casodex.  9. Cervical spinal stenosis without evidence of radiculopathy  10. Bilateral knee pain osteoarthritis L>>R. He got tramadol for pain - did not help. Start Indocin w/caution x3d. R/o gout 11. Short segment post tibial DVT, new Left common femoral DVT,changed back to xarelto 3/17, off Plavix, change to baby ASA  12. Hyponatremia: continue FR, sodium 130. Recheck BMET prior to D/C     Length of stay, days: 30  Sonda Primes , MD 03/24/2013, 9:00 AM

## 2013-03-24 NOTE — Progress Notes (Signed)
Occupationall Therapy Note  Patient Details  Name: Arhum Peeples MRN: [id number] Date of Birth: 1931/03/12 Today's Date: 03/24/2013  Time:  1415-1500  (45 min) Pain:  None Individual session  Pt. Lying in bed.  No family present.  Addressed transfers to wc and  Toilet.  Addressed  Steps involved with transitional movements.  Pt. Unable to verbalize any steps.  Instructed pt on scooting to edge, feet under knees, and leaning forward to go from sit to stand. Pt. Leaning to left side while sitting on EOB.  Provided cues to get midline and hold.  Pt able to correct.  Did sit to stand with moderate assist and manual assist to lean forward instead of using back extenders.  Pt. Transferred to toilet with increased ease using grab bars to pull from.  Pt.stood with minimal assist and assisted with donning/doffing pants but unable to do completely.  Returned to wc and placed beside bed with call bell in place and NT making bed.     Humberto Seals 03/24/2013, 2:34 PM

## 2013-03-25 ENCOUNTER — Inpatient Hospital Stay (HOSPITAL_COMMUNITY): Payer: Medicare Other | Admitting: Physical Therapy

## 2013-03-25 LAB — BASIC METABOLIC PANEL
Calcium: 9.2 mg/dL (ref 8.4–10.5)
Chloride: 93 mEq/L — ABNORMAL LOW (ref 96–112)
Potassium: 4.4 mEq/L (ref 3.5–5.1)

## 2013-03-25 LAB — URIC ACID: Uric Acid, Serum: 3.4 mg/dL — ABNORMAL LOW (ref 4.0–7.8)

## 2013-03-25 LAB — SEDIMENTATION RATE: Sed Rate: 25 mm/hr — ABNORMAL HIGH (ref 0–16)

## 2013-03-25 NOTE — Progress Notes (Signed)
04/03/1931 [id number]  Subjective: No new complaints. No new problems. Slept well. Feeling OK. C/o L knee pain - better today  Objective: Vital signs in last 24 hours: Temp:  [97.5 F (36.4 C)-98.5 F (36.9 C)] 97.5 F (36.4 C) (03/30 0524) Pulse Rate:  [80-86] 86 (03/30 0524) Resp:  [18] 18 (03/30 0524) BP: (131-139)/(73-80) 139/73 mmHg (03/30 0524) SpO2:  [97 %] 97 % (03/30 0524) Weight change:  Last BM Date: 03/22/13  Intake/Output from previous day: 03/29 0701 - 03/30 0700 In: 280 [P.O.:280] Out: 1025 [Urine:1025] Last cbgs: CBG (last 3)  No results found for this basename: GLUCAP,  in the last 72 hours   Physical Exam General: No apparent distress    HEENT: moist mucosa Lungs: Normal effort. Lungs clear to auscultation, no crackles or wheezes. Cardiovascular: Regular rate and rhythm, no edema Musculoskeletal: L knee is tender, effusion present. No redness. Warm. Neurological: No new neurological deficits Wounds: N/A    Skin: clear Alert, cooperative   Lab Results: BMET    Component Value Date/Time   NA 130* 03/22/2013 0602   K 4.1 03/22/2013 0602   CL 95* 03/22/2013 0602   CO2 25 03/22/2013 0602   GLUCOSE 82 03/22/2013 0602   BUN 7 03/22/2013 0602   CREATININE 0.53 03/22/2013 0602   CALCIUM 9.0 03/22/2013 0602   GFRNONAA >90 03/22/2013 0602   GFRAA >90 03/22/2013 0602   CBC    Component Value Date/Time   WBC 6.9 03/23/2013 0625   RBC 3.53* 03/23/2013 0625   HGB 10.3* 03/23/2013 0625   HCT 30.7* 03/23/2013 0625   PLT 310 03/23/2013 0625   MCV 87.0 03/23/2013 0625   MCH 29.2 03/23/2013 0625   MCHC 33.6 03/23/2013 0625   RDW 15.3 03/23/2013 0625   LYMPHSABS 2.3 02/23/2013 0830   MONOABS 0.7 02/23/2013 0830   EOSABS 0.1 02/23/2013 0830   BASOSABS 0.0 02/23/2013 0830    Studies/Results: No results found.  Medications: I have reviewed the patient's current medications.  Assessment/Plan:  1. Recent CVA with left-sided weakness. Will attempt to obtain workup while  at [facility]  2. DVT Prophylaxis/Anticoagulation: SCDs.Xarelto which has negative interaction with DPH  3. Mood:Lexapro 5 mg, sleeps ok  4. Neuropsych: This patient Is capable of making decisions on his/her own behalf.  -pt admits to depression/ neuropsych reports no signs of pathopsychology  -initiated lexapro 5mg  qhs  -pt's impaired memory feeds into depression  -prior notes state he was on paxil PTA  5. Pain management. Vicodin as needed for headaches  6. Seizure disorder.Dilantin 200mg  BID--stopped ,was seizure free with low DPH level. Now on keppra.  7. Hypothyroidism. Synthroid 125 mcg daily. We'll check old records for thyroid studies  8. BPH history of prostate cancer. Flomax/Casodex.  9. Cervical spinal stenosis without evidence of radiculopathy  10. Bilateral knee pain osteoarthritis L>>R. He got tramadol for pain - did not help. Start Indocin w/caution x3d. Better on Rx 11. Short segment post tibial DVT, new Left common femoral DVT,changed back to xarelto 3/17, off Plavix, change to baby ASA  12. Hyponatremia: continue FR, sodium 130. Recheck BMET prior to D/C     Length of stay, days: 31  Sonda Primes , MD 03/25/2013, 8:38 AM

## 2013-03-25 NOTE — Progress Notes (Signed)
Physical Therapy Note  Patient Details  Name: Maximillian Habibi MRN: [id number] Date of Birth: 10/30/1931 Today's Date: 03/25/2013  [id number] (50 minutes) individual Pain: no reported pain Focus of treatment: Caregiver education with son Treatment: son instructed in and redemonstrated the following: car transfer stand/turn mod/max assist ; car transfer with sliding board mod assist; bed (standard bed) >< wc squat/pivot mod assist ; pt needs mod assist + max vcs to maintain forward flexed trunk during transfers; supine to sit (standard bed) min assist for positioning , max assist to scoot sideways in bed; supine to side to sit min assist + max tactile cues for sequencing. Son to repeat caregiver ed with therapist on Monday.    Cali Hope,JIM 03/25/2013, 11:03 AM

## 2013-03-26 ENCOUNTER — Inpatient Hospital Stay (HOSPITAL_COMMUNITY): Payer: Medicare Other | Admitting: Occupational Therapy

## 2013-03-26 ENCOUNTER — Inpatient Hospital Stay (HOSPITAL_COMMUNITY): Payer: Medicare Other | Admitting: Speech Pathology

## 2013-03-26 ENCOUNTER — Inpatient Hospital Stay (HOSPITAL_COMMUNITY): Payer: Medicare Other | Admitting: Physical Therapy

## 2013-03-26 LAB — CBC
Hemoglobin: 10.8 g/dL — ABNORMAL LOW (ref 13.0–17.0)
MCH: 28 pg (ref 26.0–34.0)
MCV: 83.2 fL (ref 78.0–100.0)
Platelets: 263 10*3/uL (ref 150–400)
RBC: 3.86 MIL/uL — ABNORMAL LOW (ref 4.22–5.81)

## 2013-03-26 MED ORDER — DIPHENHYDRAMINE-ZINC ACETATE 2-0.1 % EX CREA
TOPICAL_CREAM | Freq: Three times a day (TID) | CUTANEOUS | Status: DC | PRN
  Administered 2013-03-26: 12:00:00 via TOPICAL
  Filled 2013-03-26: qty 28

## 2013-03-26 NOTE — Progress Notes (Signed)
Speech Language Pathology Daily Session Note & Discharge Summary   Patient Details  Name: [name] MRN: [id number] Date of Birth: Jul 25, 1931  Today's Date: 03/26/2013 Time: 7829-5621 Time Calculation (min): 25 min  Short Term Goals: Week 4: SLP Short Term Goal 1 (Week 4): Patient will attend to and utilize left upper extremity during functional tasks with min assist question cues. SLP Short Term Goal 2 (Week 4): Patient will attent to left environment during functional tasks with min assist verbal cues. SLP Short Term Goal 3 (Week 4): Patient will solve moderately complex problems with min assist verbal cues to self monitor and correct.   SLP Short Term Goal 4 (Week 4): Patient will select attention to propelling wheelchair to speech room with min assist vebral cues for redirection.   Skilled Therapeutic Interventions: Skilled treatment session focused on addressing education with patient and son.  SLP facilitated session with handout and education regarding cognitive deficits.  SLP provided examples and min assist verbal/visual cues to for selective attention, initiation and left attention.  Son verbalized understanding of information and asked appropriate questions with regard to discharge.  Goals met; patient and family ready for discharge.    FIM:  Comprehension Comprehension Mode: Auditory Comprehension: 5-Understands basic 90% of the time/requires cueing < 10% of the time Expression Expression Mode: Verbal Expression: 5-Expresses basic needs/ideas: With extra time/assistive device Social Interaction Social Interaction: 4-Interacts appropriately 75 - 89% of the time - Needs redirection for appropriate language or to initiate interaction. Problem Solving Problem Solving: 4-Solves basic 75 - 89% of the time/requires cueing 10 - 24% of the time Memory Memory: 4-Recognizes or recalls 75 - 89% of the time/requires cueing 10 - 24% of the time FIM - Eating Eating Activity: 6: More  than reasonable amount of time  Pain Pain Assessment Pain Assessment: No/denies pain  Therapy/Group: Individual Therapy   Speech Language Pathology Discharge Summary  Patient Details  Name: [name] MRN: [id number] Date of Birth: 06-15-1931  Today's Date: 03/26/2013  Patient has met 4 of 4 long term goals.  Patient to discharge at [facility] level.  Reasons goals not met: n/a   Clinical Impression/Discharge Summary: Patient met 4 out of 4 long term goals during CIR stay due to gains in left attention, initiation of verbal and functional activities, recall and overall problem solving.   Patient progressed from max assist to min assist during stay and family education has been complete with son.  Patient continues to require skilled SLP services to maximize functional independence and reduce burden of care at the next level of care.    Care Partner:  Caregiver Able to Provide Assistance: Yes  Type of Caregiver Assistance: Cognitive  Recommendation:  Home Health SLP;24 hour supervision/assistance;Outpatient SLP  Rationale for SLP Follow Up: Maximize cognitive function and independence;Reduce caregiver burden   Equipment: none   Reasons for discharge: Treatment goals met;Discharged from hospital   Patient/Family Agrees with Progress Made and Goals Achieved: Yes   See FIM for current functional status  Charlane Ferretti., CCC-SLP [phone]  Idan Prime 03/26/2013, 11:14 AM

## 2013-03-26 NOTE — Progress Notes (Signed)
Physical Therapy Discharge Summary  Patient Details  Name: [name] MRN: [id number] Date of Birth: [birth year]  Today's Date: 03/26/2013 Time: 1105-1205 Time Calculation (min): 60 min  Patient has met 8 of 8 long term goals due to improved activity tolerance, improved balance, improved postural control, increased strength, decreased pain, functional use of  left upper extremity and left lower extremity, improved attention, improved awareness and improved coordination.  Patient to discharge at a wheelchair level Mod Assist for basic transfers and supervision for w/c mobility.   Patient's care partner (son and daughter) is independent to provide the necessary physical, cognitive and supervision assistance at discharge.    Reasons goals not met: All goals met  Recommendation:  Patient will benefit from ongoing skilled PT services in home health setting to continue to advance safe functional mobility, address ongoing impairments in L Hemiplegia with impaired motor control, timing and sequencing, apraxia, impaired attention to L, postural control, dynamic sitting and standing balance, gait, and minimize fall risk.  Equipment: manual w/c, RW, slideboard  Reasons for discharge: treatment goals met and discharge from hospital  Patient/family agrees with progress made and goals achieved: Yes  PT Discharge Precautions/Restrictions Precautions Precautions: Fall Pain Pain Assessment Pain Assessment: No/denies pain Pain Score: 0-No pain Vision/Perception  Vision - History Baseline Vision: Bifocals Patient Visual Report: No change from baseline Vision - Assessment Eye Alignment: Within Functional Limits Perception Perception: Impaired Inattention/Neglect: Does not attend to left visual field (improvements noted, however requires cues to attend to Lt)  Cognition  Continues to present with impaired attention to L, sustained and selective attention, impaired memory/recall, problem  solving and sequencing, awareness, initiation and self correction Sensation Sensation Light Touch: Impaired by gross assessment Light Touch Impaired Details: Impaired LUE;Impaired LLE Stereognosis: Not tested Hot/Cold: Not tested Proprioception: Impaired by gross assessment Proprioception Impaired Details: Impaired LUE;Impaired LLE Coordination Gross Motor Movements are Fluid and Coordinated: No Fine Motor Movements are Fluid and Coordinated: No Motor  Motor Motor: Hemiplegia;Abnormal postural alignment and control;Abnormal tone;Motor impersistence;Motor apraxia Motor - Discharge Observations: L hemiparesis; sits in significant thoracic flexion and posterior pelvic tilt; in standing pushes/leans posterior, impaired motor planning and sequencing, LLE motor impersistence  Mobility Bed Mobility Supine to Sit: 4: Min assist Sit to Supine: 3: Mod assist Sit to Supine - Details (indicate cue type and reason): With son present for family education patient performed supine <> sit on flat mat with simulated rail (son plans to buy rail that slides between mattresses) with mod A for sit > supine secondary to needing assistance to lift bilat LE into bed but only required min A for supine > sit.  Discussed with son use of step stool once patient on bed to scoot hips further back for better positioning on bed prior to transitioning to supine and use of rolling and bridging to change positions.  Also discussed how to position UE if patient does chose to lie on L side to prevent shoulder injury.   Transfers Stand Pivot Transfers: 3: Mod assist Stand Pivot Transfer Details (indicate cue type and reason): Performed stand pivot bed <> w/c with UE support on RW with son providing mod A for guidance of RW, verbal and tactile cues for lateral weight shifting, anterior weight shifting to maintain COG over BOS and for full pivot prior to sitting, verbal cues for sequencing still required.  Still with increased  difficulty pivoting to L and discussed with son use of slideboard to transfer from bed > w/c to L for  increased safety; unable to use slideboard w/c > bed secondary to being uphill and patient pushing posterior.   Lateral/Scoot Transfers: 3: Mod assist;With Librarian, academic Details (indicate cue type and reason): Patient and son demonstrated simulated car transfer with slideboard with son demonstrating w/c set up and parts management, board placement and providing mod A and appropriate verbal and tactile cues to assist patient with hand placement, safety, sequencing and to maintain anterior lean during scooting.  Also assisted patient with placing LE into and out of car.   Locomotion  Ambulation Ambulation: No Stairs / Additional Locomotion Stairs: No Wheelchair Mobility Wheelchair Mobility: No  Performed w/c mobility today with total A and did not focus family education session on gait or stairs secondary to patient not safe to currently ambulate with family and will use stair lift chair for stairs to enter house.  Discussed with son patient's risk for falling and that he should continue gait training with HHPT only. Patient has demonstrated ability to ambulate 40' with RW with mod A overall and performed 4-5 stairs with 2 rails and mod A overall; goals met. Trunk/Postural Assessment  Cervical Assessment Cervical Assessment: Exceptions to [facility] (cervical stenosis) Cervical AROM Overall Cervical AROM: Due to premorid status (decreased rotation and cervical extension) Thoracic Assessment Thoracic Assessment: Exceptions to [facility] (significant kyphosis) Thoracic AROM Overall Thoracic AROM: Due to premorid status Lumbar Assessment Lumbar Assessment: Exceptions to [facility] (posterior tilted) Postural Control Postural Control: Deficits on evaluation Trunk Control: Decreased ability to lean/weight shift to R in sitting and standing; presents with posterior lean in standing; decreased  anterior weight shifting  Balance Static Sitting Balance Static Sitting - Balance Support: No upper extremity supported;Feet supported Static Sitting - Level of Assistance: 5: Stand by assistance Dynamic Sitting Balance Dynamic Sitting - Balance Support: Right upper extremity supported;Left upper extremity supported;Feet supported Dynamic Sitting - Level of Assistance: 4: Min assist Static Standing Balance Static Standing - Balance Support: Right upper extremity supported;Left upper extremity supported Static Standing - Level of Assistance: 4: Min assist Dynamic Standing Balance Dynamic Standing - Balance Support: Right upper extremity supported;Left upper extremity supported Dynamic Standing - Level of Assistance: 3: Mod assist Extremity Assessment  RUE Assessment RUE Assessment: Within Functional Limits LUE Assessment LUE Assessment: Exceptions to WFL LUE AROM (degrees) LUE Overall AROM Comments: decreased shoulder movements and decreased FMC/grasp LUE Tone LUE Tone Comments: Brunnstrom stage 4 RLE Assessment RLE Assessment: Within Functional Limits LLE Assessment LLE Assessment: Exceptions to [facility] LLE Strength LLE Overall Strength: Deficits LLE Overall Strength Comments: 2-3/5 overall; poor funcitonal muscular endurance, motor impersistence  See FIM for current functional status  [name] [facility] 03/26/2013, 4:59 PM

## 2013-03-26 NOTE — Progress Notes (Signed)
Patient discharged to home with son and daughter at 66. Discharge instructions and prescriptions given by Deatra Ina, PA. Prescriptions called in to [facility] in [employer]. Patient and family verbalized understanding of discharge instructions, no further questions at discharge. Hedy Camara

## 2013-03-26 NOTE — Progress Notes (Signed)
Social Work Discharge Note Discharge Note  The overall goal for the admission was met for:   Discharge location: Yes-HOME WITH FAMILY PROVIDING 24 HOUR CARE  Length of Stay: Yes-32 DAYS  Discharge activity level: Yes-MIN LEVEL  Home/community participation: Yes  Services provided included: MD, RD, PT, OT, SLP, RN, TR, Pharmacy, Neuropsych and SW  Financial Services: Private Insurance: [facility]  Follow-up services arranged: Home Health: [facility]-PT, OT,SPT, RN, DME: [facility]-WHEELCHAIR, [facility] and Patient/Family has no preference for HH/DME agencies  Comments (or additional information):SON HERE FORM CAL TO STAY FOR A WEEK AND BEEN THROUGH FAMILY EDUCATION, DAUGHTER HAD ALREADY. AWARE OF PT'S 24 HOUR CARE NEEDS.  GAVE COMMUNITY RESOURCES FOR HIRING PRIVATE DUTY CARE  Patient/Family verbalized understanding of follow-up arrangements: Yes  Individual responsible for coordination of the follow-up plan: DEE DEE-DAUGHTER & MARK-SON  Confirmed correct DME delivered: Lucy Chris 03/26/2013    Lucy Chris

## 2013-03-26 NOTE — Progress Notes (Signed)
Patient ID: [name], male   DOB: 12/10/1931, 79 y.o.   MRN: [id number]  Subjective/Complaints: 79 year-old male with history of CAD, CABG who was recently evaluated in the emergency room 2 weeks ago at [facility] with weakness of his left arm and left leg noted to have acute infarct. By report he was subsequently transferred to [facility] after evaluation Plavix was added to aspirin. He did not receive TPA per report. Patient noted to have seizure and was loaded with intravenous Dilantin. Paperwork was very minimal due to workup being done at [facility]. He was later transferred back to [facility] for ongoing physical therapy and left-sided weakness secondary to stroke.  The patient has knee pain and takes tramadol at home for this.  The patient has a history of neck pain but not currently complaining. MRI in 2008 demonstrating multilevel cervical stenosis C3-C7 levels  No seizures No leg pain No SOB Son completed training over weekend with PT  Review of Systems  HENT:        Neurological: Positive for focal weakness. Negative for seizures. +itching bac   Objective: Vital Signs: Blood pressure 117/78, pulse 71, temperature 97.6 F (36.4 C), temperature source Oral, resp. rate 19, height 5\' 7"  (1.702 m), weight 76 kg (167 lb 8.8 oz), SpO2 98.00%. No results found. Results for orders placed during the hospital encounter of 02/22/13 (from the past 72 hour(s))  URIC ACID     Status: Abnormal   Collection Time    03/25/13  6:40 AM      Result Value Range   Uric Acid, Serum 3.4 (*) 4.0 - 7.8 mg/dL  SEDIMENTATION RATE     Status: Abnormal   Collection Time    03/25/13  6:40 AM      Result Value Range   Sed Rate 25 (*) 0 - 16 mm/hr  BASIC METABOLIC PANEL     Status: Abnormal   Collection Time    03/25/13  8:22 AM      Result Value Range   Sodium 128 (*) 135 - 145 mEq/L   Potassium 4.4  3.5 - 5.1 mEq/L   Chloride 93 (*) 96 - 112 mEq/L   CO2 24  19  - 32 mEq/L   Glucose, Bld 109 (*) 70 - 99 mg/dL   BUN 6  6 - 23 mg/dL   Creatinine, Ser 7.82  0.50 - 1.35 mg/dL   Calcium 9.2  8.4 - 95.6 mg/dL   GFR calc non Af Amer >90  >90 mL/min   GFR calc Af Amer >90  >90 mL/min   Comment:            The eGFR has been calculated     using the CKD EPI equation.     This calculation has not been     validated in all clinical     situations.     eGFR's persistently     <90 mL/min signify     possible Chronic Kidney Disease.      Vitals reviewed.  Constitutional: He is oriented to person, place, and time. He appears well-developed.  Mood/affect flat HENT:  Head: Normocephalic.  Eyes: EOM are normal.  Neck: Neck supple. No thyromegaly present.  Cardiovascular: Normal rate and regular rhythm.  Pulmonary/Chest: Effort normal and breath sounds normal. No respiratory distress.  Abdominal: Soft. Bowel sounds are normal. He exhibits no distension.  Musculoskeletal: He exhibits no edema. No calf or thigh swelling or tenderness, neg Homan's  Neurological: He is alert and oriented to person, place, and time.  Follows three step commands. Impaired day to day memory. Conversationally appropriate Motor strength is 5/5 in the right deltoid, biceps, triceps, grip, hip flexor, knee extensors, ankle dorsiflexor and plantar flexor  Motor strength is 3   3/5 in the left deltoid, biceps, triceps, grip, hip flexor, 4 minus in the knee extensor, 3 minus to 3 in ankle dorsiflexor and plantar flexor  Sensation is intact to light touch  There is evidence of tactile extinction on double simultaneous stimulation  There is evidence of left-sided neglect on visual confrontation testing  There is a right gaze preference  Verbal output is sparse but no evidence of dysarthria  Patient does have decreased sensation to light touch in the left. Midline chest incision well healed  Skin: mild erythema low back  Assessment/Plan: 1. Functional deficits secondary to R MCA  distribution infarct  Stable for D/C today F/u PCP in 1-2 weeks F/u PM&R 3 weeks See D/C summary See D/C instructionsFIM: FIM - Bathing Bathing Steps Patient Completed: Chest;Left upper leg;Right lower leg (including foot);Right Arm;Left lower leg (including foot);Left Arm;Abdomen;Front perineal area;Right upper leg;Buttocks Bathing: 4: Min-Patient completes 8-9 40f 10 parts or 75+ percent (min assist for standing balance)  FIM - Upper Body Dressing/Undressing Upper body dressing/undressing steps patient completed: Thread/unthread right sleeve of pullover shirt/dresss;Thread/unthread left sleeve of pullover shirt/dress;Put head through opening of pull over shirt/dress Upper body dressing/undressing: 4: Min-Patient completed 75 plus % of tasks FIM - Lower Body Dressing/Undressing Lower body dressing/undressing steps patient completed: Thread/unthread right underwear leg;Thread/unthread left underwear leg;Pull underwear up/down;Thread/unthread right pants leg;Thread/unthread left pants leg;Don/Doff right shoe;Don/Doff left shoe Lower body dressing/undressing: 4: Min-Patient completed 75 plus % of tasks (min assist for standing tolerance and manage around waist)  FIM - Toileting Toileting steps completed by patient: Adjust clothing prior to toileting;Performs perineal hygiene;Adjust clothing after toileting Toileting Assistive Devices: Grab bar or rail for support Toileting: 3: Mod-Patient completed 2 of 3 steps  FIM - Diplomatic Services operational officer Devices: Best boy Transfers: 3-To toilet/BSC: Mod A (lift or lower assist);3-From toilet/BSC: Mod A (lift or lower assist)  FIM - Banker Devices:  (stand pivot transfer ) Bed/Chair Transfer: 3: Bed > Chair or W/C: Mod A (lift or lower assist);3: Chair or W/C > Bed: Mod A (lift or lower assist)  FIM - Locomotion: Wheelchair Distance: 150 Locomotion: Wheelchair: 1: Total  Assistance/staff pushes wheelchair (Pt<25%) FIM - Locomotion: Ambulation Locomotion: Ambulation Assistive Devices: Designer, industrial/product Ambulation/Gait Assistance: 2: Max assist Locomotion: Ambulation: 0: Activity did not occur  Comprehension Comprehension Mode: Auditory Comprehension: 4-Understands basic 75 - 89% of the time/requires cueing 10 - 24% of the time  Expression Expression Mode: Verbal Expression: 4-Expresses basic 75 - 89% of the time/requires cueing 10 - 24% of the time. Needs helper to occlude trach/needs to repeat words.  Social Interaction Social Interaction Mode: Not assessed Social Interaction: 4-Interacts appropriately 75 - 89% of the time - Needs redirection for appropriate language or to initiate interaction.  Problem Solving Problem Solving Mode: Not assessed Problem Solving: 3-Solves basic 50 - 74% of the time/requires cueing 25 - 49% of the time  Memory Memory Mode: Not assessed Memory: 4-Recognizes or recalls 75 - 89% of the time/requires cueing 10 - 24% of the time Medical Problem List and Plan:  1. Recent CVA with left-sided weakness. Will attempt to obtain workup while at [facility]  2. DVT  Prophylaxis/Anticoagulation: SCDs.Xarelto which has negative interaction with DPH 3. Mood:Lexapro 5 mg, sleeps ok 4. Neuropsych: This patient Is capable of making decisions on his/her own behalf.  -pt admits to depression/ neuropsych reports no signs of pathopsychology   -initiated lexapro 5mg  qhs  -pt's impaired memory feeds into depression  -prior notes state he was on paxil PTA 5. Pain management. Vicodin as needed for headaches  6. Seizure disorder.Dilantin  200mg  BID--stopped ,was seizure free with low DPH level. Now on keppra.     7. Hypothyroidism. Synthroid 125 mcg daily. We'll check old records for thyroid studies  8. BPH history of prostate cancer. Flomax/Casodex.   9. Cervical spinal stenosis without evidence of radiculopathy  10. Bilateral knee pain  osteoarthritis , continue tramadol for pain 11.   Short segment post tibial DVT, new Left common femoral DVT,changed back to xarelto 3/17, off Plavix, change to baby ASA     12.  Hyponatremia: continue FR, sodium 128. F/u PCP   LOS (Days) 32 A FACE TO FACE EVALUATION WAS PERFORMED  Jessica Checketts E 03/26/2013, 7:28 AM

## 2013-03-26 NOTE — Progress Notes (Signed)
Occupational Therapy Session Note  Patient Details  Name: [name] MRN: [id number] Date of Birth: [birth year]  Today's Date: 03/26/2013 Time: 2952-8413 Time Calculation (min): 57 min  Short Term Goals: Week 4:  OT Short Term Goal 1 (Week 4): Pt will complete UB dressing with supervision OT Short Term Goal 2 (Week 4): Pt will complete toilet transfer with min assist with use of grab bar OT Short Term Goal 3 (Week 4): Pt will complete shower transfer with min assist  Skilled Therapeutic Interventions/Progress Updates:    Pt seen for hands on family education with pt's son.  Educated son on bed mobility and verbal cues to provide to increase pt's participation in mobility.  Pt's son return demonstrated use of slide board for transfer from bed to w/c and demonstrated understanding of proper placement of slide board as well as proper hand placement to increase safety with transfer.  Pt completed UB and LB dressing at EOB with setup assist and min cues for sequencing of hemi-technique.  Sit to stand with RW to pull up pants, min assist provided for steadying in standing while pt attempted to pull up pants only requiring adjusting.  Educated pt's son on toilet and walk-in shower transfer and had pt's son return demonstrate proper hand placement and cues with stand pivot to/from toilet and to/from walk-in shower.  Pt required min assist with transfers with use of grab bar.  Pt and son verbalize understanding and pt performed very well with cues and physical assistance from son.    Therapy Documentation Precautions:  Precautions Precautions: Fall Precaution Comments: L inatention Restrictions Weight Bearing Restrictions: No Pain: Pain Assessment Pain Assessment: No/denies pain  See FIM for current functional status  Therapy/Group: Individual Therapy  [name] 03/26/2013, 11:18 AM

## 2013-03-26 NOTE — Progress Notes (Signed)
Occupational Therapy Discharge Summary  Patient Details  Name: [name] MRN: [id number] Date of Birth: 09-05-1931  Today's Date: 03/26/2013  Patient has met 8 of 8 long term goals due to improved activity tolerance, improved balance, postural control, ability to compensate for deficits and functional use of  LEFT upper and LEFT lower extremity.  Patient to discharge at [facility] Assist level.  Patient's care partner is independent to provide the necessary physical and cognitive assistance at discharge.  Patient's son and daughter have attended therapies and have demonstrated understanding of care necessary for patient and both have return demonstrated safety with transfers.  Reasons goals not met: N/A  Recommendation:  Patient will benefit from ongoing skilled OT services in home health setting to continue to advance functional skills in the area of BADL, iADL and Reduce care partner burden.  Equipment: w/c, RW, and slide board  Reasons for discharge: treatment goals met and discharge from hospital  Patient/family agrees with progress made and goals achieved: Yes  OT Discharge Precautions/Restrictions  Precautions Precautions: Fall Pain Pain Assessment Pain Assessment: No/denies pain Pain Score: 0-No pain ADL ADL Grooming: Supervision/safety Where Assessed-Grooming: Sitting at sink Upper Body Bathing: Setup Where Assessed-Upper Body Bathing: Shower Lower Body Bathing: Minimal assistance Where Assessed-Lower Body Bathing: Shower Upper Body Dressing: Minimal assistance Where Assessed-Upper Body Dressing: Sitting at sink Lower Body Dressing: Minimal assistance Where Assessed-Lower Body Dressing: Sitting at sink;Standing at sink Toileting: Minimal assistance Where Assessed-Toileting: Teacher, adult education: Curator Method: Surveyor, minerals: Raised toilet seat;Grab bars Tub/Shower Transfer: Not assessed Training and development officer: Insurance underwriter Method: Warden/ranger: Shower seat with back;Grab bars Vision/Perception  Vision - History Baseline Vision: Bifocals Patient Visual Report: No change from baseline Vision - Assessment Eye Alignment: Within Functional Limits Perception Perception: Impaired Inattention/Neglect: Does not attend to left visual field (improvements noted, however requires cues to attend to Lt)  Cognition  Impaired Sensation Sensation Light Touch: Impaired by gross assessment Light Touch Impaired Details: Impaired LUE;Impaired LLE Stereognosis: Not tested Hot/Cold: Not tested Proprioception: Impaired by gross assessment Proprioception Impaired Details: Impaired LUE;Impaired LLE Coordination Gross Motor Movements are Fluid and Coordinated: No Fine Motor Movements are Fluid and Coordinated: No Motor  Motor Motor: Hemiplegia;Abnormal postural alignment and control;Abnormal tone;Motor impersistence;Motor apraxia Motor - Discharge Observations: L hemiparesis; sits in significant thoracic flexion and posterior pelvic tilt; in standing pushes/leans posterior, impaired motor planning and sequencing, LLE motor impersistence Mobility  Bed Mobility Supine to Sit: 4: Min assist Sit to Supine: 3: Mod assist Sit to Supine - Details (indicate cue type and reason): With son present for family education patient performed supine <> sit on flat mat with simulated rail (son plans to buy rail that slides between mattresses) with mod A for sit > supine secondary to needing assistance to lift bilat LE into bed but only required min A for supine > sit.  Discussed with son use of step stool once patient on bed to scoot hips further back for better positioning on bed prior to transitioning to supine and use of rolling and bridging to change positions.  Also discussed how to position UE if patient does chose to lie on L side to prevent shoulder injury.     Trunk/Postural Assessment  Cervical Assessment Cervical Assessment: Exceptions to [facility] Cervical AROM Overall Cervical AROM: Due to premorid status Thoracic Assessment Thoracic Assessment: Exceptions to [facility] Thoracic AROM Overall Thoracic AROM: Due to premorid status Lumbar  Assessment Lumbar Assessment: Exceptions to [facility]  Extremity/Trunk Assessment RUE Assessment RUE Assessment: Within Functional Limits LUE Assessment LUE Assessment: Exceptions to [facility] LUE AROM (degrees) LUE Overall AROM Comments: decreased shoulder movements and decreased FMC/grasp LUE Tone LUE Tone Comments: Brunnstrom stage 4  See FIM for current functional status  [name] 03/26/2013, 3:37 PM

## 2013-04-02 ENCOUNTER — Telehealth: Payer: Self-pay

## 2013-04-02 DIAGNOSIS — M25561 Pain in right knee: Secondary | ICD-10-CM

## 2013-04-02 DIAGNOSIS — I639 Cerebral infarction, unspecified: Secondary | ICD-10-CM

## 2013-04-02 DIAGNOSIS — M4802 Spinal stenosis, cervical region: Secondary | ICD-10-CM

## 2013-04-02 NOTE — Telephone Encounter (Signed)
[name] an occupational therapist called to get orders to see patient for therapy.

## 2013-04-02 NOTE — Telephone Encounter (Signed)
[facility] with [facility] called to get order for a hospital bed and bedside commode.  Please send orders to [facility].  Orders have been placed for this and faxed to [facility].

## 2013-04-04 NOTE — Telephone Encounter (Signed)
Tried contacting wanda to give orders but no answer.

## 2013-04-05 NOTE — Telephone Encounter (Signed)
Tried calling again to give order but no answer.

## 2013-04-16 ENCOUNTER — Inpatient Hospital Stay: Payer: Medicare Other | Admitting: Physical Medicine & Rehabilitation

## 2013-04-16 ENCOUNTER — Encounter: Payer: Medicare Other | Attending: Physical Medicine & Rehabilitation

## 2013-09-18 ENCOUNTER — Ambulatory Visit: Payer: Medicare Other | Admitting: Cardiology

## 2013-09-25 ENCOUNTER — Ambulatory Visit: Payer: Medicare Other | Admitting: Cardiology

## 2013-10-09 ENCOUNTER — Encounter: Payer: Self-pay | Admitting: Cardiology

## 2013-10-09 ENCOUNTER — Ambulatory Visit (INDEPENDENT_AMBULATORY_CARE_PROVIDER_SITE_OTHER): Payer: Medicare Other | Admitting: Cardiology

## 2013-10-09 VITALS — BP 158/83 | HR 74 | Ht 62.0 in | Wt 184.2 lb

## 2013-10-09 DIAGNOSIS — E782 Mixed hyperlipidemia: Secondary | ICD-10-CM

## 2013-10-09 DIAGNOSIS — I639 Cerebral infarction, unspecified: Secondary | ICD-10-CM

## 2013-10-09 DIAGNOSIS — I10 Essential (primary) hypertension: Secondary | ICD-10-CM

## 2013-10-09 DIAGNOSIS — I6350 Cerebral infarction due to unspecified occlusion or stenosis of unspecified cerebral artery: Secondary | ICD-10-CM

## 2013-10-09 DIAGNOSIS — I2510 Atherosclerotic heart disease of native coronary artery without angina pectoris: Secondary | ICD-10-CM

## 2013-10-09 NOTE — Assessment & Plan Note (Signed)
Symptomatically stable on limited medical therapy. He continues on Plavix, has not needed any nitroglycerin. I recommend that he keep follow up with Dr. Sherryll Burger for blood pressure checks. Willl also reassess his lipid profile and likely try a very low-dose statin once again.

## 2013-10-09 NOTE — Assessment & Plan Note (Signed)
History of stroke with residua, now status post bilateral carotid artery stenting.

## 2013-10-09 NOTE — Progress Notes (Signed)
Clinical Summary [name] is an 79 y.o.male last seen in November 2013. Interval history reviewed - significant stroke noted with subsequent rehabilitation, now on Xarelto and ASA. He is living at home now, has 24-hour care, his assistant was with him today. He is able to walk for limited distances with a walker, otherwise uses a wheelchair. Also reports having bilateral carotid stents placed at [facility] since I saw him.  Fortunately, he has been stable from a cardiac perspective. We did review his medications. He has a history of significant statin intolerance. We did revisit this issue again today and I recommended a followup lipid panel. He is not opposed to trying a different statin again.   Allergies  Allergen Reactions  . Iodinated Diagnostic Agents Other (See Comments)    Difficulty breathing, "tighten up"  . Statins Other (See Comments)    "Weakness in arms, no strength"    Current Outpatient Prescriptions  Medication Sig Dispense Refill  . bicalutamide (CASODEX) 50 MG tablet Take 1 tablet (50 mg total) by mouth daily.  30 tablet  1  . calcium carbonate (OS-CAL - DOSED IN MG OF ELEMENTAL CALCIUM) 1250 MG tablet Take 1 tablet (500 mg of elemental calcium total) by mouth daily. 500mg  elemental calcium  30 tablet  1  . cholecalciferol (VITAMIN D) 1000 UNITS tablet Take 1 tablet (1,000 Units total) by mouth daily.  30 tablet  1  . clopidogrel (PLAVIX) 75 MG tablet Take 75 mg by mouth daily.       Marland Kitchen escitalopram (LEXAPRO) 5 MG tablet Take 1 tablet (5 mg total) by mouth at bedtime.  30 tablet  1  . levETIRAcetam (KEPPRA) 500 MG tablet Take 1 tablet (500 mg total) by mouth 2 (two) times daily.  60 tablet  1  . levothyroxine (SYNTHROID, LEVOTHROID) 125 MCG tablet Take 125 mcg by mouth daily.        . Multiple Vitamin (MULTIVITAMIN WITH MINERALS) TABS Take 1 tablet by mouth daily.      . niacin (NIASPAN) 500 MG CR tablet Take 1 tablet (500 mg total) by mouth at bedtime.  60 tablet  3  .  nitroGLYCERIN (NITROSTAT) 0.4 MG SL tablet Place 1 tablet (0.4 mg total) under the tongue every 5 (five) minutes as needed for chest pain.  30 tablet  0  . omeprazole (PRILOSEC) 20 MG capsule Take 1 capsule (20 mg total) by mouth 2 (two) times daily as needed (acid reflux).  60 capsule  0  . Rivaroxaban (XARELTO) 20 MG TABS Take 1 tablet (20 mg total) by mouth daily with supper.  30 tablet  1  . senna-docusate (SENOKOT-S) 8.6-50 MG per tablet Take 1 tablet by mouth 2 (two) times daily.      . tamsulosin (FLOMAX) 0.4 MG CAPS Take 1 capsule (0.4 mg total) by mouth daily.  30 capsule  1  . traMADol (ULTRAM) 50 MG tablet Take 50 mg by mouth every 8 (eight) hours as needed.       . vitamin C (ASCORBIC ACID) 500 MG tablet Take 1 tablet (500 mg total) by mouth daily.  30 tablet  1  . Zinc 50 MG TABS Take 1 tablet (50 mg total) by mouth daily.  30 tablet  1  . oxyCODONE-acetaminophen (PERCOCET/ROXICET) 5-325 MG per tablet Take 1 tablet by mouth every 8 (eight) hours as needed.        No current facility-administered medications for this visit.    Past Medical History  Diagnosis Date  . Coronary atherosclerosis of native coronary artery     Two-vessel, LVEF 60%  . Myocardial infarction     1980, managed conservatively  . Hypothyroidism   . GERD (gastroesophageal reflux disease)   . Herpes zoster   . Hx of colonic polyps   . Mixed hyperlipidemia   . Arthritis   . Rosacea   . Urge incontinence   . Allergic rhinitis   . Seborrheic dermatitis   . Prostate cancer     Past Surgical History  Procedure Laterality Date  . Coronary artery bypass graft  12/11    Dr. Tyrone Sage - off pump LIMA to LAD, SVG to RCA  . Tonsillectomy    . Submandibular salivary gland resection      Social History [name] reports that he quit smoking about 34 years ago. His smoking use included Cigarettes. He has a 30 pack-year smoking history. He has never used smokeless tobacco. [name] reports that he drinks  alcohol.  Review of Systems No palpitations, no reported bleeding episodes. States he has a good appetite. No falls. Otherwise negative.  Physical Examination Filed Vitals:   10/09/13 1413  BP: 158/83  Pulse: 74   Filed Weights   10/09/13 1413  Weight: 184 lb 4 oz (83.575 kg)    No acute distress, seated in wheelchair.  HEENT: Conjunctiva and lids normal, oropharynx with moist mucosa.  Neck: Supple, no elevated JVP, soft carotid bruits, no thyromegaly.  Lungs: Decreased breath sounds, clear however, no labored breathing or wheezing.  Cardiac: Regular rate and rhythm with occasional ectopic beat, no S3 gallop or rub.  Chest: Well healing midline sternal incision.  Abdomen: Soft, nontender, bowel sounds present, no bruits.  Extremities: No pitting edema, distal pulses one plus.  Skin: Warm and dry. Musculoskeletal: Kyphosis noted. Neuropsychiatric: Alert and oriented x3, significant leg weakness.   Problem List and Plan   CORONARY ATHEROSCLEROSIS NATIVE CORONARY ARTERY Symptomatically stable on limited medical therapy. He continues on Plavix, has not needed any nitroglycerin. I recommend that he keep follow up with Dr. Sherryll Burger for blood pressure checks. Willl also reassess his lipid profile and likely try a very low-dose statin once again.  CVA (cerebral infarction) History of stroke with residua, now status post bilateral carotid artery stenting.  Mixed hyperlipidemia Followup FLP and CMET.    Jonelle Sidle, M.D., F.A.C.C.

## 2013-10-09 NOTE — Patient Instructions (Addendum)
Your physician recommends that you schedule a follow-up appointment in: 3 MONTHS  Your physician recommends that you return for lab work in: THIS WEEK (SLIPS GIVEN FOR LIPID PANEL, CMET)

## 2013-10-09 NOTE — Assessment & Plan Note (Signed)
Followup FLP and CMET.

## 2017-08-26 ENCOUNTER — Ambulatory Visit: Payer: Self-pay | Admitting: Urology

## 2018-07-13 ENCOUNTER — Ambulatory Visit (INDEPENDENT_AMBULATORY_CARE_PROVIDER_SITE_OTHER): Payer: Self-pay | Admitting: Otolaryngology

## 2019-11-20 ENCOUNTER — Encounter (HOSPITAL_COMMUNITY): Payer: Self-pay | Admitting: Family Medicine

## 2019-11-20 ENCOUNTER — Inpatient Hospital Stay (HOSPITAL_COMMUNITY)
Admission: AD | Admit: 2019-11-20 | Discharge: 2019-11-28 | DRG: 378 | Disposition: A | Discharge status: Home Health | Payer: Medicare Other | Source: Other Acute Inpatient Hospital | Attending: Internal Medicine | Admitting: Internal Medicine

## 2019-11-20 DIAGNOSIS — K3189 Other diseases of stomach and duodenum: Secondary | ICD-10-CM | POA: Diagnosis present

## 2019-11-20 DIAGNOSIS — D500 Iron deficiency anemia secondary to blood loss (chronic): Secondary | ICD-10-CM | POA: Diagnosis not present

## 2019-11-20 DIAGNOSIS — F329 Major depressive disorder, single episode, unspecified: Secondary | ICD-10-CM | POA: Diagnosis present

## 2019-11-20 DIAGNOSIS — E876 Hypokalemia: Secondary | ICD-10-CM | POA: Diagnosis present

## 2019-11-20 DIAGNOSIS — Z66 Do not resuscitate: Secondary | ICD-10-CM | POA: Diagnosis present

## 2019-11-20 DIAGNOSIS — Z8619 Personal history of other infectious and parasitic diseases: Secondary | ICD-10-CM

## 2019-11-20 DIAGNOSIS — I10 Essential (primary) hypertension: Secondary | ICD-10-CM | POA: Diagnosis present

## 2019-11-20 DIAGNOSIS — K449 Diaphragmatic hernia without obstruction or gangrene: Secondary | ICD-10-CM | POA: Diagnosis present

## 2019-11-20 DIAGNOSIS — E039 Hypothyroidism, unspecified: Secondary | ICD-10-CM | POA: Diagnosis present

## 2019-11-20 DIAGNOSIS — D62 Acute posthemorrhagic anemia: Secondary | ICD-10-CM | POA: Diagnosis present

## 2019-11-20 DIAGNOSIS — I252 Old myocardial infarction: Secondary | ICD-10-CM | POA: Diagnosis not present

## 2019-11-20 DIAGNOSIS — K922 Gastrointestinal hemorrhage, unspecified: Secondary | ICD-10-CM | POA: Diagnosis present

## 2019-11-20 DIAGNOSIS — R5381 Other malaise: Secondary | ICD-10-CM | POA: Diagnosis present

## 2019-11-20 DIAGNOSIS — I2510 Atherosclerotic heart disease of native coronary artery without angina pectoris: Secondary | ICD-10-CM | POA: Diagnosis present

## 2019-11-20 DIAGNOSIS — K920 Hematemesis: Secondary | ICD-10-CM | POA: Diagnosis not present

## 2019-11-20 DIAGNOSIS — Z951 Presence of aortocoronary bypass graft: Secondary | ICD-10-CM

## 2019-11-20 DIAGNOSIS — Z8719 Personal history of other diseases of the digestive system: Secondary | ICD-10-CM

## 2019-11-20 DIAGNOSIS — K254 Chronic or unspecified gastric ulcer with hemorrhage: Principal | ICD-10-CM | POA: Diagnosis present

## 2019-11-20 DIAGNOSIS — Z87891 Personal history of nicotine dependence: Secondary | ICD-10-CM

## 2019-11-20 DIAGNOSIS — Z8673 Personal history of transient ischemic attack (TIA), and cerebral infarction without residual deficits: Secondary | ICD-10-CM | POA: Diagnosis not present

## 2019-11-20 DIAGNOSIS — C61 Malignant neoplasm of prostate: Secondary | ICD-10-CM | POA: Diagnosis not present

## 2019-11-20 DIAGNOSIS — I69354 Hemiplegia and hemiparesis following cerebral infarction affecting left non-dominant side: Secondary | ICD-10-CM | POA: Diagnosis not present

## 2019-11-20 DIAGNOSIS — E782 Mixed hyperlipidemia: Secondary | ICD-10-CM | POA: Diagnosis present

## 2019-11-20 DIAGNOSIS — U071 COVID-19: Secondary | ICD-10-CM | POA: Diagnosis not present

## 2019-11-20 DIAGNOSIS — I25119 Atherosclerotic heart disease of native coronary artery with unspecified angina pectoris: Secondary | ICD-10-CM | POA: Diagnosis not present

## 2019-11-20 DIAGNOSIS — Z86718 Personal history of other venous thrombosis and embolism: Secondary | ICD-10-CM | POA: Diagnosis not present

## 2019-11-20 DIAGNOSIS — Z7901 Long term (current) use of anticoagulants: Secondary | ICD-10-CM

## 2019-11-20 DIAGNOSIS — Z8546 Personal history of malignant neoplasm of prostate: Secondary | ICD-10-CM | POA: Diagnosis not present

## 2019-11-20 DIAGNOSIS — Z993 Dependence on wheelchair: Secondary | ICD-10-CM | POA: Diagnosis not present

## 2019-11-20 DIAGNOSIS — G40909 Epilepsy, unspecified, not intractable, without status epilepticus: Secondary | ICD-10-CM

## 2019-11-20 DIAGNOSIS — K921 Melena: Secondary | ICD-10-CM

## 2019-11-20 LAB — COMPREHENSIVE METABOLIC PANEL
ALT: 32 U/L (ref 0–44)
AST: 20 U/L (ref 15–41)
Albumin: 3.1 g/dL — ABNORMAL LOW (ref 3.5–5.0)
Alkaline Phosphatase: 83 U/L (ref 38–126)
Anion gap: 9 (ref 5–15)
BUN: 56 mg/dL — ABNORMAL HIGH (ref 8–23)
CO2: 21 mmol/L — ABNORMAL LOW (ref 22–32)
Calcium: 8.4 mg/dL — ABNORMAL LOW (ref 8.9–10.3)
Chloride: 107 mmol/L (ref 98–111)
Creatinine, Ser: 0.86 mg/dL (ref 0.61–1.24)
GFR calc Af Amer: >60 mL/min (ref >60)
GFR calc non Af Amer: >60 mL/min (ref >60)
Glucose, Bld: 118 mg/dL — ABNORMAL HIGH (ref 70–99)
Potassium: 3.9 mmol/L (ref 3.5–5.1)
Sodium: 137 mmol/L (ref 135–145)
Total Bilirubin: 0.5 mg/dL (ref 0.3–1.2)
Total Protein: 6.1 g/dL — ABNORMAL LOW (ref 6.5–8.1)

## 2019-11-20 LAB — CBC
HCT: 28.5 % — ABNORMAL LOW (ref 39.0–52.0)
Hemoglobin: 9.1 g/dL — ABNORMAL LOW (ref 13.0–17.0)
MCH: 27.9 pg (ref 26.0–34.0)
MCHC: 31.9 g/dL (ref 30.0–36.0)
MCV: 87.4 fL (ref 80.0–100.0)
Platelets: 154 10*3/uL (ref 150–400)
RBC: 3.26 MIL/uL — ABNORMAL LOW (ref 4.22–5.81)
RDW: 17 % — ABNORMAL HIGH (ref 11.5–15.5)
WBC: 11.9 10*3/uL — ABNORMAL HIGH (ref 4.0–10.5)
nRBC: 0.4 % — ABNORMAL HIGH (ref 0.0–0.2)

## 2019-11-20 LAB — TYPE AND SCREEN
ABO/RH(D): A POS
Antibody Screen: NEGATIVE

## 2019-11-20 MED ORDER — ACETAMINOPHEN 325 MG PO TABS
650.0000 mg | ORAL_TABLET | Freq: Four times a day (QID) | ORAL | Status: DC | PRN
  Administered 2019-11-21: 650 mg via ORAL
  Filled 2019-11-20: qty 2

## 2019-11-20 MED ORDER — SODIUM CHLORIDE 0.9% FLUSH
3.0000 mL | Freq: Two times a day (BID) | INTRAVENOUS | Status: DC
  Administered 2019-11-20 – 2019-11-28 (×9): 3 mL via INTRAVENOUS

## 2019-11-20 MED ORDER — SODIUM CHLORIDE 0.9% FLUSH: 3.0000 mL | INTRAVENOUS | Status: DC | PRN

## 2019-11-20 MED ORDER — ACETAMINOPHEN 650 MG RE SUPP: 650.0000 mg | Freq: Four times a day (QID) | RECTAL | Status: DC | PRN

## 2019-11-20 MED ORDER — SODIUM CHLORIDE 0.9 % IV SOLN
INTRAVENOUS | Status: AC
  Administered 2019-11-20: 23:00:00 via INTRAVENOUS

## 2019-11-20 MED ORDER — SODIUM CHLORIDE 0.9 % IV SOLN
8.0000 mg/h | INTRAVENOUS | Status: AC
  Administered 2019-11-20 – 2019-11-23 (×5): 8 mg/h via INTRAVENOUS
  Filled 2019-11-20 (×15): qty 80

## 2019-11-20 MED ORDER — LEVETIRACETAM 500 MG PO TABS
500.0000 mg | ORAL_TABLET | Freq: Three times a day (TID) | ORAL | Status: DC
  Administered 2019-11-20 – 2019-11-28 (×24): 500 mg via ORAL
  Filled 2019-11-20 (×24): qty 1

## 2019-11-20 MED ORDER — PANTOPRAZOLE SODIUM 40 MG IV SOLR
40.0000 mg | Freq: Two times a day (BID) | INTRAVENOUS | Status: DC
  Administered 2019-11-24 – 2019-11-26 (×5): 40 mg via INTRAVENOUS
  Filled 2019-11-20 (×5): qty 40

## 2019-11-20 MED ORDER — PANTOPRAZOLE SODIUM 40 MG IV SOLR
INTRAVENOUS | Status: AC
  Filled 2019-11-20: qty 80

## 2019-11-20 MED ORDER — ONDANSETRON HCL 4 MG/2ML IJ SOLN
4.0000 mg | Freq: Four times a day (QID) | INTRAMUSCULAR | Status: DC | PRN
Start: 2019-11-20 — End: 2019-11-29

## 2019-11-20 MED ORDER — SODIUM CHLORIDE 0.9% FLUSH
3.0000 mL | Freq: Two times a day (BID) | INTRAVENOUS | Status: DC
  Administered 2019-11-23 – 2019-11-28 (×5): 3 mL via INTRAVENOUS

## 2019-11-20 MED ORDER — SODIUM CHLORIDE 0.9 % IV SOLN: 250.0000 mL | INTRAVENOUS | Status: DC | PRN

## 2019-11-20 NOTE — Care Management Important Message (Signed)
Brief Summary:- 85 year old with history of prior CVA, prostate cancer, prior DVT, CAD, PAD and seizures who at baseline is wheelchair-bound   Admitted to [facility] on 11/19/2019 with melena and hemoglobin of 8.1---PTA pt was on aspirin and Eliquis -Had hematemesis this morning hemoglobin dropped to 6.5 -Currently receiving reversal agent for Eliquis -Currently being transfused 2 units of packed cells  -last couple of BP readings 101/59 and 112/54 - -Dr. Manuella Ghazi from [facility] spoke with Dr. Barney Drain as they do not have GI in [facility]  --- Patient accepted to telemetry bed, GI to consult  Roxan Hockey, MD

## 2019-11-20 NOTE — H&P (Signed)
History and Physical    [name] [id number] DOB: 1931-08-19 DOA: 11/20/2019  PCP: Monico Blitz, MD   Patient coming from: Home with caregivers   Chief Complaint: Melena, hematemesis   HPI: [name] is a 85 y.o. male with medical history significant for CVA with left-sided hemiparesis, hypothyroidism, coronary artery disease, seizure disorder, prostate cancer, and history of DVT on Eliquis, who presented to the emergency department with melena.  Patient had been on full dose aspirin and Eliquis, developed melena, may have had some vomiting as well, but denied any abdominal pain.  [facility] ED and Hospital Course: Upon arrival to the ED, patient was found to be afebrile and hemodynamically stable.  He had a hemoglobin of 8.1, down from 12.0 oneyear ago.  He was admitted to [facility] and was undergoing blood transfusion when he developed hematemesis.  Hemoglobin dropped to 6.5.  He was given prothrombin complex and gastroenterologist at [facility] was consulted and agreed to see the patient in consultation here.  Patient was transferred to [facility] for admission to the hospitalist service.  He is interviewed and examined on the floor had any pain where he appears stable and has no acute complaints.  Review of Systems:  All other systems reviewed and apart from HPI, are negative.  Past Medical History:  Diagnosis Date  . Allergic rhinitis   . Arthritis   . Coronary atherosclerosis of native coronary artery    Two-vessel, LVEF 60%  . GERD (gastroesophageal reflux disease)   . Herpes zoster   . Hx of colonic polyps   . Hypothyroidism   . Mixed hyperlipidemia   . Myocardial infarction ([place])    1980, managed conservatively  . Prostate cancer ([place])   . Rosacea   . Seborrheic dermatitis   . Urge incontinence     Past Surgical History:  Procedure Laterality Date  . CORONARY ARTERY BYPASS GRAFT  12/11   Dr. Servando Snare - off pump LIMA to LAD,  SVG to RCA  . Submandibular salivary gland resection    . TONSILLECTOMY       reports that he quit smoking about 40 years ago. His smoking use included cigarettes. He has a 30.00 pack-year smoking history. He has never used smokeless tobacco. He reports current alcohol use. He reports that he does not use drugs.  Allergies  Allergen Reactions  . Iodinated Diagnostic Agents Other (See Comments)    Difficulty breathing, "tighten up"  . Statins Other (See Comments)    "Weakness in arms, no strength" zetia    Family History  Problem Relation Age of Onset  . Coronary artery disease Unknown      Prior to Admission medications   Medication Sig Start Date End Date Taking? Authorizing Provider  bicalutamide (CASODEX) 50 MG tablet Take 1 tablet (50 mg total) by mouth daily. 03/22/13   Angiulli, Lavon Paganini, PA-C  calcium carbonate (OS-CAL - DOSED IN MG OF ELEMENTAL CALCIUM) 1250 MG tablet Take 1 tablet (500 mg of elemental calcium total) by mouth daily. 500mg  elemental calcium 03/22/13   Angiulli, Lavon Paganini, PA-C  cholecalciferol (VITAMIN D) 1000 UNITS tablet Take 1 tablet (1,000 Units total) by mouth daily. 03/22/13   Angiulli, Lavon Paganini, PA-C  clopidogrel (PLAVIX) 75 MG tablet Take 75 mg by mouth daily.  10/06/13   [provider]  escitalopram (LEXAPRO) 5 MG tablet Take 1 tablet (5 mg total) by mouth at bedtime. 03/22/13   Angiulli, Lavon Paganini, PA-C  levETIRAcetam (KEPPRA) 500  MG tablet Take 1 tablet (500 mg total) by mouth 2 (two) times daily. 03/22/13   Angiulli, Lavon Paganini, PA-C  levothyroxine (SYNTHROID, LEVOTHROID) 125 MCG tablet Take 125 mcg by mouth daily.      [provider]  Multiple Vitamin (MULTIVITAMIN WITH MINERALS) TABS Take 1 tablet by mouth daily. 03/22/13   Angiulli, Lavon Paganini, PA-C  niacin (NIASPAN) 500 MG CR tablet Take 1 tablet (500 mg total) by mouth at bedtime. 03/22/13   Angiulli, Lavon Paganini, PA-C  nitroGLYCERIN (NITROSTAT) 0.4 MG SL tablet Place 1 tablet (0.4 mg  total) under the tongue every 5 (five) minutes as needed for chest pain. 03/22/13   Angiulli, Lavon Paganini, PA-C  omeprazole (PRILOSEC) 20 MG capsule Take 1 capsule (20 mg total) by mouth 2 (two) times daily as needed (acid reflux). 03/22/13   Angiulli, Lavon Paganini, PA-C  oxyCODONE-acetaminophen (PERCOCET/ROXICET) 5-325 MG per tablet Take 1 tablet by mouth every 8 (eight) hours as needed.  10/06/13   [provider]  Rivaroxaban (XARELTO) 20 MG TABS Take 1 tablet (20 mg total) by mouth daily with supper. 04/02/13   Angiulli, Lavon Paganini, PA-C  senna-docusate (SENOKOT-S) 8.6-50 MG per tablet Take 1 tablet by mouth 2 (two) times daily. 03/22/13   Angiulli, Lavon Paganini, PA-C  tamsulosin (FLOMAX) 0.4 MG CAPS Take 1 capsule (0.4 mg total) by mouth daily. 03/22/13   Angiulli, Lavon Paganini, PA-C  traMADol (ULTRAM) 50 MG tablet Take 50 mg by mouth every 8 (eight) hours as needed.  10/06/13   [provider]  vitamin C (ASCORBIC ACID) 500 MG tablet Take 1 tablet (500 mg total) by mouth daily. 03/22/13   Angiulli, Lavon Paganini, PA-C  Zinc 50 MG TABS Take 1 tablet (50 mg total) by mouth daily. 03/22/13   Cathlyn Parsons, PA-C    Physical Exam: Vitals:   11/20/19 2023  BP: 134/64  Pulse: 83  Resp: 15  Temp: 98.2 F (36.8 C)  TempSrc: Oral  SpO2: 100%    Constitutional: NAD, calm  Eyes: PERTLA, lids and conjunctivae normal ENMT: Mucous membranes are moist. Posterior pharynx clear of any exudate or lesions.   Neck: normal, supple, no masses, no thyromegaly Respiratory: no wheezing, no crackles. Normal respiratory effort. No accessory muscle use.  Cardiovascular: S1 & S2 heard, regular rate and rhythm. No JVD.  Abdomen: No distension, no tenderness, soft. Bowel sounds active.  Musculoskeletal: no clubbing / cyanosis. No joint deformity upper and lower extremities.    Skin: no significant rashes, lesions, ulcers. Warm, dry, well-perfused. Neurologic: Dysarthria, left lower facial weakness. Hemiparesis.      Psychiatric: Alert and oriented to person, place, and situation. Pleasant, cooperative.      Labs on Admission: I have personally reviewed following labs and imaging studies  CBC: No results for input(s): WBC, NEUTROABS, HGB, HCT, MCV, PLT in the last 168 hours. Basic Metabolic Panel: No results for input(s): NA, K, CL, CO2, GLUCOSE, BUN, CREATININE, CALCIUM, MG, PHOS in the last 168 hours. GFR: CrCl cannot be calculated (Patient's most recent lab result is older than the maximum 21 days allowed.). Liver Function Tests: No results for input(s): AST, ALT, ALKPHOS, BILITOT, PROT, ALBUMIN in the last 168 hours. No results for input(s): LIPASE, AMYLASE in the last 168 hours. No results for input(s): AMMONIA in the last 168 hours. Coagulation Profile: No results for input(s): INR, PROTIME in the last 168 hours. Cardiac Enzymes: No results for input(s): CKTOTAL, CKMB, CKMBINDEX, TROPONINI in the last 168 hours.  BNP (last 3 results) No results for input(s): PROBNP in the last 8760 hours. HbA1C: No results for input(s): HGBA1C in the last 72 hours. CBG: No results for input(s): GLUCAP in the last 168 hours. Lipid Profile: No results for input(s): CHOL, HDL, LDLCALC, TRIG, CHOLHDL, LDLDIRECT in the last 72 hours. Thyroid Function Tests: No results for input(s): TSH, T4TOTAL, FREET4, T3FREE, THYROIDAB in the last 72 hours. Anemia Panel: No results for input(s): VITAMINB12, FOLATE, FERRITIN, TIBC, IRON, RETICCTPCT in the last 72 hours. Urine analysis:    Component Value Date/Time   COLORURINE YELLOW 03/05/2013 1002   APPEARANCEUR HAZY (A) 03/05/2013 1002   LABSPEC 1.014 03/05/2013 1002   PHURINE 7.5 03/05/2013 1002   GLUCOSEU NEGATIVE 03/05/2013 1002   HGBUR NEGATIVE 03/05/2013 1002   [place] 03/05/2013 1002   [place] 03/05/2013 1002   PROTEINUR NEGATIVE 03/05/2013 1002   UROBILINOGEN 0.2 03/05/2013 1002   NITRITE POSITIVE (A) 03/05/2013 1002   LEUKOCYTESUR  TRACE (A) 03/05/2013 1002   Sepsis Labs: @LABRCNTIP (procalcitonin:4,lacticidven:4) )No results found for this or any previous visit (from the past 240 hour(s)).   Radiological Exams on Admission: No results found.  EKG: Independently reviewed. Sinus rhythm, LAD.   Assessment/Plan   1. Acute upper GI bleeding; acute blood-loss anemia  - Admitted to [facility] on 11/23 with melena, developed hematemesis today with drop in Hgb from 8.5 to 6.5, Eliquis and aspirin were held, he was treated with Protonix, prothrombin complex, and 2 units of RBC, and transferred to [facility] for GI consultation  - Check CBC now and type and screen, continue bowel rest, continue IV PPI, continue IVF hydration    2. History of DVT   - No evidence for acute VTE  - Eliquis was held on admission to [facility] and he was treated with prothrombin complex   3. History of CVA  - Patient is wheelchair bound, requires assistance with eating, and is able to communicate verbally at baseline  - ASA held on admission    4. Seizure disorder  - Continue Keppra    5. CAD - No anginal complaints  - Hold ASA     PPE: Mask, face shield  DVT prophylaxis: Eliquis pta, reversed at [employer], SCD's for now  Code Status: DNR/DNI, confirmed with patient and caregiver  Family Communication: Caregiver updated at bedside  Consults called: GI  Admission status: Inpatient. Acute upper GI bleeding in elderly gentleman on anticoagulation will require careful inpatient management with specialist consultation and is not reasonably expected to be stable for discharge within timeframe of observation period.     Vianne Bulls, MD [facility] Pager [phone]  If 7PM-7AM, please contact night-coverage [email or web address] Password [facility]  11/20/2019, 9:07 PM

## 2019-11-21 ENCOUNTER — Encounter (HOSPITAL_COMMUNITY): Payer: Self-pay | Admitting: Emergency Medicine

## 2019-11-21 ENCOUNTER — Other Ambulatory Visit: Payer: Self-pay

## 2019-11-21 ENCOUNTER — Encounter (HOSPITAL_COMMUNITY)
Admission: AD | Disposition: A | Discharge status: Home Health | Payer: Self-pay | Source: Other Acute Inpatient Hospital | Attending: Internal Medicine

## 2019-11-21 DIAGNOSIS — U071 COVID-19: Secondary | ICD-10-CM

## 2019-11-21 DIAGNOSIS — Z86718 Personal history of other venous thrombosis and embolism: Secondary | ICD-10-CM

## 2019-11-21 DIAGNOSIS — C61 Malignant neoplasm of prostate: Secondary | ICD-10-CM

## 2019-11-21 DIAGNOSIS — I25119 Atherosclerotic heart disease of native coronary artery with unspecified angina pectoris: Secondary | ICD-10-CM

## 2019-11-21 DIAGNOSIS — K921 Melena: Secondary | ICD-10-CM

## 2019-11-21 DIAGNOSIS — K920 Hematemesis: Secondary | ICD-10-CM

## 2019-11-21 HISTORY — PX: ESOPHAGOGASTRODUODENOSCOPY: SHX5428

## 2019-11-21 LAB — CBC
HCT: 25.9 % — ABNORMAL LOW (ref 39.0–52.0)
Hemoglobin: 8.1 g/dL — ABNORMAL LOW (ref 13.0–17.0)
MCH: 28.1 pg (ref 26.0–34.0)
MCHC: 31.3 g/dL (ref 30.0–36.0)
MCV: 89.9 fL (ref 80.0–100.0)
Platelets: 138 10*3/uL — ABNORMAL LOW (ref 150–400)
RBC: 2.88 MIL/uL — ABNORMAL LOW (ref 4.22–5.81)
RDW: 18 % — ABNORMAL HIGH (ref 11.5–15.5)
WBC: 9.5 10*3/uL (ref 4.0–10.5)
nRBC: 0.4 % — ABNORMAL HIGH (ref 0.0–0.2)

## 2019-11-21 LAB — BASIC METABOLIC PANEL
Anion gap: 10 (ref 5–15)
BUN: 50 mg/dL — ABNORMAL HIGH (ref 8–23)
CO2: 21 mmol/L — ABNORMAL LOW (ref 22–32)
Calcium: 8.4 mg/dL — ABNORMAL LOW (ref 8.9–10.3)
Chloride: 109 mmol/L (ref 98–111)
Creatinine, Ser: 0.86 mg/dL (ref 0.61–1.24)
GFR calc Af Amer: >60 mL/min (ref >60)
GFR calc non Af Amer: >60 mL/min (ref >60)
Glucose, Bld: 109 mg/dL — ABNORMAL HIGH (ref 70–99)
Potassium: 4.1 mmol/L (ref 3.5–5.1)
Sodium: 140 mmol/L (ref 135–145)

## 2019-11-21 LAB — ABO/RH: ABO/RH(D): A POS

## 2019-11-21 LAB — HEMOGLOBIN: Hemoglobin: 8.1 g/dL — ABNORMAL LOW (ref 13.0–17.0)

## 2019-11-21 LAB — SARS CORONAVIRUS 2 BY RT PCR (HOSPITAL ORDER, PERFORMED IN ~~LOC~~ HOSPITAL LAB): SARS Coronavirus 2: POSITIVE — AB

## 2019-11-21 LAB — HEMATOCRIT: HCT: 25.9 % — ABNORMAL LOW (ref 39.0–52.0)

## 2019-11-21 SURGERY — EGD (ESOPHAGOGASTRODUODENOSCOPY)
Anesthesia: Moderate Sedation

## 2019-11-21 MED ORDER — ZINC SULFATE 220 (50 ZN) MG PO CAPS
220.0000 mg | ORAL_CAPSULE | Freq: Every day | ORAL | Status: DC
  Administered 2019-11-21 – 2019-11-28 (×8): 220 mg via ORAL
  Filled 2019-11-21 (×8): qty 1

## 2019-11-21 MED ORDER — HYDROCOD POLST-CPM POLST ER 10-8 MG/5ML PO SUER: 5.0000 mL | Freq: Two times a day (BID) | ORAL | Status: DC | PRN

## 2019-11-21 MED ORDER — DEXTROSE-NACL 5-0.45 % IV SOLN
INTRAVENOUS | Status: DC
  Administered 2019-11-21 – 2019-11-22 (×4): via INTRAVENOUS
  Administered 2019-11-22: 80 mL/h via INTRAVENOUS
  Administered 2019-11-23 – 2019-11-27 (×7): via INTRAVENOUS

## 2019-11-21 MED ORDER — VITAMIN C 500 MG PO TABS
500.0000 mg | ORAL_TABLET | Freq: Every day | ORAL | Status: DC
  Administered 2019-11-21 – 2019-11-28 (×8): 500 mg via ORAL
  Filled 2019-11-21 (×8): qty 1

## 2019-11-21 MED ORDER — ALBUTEROL SULFATE HFA 108 (90 BASE) MCG/ACT IN AERS
2.0000 | INHALATION_SPRAY | RESPIRATORY_TRACT | Status: DC | PRN
  Filled 2019-11-21: qty 6.7

## 2019-11-21 MED ORDER — GUAIFENESIN-DM 100-10 MG/5ML PO SYRP: 10.0000 mL | ORAL_SOLUTION | ORAL | Status: DC | PRN

## 2019-11-21 MED ORDER — ADULT MULTIVITAMIN W/MINERALS CH
1.0000 | ORAL_TABLET | Freq: Every day | ORAL | Status: DC
  Administered 2019-11-21 – 2019-11-28 (×8): 1 via ORAL
  Filled 2019-11-21 (×8): qty 1

## 2019-11-21 MED ORDER — ALBUTEROL SULFATE HFA 108 (90 BASE) MCG/ACT IN AERS
2.0000 | INHALATION_SPRAY | Freq: Four times a day (QID) | RESPIRATORY_TRACT | Status: DC
  Administered 2019-11-21: 2 via RESPIRATORY_TRACT
  Filled 2019-11-21: qty 6.7

## 2019-11-21 NOTE — Progress Notes (Signed)
Has 24/7 home sitters and sitter present today said when patient vomited he had eaten chinese with red sauce.  He thinks that was the source of redness in emesis.  He said that his stool was very dark, blackish brown.

## 2019-11-21 NOTE — Progress Notes (Signed)
Report called to [place] at [facility].

## 2019-11-21 NOTE — Progress Notes (Signed)
Has had no cough or SOB today.  Did have liquid BM which was black.  He is alert and oriented and says he cannot see well because of cataracts.  He is able to feed himself with his right hand but has no use of left side due to CVA which he said was in 1984.   He and his home sitter said that he is able to sign consent for himself.

## 2019-11-21 NOTE — Progress Notes (Signed)
IV removed and discharge instriuctions reviewed with patient and son.  Scripts sent to pharmacy.  Son to drive home

## 2019-11-21 NOTE — Progress Notes (Addendum)
Patient Demographics:    [name], is a 85 y.o. male, DOB - November 19, 1931, [id number]  Admit date - 11/20/2019   Admitting Physician Vianne Bulls, MD  Outpatient Primary MD for the patient is Monico Blitz, MD  LOS - 1   No chief complaint on file.       Subjective:    [name] today has no fevers,  No chest pain,   -No further emesis -No further melena -Caregiver at bedside, notified of positive COVID-19 testing caregiver asked to go home on quarantine-- -caregiver should get tested if symptomatic   Assessment  & Plan :    Principal Problem:   Acute upper GI bleeding Active Problems:   CORONARY ATHEROSCLEROSIS NATIVE CORONARY ARTERY   History of CVA (cerebrovascular accident)   Prostate cancer ([place])   History of deep vein thrombosis (DVT) of lower extremity   Seizure disorder ([place])   Hematemesis without nausea   Melena   Brief Summary 85 year old with history of prior CVA, prostate cancer, prior DVT, CAD, PAD and seizures who at baseline is wheelchair-bound initially admitted to [facility] on 11/19/2019 with melena and hemoglobin of 8.1---PTA pt was on aspirin and Eliquis -Had hematemesis this morning hemoglobin dropped to 6.5 -Patient received reversal agent (prothrombin complex )for Eliquis , patient was also transfused 2 units of packed cells Upper GI bleed, transferred from [facility] for GI consult (Dr. Oneida Alar the gastroenterologist at [facility] accepted pt 11/20/19 -Covid Test came back +ve -so Transfer to [facility] for further Gi work-up -Please consult GI when pt get to [facility]  -Please see GI consult note at [facility] --Patient is not requiring specific treatment for COVID-19 infection at this time per se  A/p 1) COVID-19 infection--- does not appear to be significantly symptomatic at this time, O2 sats normal -Transfer to [facility] -Does not meet  criteria for Decadron/steroids or remdesivir -Droplet isolation along with multivitamins as ordered -Patient is not requiring specific treatment for COVID-19 infection at this time per se  2) acute GI bleed--- suspect upper GI bleed, He was transfused PRBCs and given prothrombin complex (on Eliquis for hx of DVT and prior CVA). -Now, no further hematemesis, hemoglobin up to 8.1 from 6.5 at outside facility after transfusion of 2 units of packed cells -Continue to monitor H&H and transfuse as clinically indicated C/n IV Protonix -Discussed with  Dr. Juanita Craver who is covering for [facility]    3) chronic anticoagulation--- PTA patient was on Eliquis due to history of CVA and prior DVT--- Patient received reversal agent (prothrombin complex )for Eliquis ,  4) history of seizures--- continue Keppra  5) history of CAD and prior stroke--- aspirin on hold due to GI bleed requiring transfusion  6) history of DVT--- Eliquis on hold due to GI bleed  7)Acute on chronic Anemia due to Acute Blood Loss Anemia-Gi Bleed--- treat as about in # 2,  -GI service to decide on possible endoluminal evaluation  Disposition/Need for in-Hospital Stay- patient unable to be discharged at this time due to --GI bleed requiring transfusion and the patient with COVID-19 positive status*  Code Status : dnr   Family Communication:    Discussed with DAUGHTER - Ms [name]--questions answered  Unable to reach POA/Son Chyrl [place]atte  Disposition Plan  : Transfer to [facility] for further GI work-up  Consults  :  Gi  DVT Prophylaxis  :   Hold Eliquis due to GI bleed- SCDs   Lab Results  Component Value Date   PLT 154 11/20/2019    Inpatient Medications  Scheduled Meds: . albuterol  2 puff Inhalation Q6H  . levETIRAcetam  500 mg Oral Q8H  . multivitamin with minerals  1 tablet Oral Daily  . [START ON 11/24/2019] pantoprazole  40 mg Intravenous Q12H  . sodium chloride flush  3 mL Intravenous Q12H  .  sodium chloride flush  3 mL Intravenous Q12H  . vitamin C  500 mg Oral Daily  . zinc sulfate  220 mg Oral Daily   Continuous Infusions: . sodium chloride    . dextrose 5 % and 0.45% NaCl 80 mL/hr at 11/21/19 1150  . pantoprozole (PROTONIX) infusion 8 mg/hr (11/20/19 2152)   PRN Meds:.sodium chloride, acetaminophen **OR** acetaminophen, chlorpheniramine-HYDROcodone, guaiFENesin-dextromethorphan, ondansetron (ZOFRAN) IV, sodium chloride flush    Anti-infectives (From admission, onward)   None        Objective:   Vitals:   11/20/19 2023 11/21/19 0527  BP: 134/64 (!) 118/56  Pulse: 83 69  Resp: 15 18  Temp: 98.2 F (36.8 C) 97.8 F (36.6 C)  TempSrc: Oral Oral  SpO2: 100% 94%  Weight: 90.4 kg   Height: 5\' 7"  (1.702 m)     Wt Readings from Last 3 Encounters:  11/20/19 90.4 kg  10/09/13 83.6 kg  03/21/13 76 kg     Intake/Output Summary (Last 24 hours) at 11/21/2019 1552 Last data filed at 11/21/2019 0600 Gross per 24 hour  Intake 774.24 ml  Output -  Net 774.24 ml     Physical Exam  Gen:- Awake Alert,  In no apparent distress, chronically ill-appearing HEENT:- [place].AT, No sclera icterus, facial asymmetry (not new) Neck-Supple Neck,No JVD,.  Lungs-  CTAB , fair symmetrical air movement CV- S1, S2 normal, regular  Abd-  +ve B.Sounds, Abd Soft, No tenderness,    Extremity/Skin:- No  edema, pedal pulses present  Psych-affect is appropriate, oriented x3 Neuro-residual left-sided hemiparesis no new focal deficits, generalized weakness and bedbound/wheelchair bound status at baseline no tremors   Data Review:   Micro Results Recent Results (from the past 240 hour(s))  SARS Coronavirus 2 by RT PCR (hospital order, performed in [facility] hospital lab) Nasopharyngeal Nasopharyngeal Swab     Status: Abnormal   Collection Time: 11/21/19 12:42 PM   Specimen: Nasopharyngeal Swab  Result Value Ref Range Status   SARS Coronavirus 2 POSITIVE (A) NEGATIVE Final     Comment: RESULT CALLED TO, READ BACK BY AND VERIFIED WITH: Cristopher Peru RN 14:10 11/21/19 ([email or web address]) (NOTE) SARS-CoV-2 target nucleic acids are DETECTED SARS-CoV-2 RNA is generally detectable in upper respiratory specimens  during the acute phase of infection.  Positive results are indicative  of the presence of the identified virus, but do not rule out bacterial infection or co-infection with other pathogens not detected by the test.  Clinical correlation with patient history and  other diagnostic information is necessary to determine patient infection status.  The expected result is negative. Fact Sheet for Patients:   StrictlyIdeas.no  Fact Sheet for Healthcare Providers:   BankingDealers.co.za   This test is not yet approved or cleared by the [place] FDA and  has been authorized for detection and/or diagnosis of SARS-CoV-2 by FDA under an  Emergency Use Authorization (EUA).  This EUA will remain in effect (meaning this test ca n be used) for the duration of  the COVID-19 declaration under Section 564(b)(1) of the Act, 21 U.S.C. section 360-bbb-3(b)(1), unless the authorization is terminated or revoked sooner. Performed at [facility], [place] [address], [place], [place] [address]     Radiology Reports No results found.   CBC Recent Labs  Lab 11/20/19 2147 11/21/19 0540  WBC 11.9*  --   HGB 9.1* 8.1*  HCT 28.5* 25.9*  PLT 154  --   MCV 87.4  --   MCH 27.9  --   MCHC 31.9  --   RDW 17.0*  --     Chemistries  Recent Labs  Lab 11/20/19 2147 11/21/19 0540  NA 137 140  K 3.9 4.1  CL 107 109  CO2 21* 21*  GLUCOSE 118* 109*  BUN 56* 50*  CREATININE 0.86 0.86  CALCIUM 8.4* 8.4*  AST 20  --   ALT 32  --   ALKPHOS 83  --   BILITOT 0.5  --    ------------------------------------------------------------------------------------------------------------------ No results for input(s): CHOL, HDL, LDLCALC, TRIG,  CHOLHDL, LDLDIRECT in the last 72 hours.  Lab Results  Component Value Date   HGBA1C (H) 12/05/2010    5.8 (NOTE)                                                                       According to the ADA Clinical Practice Recommendations for 2011, when HbA1c is used as a screening test:   >=6.5%   Diagnostic of Diabetes Mellitus           (if abnormal result  is confirmed)  5.7-6.4%   Increased risk of developing Diabetes Mellitus  References:Diagnosis and Classification of Diabetes Mellitus,Diabetes Care,2011,34(Suppl 1):S62-S69 and Standards of Medical Care in         Diabetes - 2011,Diabetes [id number]  (Suppl 1):S11-S61.   ------------------------------------------------------------------------------------------------------------------ No results for input(s): TSH, T4TOTAL, T3FREE, THYROIDAB in the last 72 hours.  Invalid input(s): FREET3 ------------------------------------------------------------------------------------------------------------------ No results for input(s): VITAMINB12, FOLATE, FERRITIN, TIBC, IRON, RETICCTPCT in the last 72 hours.  Coagulation profile No results for input(s): INR, PROTIME in the last 168 hours.  No results for input(s): DDIMER in the last 72 hours.  Cardiac Enzymes No results for input(s): CKMB, TROPONINI, MYOGLOBIN in the last 168 hours.  Invalid input(s): CK ------------------------------------------------------------------------------------------------------------------ No results found for: BNP   Roxan Hockey M.D on 11/21/2019 at 3:52 PM  Go to [email or web address] - for contact info  [facility] - Office  [phone]

## 2019-11-21 NOTE — Consult Note (Signed)
Referring Provider: [facility] Primary Care Physician:  Monico Blitz, MD Primary Gastroenterologist:  Dr. Gala Romney (previously unassigned)  Date of Admission: 11/20/2019 Date of Consultation: 11/21/2019  Reason for Consultation:  UGI Bleed, anemia  HPI:  [name] is a 85 y.o. male with a past medical history of two-vessel atherosclerosis, GERD, history of colon polyps, MI in 1980 managed conservatively, prostate cancer.  Also status post CVA with left-sided hemiparesis.  Currently on Eliquis.  He presented the emergency department at [facility] for melena on full dose aspirin and Eliquis.  In the ER at [facility] he had a hemoglobin of 8.1 which was down from 12.0-year prior.  Underwent blood transfusion there at which point he developed hematemesis and his hemoglobin dropped to 6.5.  He was given Antithrombin complex to reverse Eliquis and transferred to [facility] for GI consult.  He received a total of 2 units PRBCs.  Apparent history of DVT but there is no evidence for acute VTE, Eliquis held on admission to [facility].  He is wheelchair-bound related to his past CVA and requires assistance with feeding.  His hemoglobin after transfusion yesterday improved to 9.1.  Today is again down to 8.1.  Today he states he's doing ok. Some back soreness. Admits dark stools prior to [facility] presentation. Agrees he had an episode of hematemesis at [employer]. Denies any further BM since being at [facility]. No further N/V. Denies abdominal pain. Has not had anything to eat today. States they stopped his blood thinner and gave him medicine to reverse. He has a Biomedical engineer at bedside. Denies ETOH use, recreational drugs. No oher ovet GI symptoms. Last colonoscopy about 10 years ago ()in [place], [place]) with polyps and recommended 5 year repeat.  CVA was "many years ago" per bedside sitter.  Past Medical History:  Diagnosis Date  . Allergic rhinitis   . Arthritis   . Coronary  atherosclerosis of native coronary artery    Two-vessel, LVEF 60%  . GERD (gastroesophageal reflux disease)   . Herpes zoster   . Hx of colonic polyps   . Hypothyroidism   . Mixed hyperlipidemia   . Myocardial infarction ([place])    1980, managed conservatively  . Prostate cancer ([place])   . Rosacea   . Seborrheic dermatitis   . Urge incontinence     Past Surgical History:  Procedure Laterality Date  . CORONARY ARTERY BYPASS GRAFT  12/11   Dr. Servando Snare - off pump LIMA to LAD, SVG to RCA  . Submandibular salivary gland resection    . TONSILLECTOMY      Prior to Admission medications   Medication Sig Start Date End Date Taking? Authorizing Provider  albuterol (PROVENTIL) (2.5 MG/3ML) 0.083% nebulizer solution Take 2.5 mg by nebulization every 6 (six) hours as needed. 11/13/19  Yes [provider]  bicalutamide (CASODEX) 50 MG tablet Take 1 tablet (50 mg total) by mouth daily. 03/22/13  Yes Angiulli, Lavon Paganini, PA-C  ECOTRIN 325 MG EC tablet Take 325 mg by mouth every morning. 11/05/19  Yes [provider]  ELIQUIS 5 MG TABS tablet Take 5 mg by mouth 2 (two) times daily. 10/05/19  Yes [provider]  escitalopram (LEXAPRO) 20 MG tablet Take 20 mg by mouth at bedtime. 11/05/19  Yes [provider]  ezetimibe (ZETIA) 10 MG tablet Take 10 mg by mouth every morning. 11/05/19  Yes [provider]  levETIRAcetam (KEPPRA) 500 MG tablet Take 1 tablet (500 mg total) by mouth 2 (  two) times daily. Patient taking differently: Take 500 mg by mouth 3 (three) times daily.  03/22/13  Yes Angiulli, Lavon Paganini, PA-C  levothyroxine (SYNTHROID, LEVOTHROID) 125 MCG tablet Take 125 mcg by mouth daily.     Yes [provider]  metoprolol succinate (TOPROL-XL) 25 MG 24 hr tablet Take 25 mg by mouth every morning. 11/05/19  Yes [provider]  Multiple Vitamin (MULTIVITAMIN WITH MINERALS) TABS Take 1 tablet by mouth daily. 03/22/13  Yes Angiulli, Lavon Paganini, PA-C   niacin (NIASPAN) 500 MG CR tablet Take 1 tablet (500 mg total) by mouth at bedtime. 03/22/13  Yes Angiulli, Lavon Paganini, PA-C  nitroGLYCERIN (NITROSTAT) 0.4 MG SL tablet Place 1 tablet (0.4 mg total) under the tongue every 5 (five) minutes as needed for chest pain. 03/22/13  Yes Angiulli, Lavon Paganini, PA-C  omeprazole (PRILOSEC) 20 MG capsule Take 1 capsule (20 mg total) by mouth 2 (two) times daily as needed (acid reflux). 03/22/13  Yes Angiulli, Lavon Paganini, PA-C  pramipexole (MIRAPEX) 1 MG tablet Take 1 mg by mouth daily.   Yes [provider]  PROAIR HFA 108 (90 Base) MCG/ACT inhaler Inhale 1 puff into the lungs every 6 (six) hours as needed for wheezing or shortness of breath.  11/14/19  Yes [provider]  senna-docusate (SENOKOT-S) 8.6-50 MG per tablet Take 1 tablet by mouth 2 (two) times daily. Patient taking differently: Take 1 tablet by mouth daily.  03/22/13  Yes Angiulli, Lavon Paganini, PA-C  tamsulosin (FLOMAX) 0.4 MG CAPS Take 1 capsule (0.4 mg total) by mouth daily. Patient taking differently: Take 0.8 mg by mouth daily.  03/22/13  Yes Angiulli, Lavon Paganini, PA-C  traMADol (ULTRAM) 50 MG tablet Take 50 mg by mouth every 8 (eight) hours as needed for moderate pain or severe pain.  10/06/13  Yes [provider]  vitamin C (ASCORBIC ACID) 500 MG tablet Take 1 tablet (500 mg total) by mouth daily. 03/22/13  Yes Angiulli, Lavon Paganini, PA-C  Zinc 50 MG TABS Take 1 tablet (50 mg total) by mouth daily. 03/22/13  Yes Angiulli, Lavon Paganini, PA-C  calcium carbonate (OS-CAL - DOSED IN MG OF ELEMENTAL CALCIUM) 1250 MG tablet Take 1 tablet (500 mg of elemental calcium total) by mouth daily. 500mg  elemental calcium 03/22/13   Angiulli, Lavon Paganini, PA-C  cholecalciferol (VITAMIN D) 1000 UNITS tablet Take 1 tablet (1,000 Units total) by mouth daily. 03/22/13   Angiulli, Lavon Paganini, PA-C    Current Facility-Administered Medications  Medication Dose Route Frequency Provider Last Rate Last Dose  . 0.9 %   sodium chloride infusion  250 mL Intravenous PRN Opyd, Ilene Qua, MD      . 0.9 %  sodium chloride infusion   Intravenous Continuous Opyd, Ilene Qua, MD 80 mL/hr at 11/20/19 2251    . acetaminophen (TYLENOL) tablet 650 mg  650 mg Oral Q6H PRN Opyd, Ilene Qua, MD       Or  . acetaminophen (TYLENOL) suppository 650 mg  650 mg Rectal Q6H PRN Opyd, Ilene Qua, MD      . levETIRAcetam (KEPPRA) tablet 500 mg  500 mg Oral Q8H Opyd, Ilene Qua, MD   500 mg at 11/21/19 0607  . ondansetron (ZOFRAN) injection 4 mg  4 mg Intravenous Q6H PRN Opyd, Ilene Qua, MD      . pantoprazole (PROTONIX) 80 mg in sodium chloride 0.9 % 250 mL (0.32 mg/mL) infusion  8 mg/hr Intravenous Continuous Opyd, Ilene Qua, MD 25 mL/hr at  11/20/19 2152 8 mg/hr at 11/20/19 2152  . [START ON 11/24/2019] pantoprazole (PROTONIX) injection 40 mg  40 mg Intravenous Q12H Opyd, Timothy S, MD      . sodium chloride flush (NS) 0.9 % injection 3 mL  3 mL Intravenous Q12H Opyd, Timothy S, MD      . sodium chloride flush (NS) 0.9 % injection 3 mL  3 mL Intravenous Q12H Opyd, Ilene Qua, MD   3 mL at 11/20/19 2155  . sodium chloride flush (NS) 0.9 % injection 3 mL  3 mL Intravenous PRN Opyd, Ilene Qua, MD        Allergies as of 11/20/2019 - Review Complete 11/20/2019  Allergen Reaction Noted  . Iodinated diagnostic agents Other (See Comments) 04/20/2011  . Statins Other (See Comments) 02/22/2013    Family History  Problem Relation Age of Onset  . Coronary artery disease Other     Social History   Socioeconomic History  . Marital status: Widowed    Spouse name: Not on file  . Number of children: Not on file  . Years of education: Not on file  . Highest education level: Not on file  Occupational History  . 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 Financial resource strain: Not on file  . Food insecurity    Worry: Not on file    Inability: Not on file  . Transportation needs     Medical: Not on file    Non-medical: Not on file  Tobacco Use  . Smoking status: Former Smoker    Packs/day: 2.00    Years: 15.00    Pack years: 30.00    Types: Cigarettes    Quit date: 01/27/1979    Years since quitting: 40.8  . Smokeless tobacco: Never Used  Substance and Sexual Activity  . Alcohol use: Yes  . Drug use: No  . Sexual activity: Not on file  Lifestyle  . Physical activity    Days per week: Not on file    Minutes per session: Not on file  . Stress: Not on file  Relationships  . Social Herbalist on phone: Not on file    Gets together: Not on file    Attends religious service: Not on file    Active member of club or organization: Not on file    Attends meetings of clubs or organizations: Not on file    Relationship status: Not on file  . Intimate partner violence    Fear of current or ex partner: Not on file    Emotionally abused: Not on file    Physically abused: Not on file    Forced sexual activity: Not on file  Other Topics Concern  . Not on file  Social History Narrative   Daughter lives in [place]   Son lives in [place]    Review of Systems: General: Negative for anorexia, weight loss, fever, chills, fatigue, weakness. ENT: Negative for hoarseness, difficulty swallowing. CV: Negative for chest pain, angina, palpitations, peripheral edema.  Respiratory: Negative for dyspnea at rest, cough, sputum, wheezing.  GI: See history of present illness. MS: Admits low back soreness and left elbow discomfort.  Derm: Negative for rash or itching.  Neuro: Negative for memory loss, confusion.   Endo: Negative for unusual weight change.  Heme: Negative for bruising or bleeding. Allergy: Negative for rash or hives.  Physical Exam: Vital signs in last 24 hours:  Temp:  [97.8 F (36.6 C)-98.2 F (36.8 C)] 97.8 F (36.6 C) (11/25 0527) Pulse Rate:  [69-83] 69 (11/25 0527) Resp:  [15-18] 18 (11/25 0527) BP: (118-134)/(56-64) 118/56 (11/25  0527) SpO2:  [94 %-100 %] 94 % (11/25 0527) Weight:  [90.4 kg] 90.4 kg (11/24 2023) Last BM Date: 11/20/19 General:   Alert,  Well-developed, well-nourished, pleasant and cooperative in NAD. Obvious left-sided hemiparesis. Head:  Normocephalic and atraumatic. Eyes:  Sclera clear, no icterus. Conjunctiva pink. Ears:  Normal auditory acuity. Neck:  Supple; no masses or thyromegaly. Lungs:  Clear throughout to auscultation. No wheezes, crackles, or rhonchi. No acute distress. Heart:  Regular rate and rhythm; no murmurs, clicks, rubs, or gallops. Abdomen:  Soft, nontender and nondistended. No masses, hepatosplenomegaly or hernias noted. Normal bowel sounds, without guarding, and without rebound.   Rectal:  Deferred.   Msk:  Symmetrical without gross deformities. Pulses:  Normal bilateral DP pulses noted. Extremities:  Without clubbing or edema. Neurologic:  Alert and  oriented x4;  grossly normal neurologically. Skin:  Intact without significant lesions or rashes. Psych:  Alert and cooperative. Normal mood and affect.  Intake/Output from previous day: 11/24 0701 - 11/25 0700 In: 774.2 [I.V.:774.2] Out: -  Intake/Output this shift: No intake/output data recorded.  Lab Results: Recent Labs    11/20/19 2147 11/21/19 0540  WBC 11.9*  --   HGB 9.1* 8.1*  HCT 28.5* 25.9*  PLT 154  --    BMET Recent Labs    11/20/19 2147 11/21/19 0540  NA 137 140  K 3.9 4.1  CL 107 109  CO2 21* 21*  GLUCOSE 118* 109*  BUN 56* 50*  CREATININE 0.86 0.86  CALCIUM 8.4* 8.4*   LFT Recent Labs    11/20/19 2147  PROT 6.1*  ALBUMIN 3.1*  AST 20  ALT 32  ALKPHOS 83  BILITOT 0.5   PT/INR No results for input(s): LABPROT, INR in the last 72 hours. Hepatitis Panel No results for input(s): HEPBSAG, HCVAB, HEPAIGM, HEPBIGM in the last 72 hours. C-Diff No results for input(s): CDIFFTOX in the last 72 hours.  Studies/Results: No results found.  Impression: Pleasant 85 year old male who  was transferred from [facility] for melena and hematemesis with acute anemia.  Chronic Eliquis.  Remote history of CVA with residual left-sided hemiparesis.  She received Eliquis reversal agent at [facility] after an episode of hematemesis.  His hemoglobin got as low as 6.5.  He was given blood at [facility] (total of 2 units) and transferred to [facility].  No further nausea or vomiting, no hematemesis or bowel movement since admission to [facility].  His hemoglobin yesterday was 9.1 but this declined to 8.1 today.  Possible element of equilibration.  Does not appear to be actively bleeding at the current time.  No previous history of EGD.  Denies overt GERD symptoms.  At this point will likely need to proceed with upper endoscopy to evaluate for upper GI bleed.  Differentials include esophagitis, gastritis, H. pylori, peptic ulcer disease, erosive esophagitis, duodenitis, AVM.  Less likely carcinogenic process.  Discussed the possibility of EGD. The risks, benefits, and alternatives have been discussed with the patient in detail. The patient states understanding and desires to proceed.  Plan: 1. Monitor hgb closely 2. Monitor for s/s of active GI bleed (melena, hematemesis) 3. Transfuse as necessary 4. Hold Eliquis for now 5. Remain NPO at this time 6. Further recommendations to follow   Thank you for allowing [place]  to participate in the care of Serafina Royals, DNP, AGNP-C Adult & Gerontological Nurse Practitioner [facility]    LOS: 1 day     11/21/2019, 8:00 AM

## 2019-11-21 NOTE — Progress Notes (Signed)
SARS-COV-2 positive. Dr. Gala Romney discussed with endoscopy leadership and patient needs transfer to [facility]. Discussed with hospitalist  Thank you for allowing [place] to participate in the care of [name], [place], AGNP-C Adult & Gerontological Nurse Practitioner [facility]

## 2019-11-22 ENCOUNTER — Inpatient Hospital Stay (HOSPITAL_COMMUNITY): Payer: Medicare Other | Admitting: Certified Registered Nurse Anesthetist

## 2019-11-22 ENCOUNTER — Encounter (HOSPITAL_COMMUNITY)
Admission: AD | Disposition: A | Discharge status: Home Health | Payer: Self-pay | Source: Other Acute Inpatient Hospital | Attending: Internal Medicine

## 2019-11-22 ENCOUNTER — Encounter (HOSPITAL_COMMUNITY): Payer: Self-pay | Admitting: Gastroenterology

## 2019-11-22 DIAGNOSIS — I2510 Atherosclerotic heart disease of native coronary artery without angina pectoris: Secondary | ICD-10-CM

## 2019-11-22 HISTORY — PX: ESOPHAGOGASTRODUODENOSCOPY (EGD) WITH PROPOFOL: SHX5813

## 2019-11-22 SURGERY — ESOPHAGOGASTRODUODENOSCOPY (EGD) WITH PROPOFOL
Anesthesia: General

## 2019-11-22 MED ORDER — LACTATED RINGERS IV SOLN
INTRAVENOUS | Status: DC | PRN
  Administered 2019-11-22: 15:00:00 via INTRAVENOUS

## 2019-11-22 MED ORDER — SUCCINYLCHOLINE CHLORIDE 200 MG/10ML IV SOSY
PREFILLED_SYRINGE | INTRAVENOUS | Status: DC | PRN
  Administered 2019-11-22: 120 mg via INTRAVENOUS

## 2019-11-22 MED ORDER — LEVOTHYROXINE SODIUM 25 MCG PO TABS
125.0000 ug | ORAL_TABLET | Freq: Every day | ORAL | Status: DC
  Administered 2019-11-23 – 2019-11-28 (×6): 125 ug via ORAL
  Filled 2019-11-22 (×6): qty 1

## 2019-11-22 MED ORDER — PROPOFOL 10 MG/ML IV BOLUS
INTRAVENOUS | Status: DC | PRN
  Administered 2019-11-22: 40 mg via INTRAVENOUS
  Administered 2019-11-22: 100 mg via INTRAVENOUS
  Administered 2019-11-22 (×3): 20 mg via INTRAVENOUS

## 2019-11-22 SURGICAL SUPPLY — 14 items

## 2019-11-22 NOTE — Anesthesia Postprocedure Evaluation (Signed)
Anesthesia Post Note  Patient: [name]  Procedure(s) Performed: ESOPHAGOGASTRODUODENOSCOPY (EGD) WITH PROPOFOL (N/A )     Patient location during evaluation: Endoscopy Anesthesia Type: General Level of consciousness: awake Pain management: pain level controlled Vital Signs Assessment: post-procedure vital signs reviewed and stable Respiratory status: spontaneous breathing Cardiovascular status: stable Postop Assessment: no apparent nausea or vomiting Anesthetic complications: no    Last Vitals:  Vitals:   11/22/19 0844 11/22/19 1425  BP: (!) 143/65 (!) 147/88  Pulse: 76 79  Resp: 16   Temp: (!) 36.4 C 36.8 C  SpO2: 99% 100%    Last Pain:  Vitals:   11/22/19 0844  TempSrc: Oral  PainSc:                  Everlina Gotts

## 2019-11-22 NOTE — Anesthesia Postprocedure Evaluation (Signed)
Anesthesia Post Note  Patient: [name]  Procedure(s) Performed: ESOPHAGOGASTRODUODENOSCOPY (EGD) WITH PROPOFOL (N/A )     Patient location during evaluation: Endoscopy Anesthesia Type: General Level of consciousness: awake Pain management: pain level controlled Vital Signs Assessment: post-procedure vital signs reviewed and stable Respiratory status: spontaneous breathing Cardiovascular status: stable Postop Assessment: no apparent nausea or vomiting Anesthetic complications: no    Last Vitals:  Vitals:   11/22/19 1425 11/22/19 1607  BP: (!) 147/88 140/61  Pulse: 79 81  Resp:    Temp: 36.8 C 36.6 C  SpO2: 100% 100%    Last Pain:  Vitals:   11/22/19 1607  TempSrc: Oral  PainSc:                  [name]

## 2019-11-22 NOTE — Consult Note (Signed)
CROSS COVER [place] [facility] UNASSIGNED PATIENT Reason for Consult: Black stools with anemia; Covid positive Referring Physician: [facility]-Dr. Roxan Hockey.  [name] is an 85 y.o. male.  HPI: [name], is a pleasant 85 year old white male with multiple medical problems listed below who who was transferred to [facility] from [facility] for further evaluation of anemia. Patient is unable to give me much history because of his previous history of stroke but according to my understanding his caretaker noticed some black stools and according to the records patient vomited some blood. He was on Eliquis for a previous history of DVT and also has a history of a CVA with left-sided hemiparesis. Upon arrival to the ER patient was found to be hemodynamically stable his hemoglobin had drifted down by 4 g/dL and he was admitted for further evaluation and treatment.  Apparently he was given an prothrombin complex.  Patient denies any abdominal pain nausea vomiting at this time.   Past Medical History:  Diagnosis Date  . Allergic rhinitis   . Arthritis   . Coronary atherosclerosis of native coronary artery    Two-vessel, LVEF 60%  . GERD (gastroesophageal reflux disease)   . Herpes zoster   . Hx of colonic polyps   . Hypothyroidism   . Mixed hyperlipidemia   . Myocardial infarction ([place])    1980, managed conservatively  . Prostate cancer ([place])   . Rosacea   . Seborrheic dermatitis   . Urge incontinence    Past Surgical History:  Procedure Laterality Date  . CORONARY ARTERY BYPASS GRAFT  12/11   Dr. Servando Snare - off pump LIMA to LAD, SVG to RCA  . Submandibular salivary gland resection    . TONSILLECTOMY     Family History  Problem Relation Age of Onset  . Coronary artery disease Other    Social History:  reports that he quit smoking about 40 years ago. His smoking use included cigarettes. He has a 30.00 pack-year smoking history. He has never used smokeless tobacco. He reports  current alcohol use. He reports that he does not use drugs.  Allergies:  Allergies  Allergen Reactions  . Iodinated Diagnostic Agents Other (See Comments)    Difficulty breathing, "tighten up"  . Statins Other (See Comments)    "Weakness in arms, no strength" zetia   Medications: I have reviewed the patient's current medications.  Results for orders placed or performed during the hospital encounter of 11/20/19 (from the past 48 hour(s))  CBC     Status: Abnormal   Collection Time: 11/20/19  9:47 PM  Result Value Ref Range   WBC 11.9 (H) 4.0 - 10.5 K/uL   RBC 3.26 (L) 4.22 - 5.81 MIL/uL   Hemoglobin 9.1 (L) 13.0 - 17.0 g/dL   HCT 28.5 (L) 39.0 - 52.0 %   MCV 87.4 80.0 - 100.0 fL   MCH 27.9 26.0 - 34.0 pg   MCHC 31.9 30.0 - 36.0 g/dL   RDW 17.0 (H) 11.5 - 15.5 %   Platelets 154 150 - 400 K/uL   nRBC 0.4 (H) 0.0 - 0.2 %    Comment: Performed at [facility], [address], [place], [place] [address]  Comprehensive metabolic panel     Status: Abnormal   Collection Time: 11/20/19  9:47 PM  Result Value Ref Range   Sodium 137 135 - 145 mmol/L   Potassium 3.9 3.5 - 5.1 mmol/L   Chloride 107 98 - 111 mmol/L   CO2 21 (L) 22 -  32 mmol/L   Glucose, Bld 118 (H) 70 - 99 mg/dL   BUN 56 (H) 8 - 23 mg/dL   Creatinine, Ser 0.86 0.61 - 1.24 mg/dL   Calcium 8.4 (L) 8.9 - 10.3 mg/dL   Total Protein 6.1 (L) 6.5 - 8.1 g/dL   Albumin 3.1 (L) 3.5 - 5.0 g/dL   AST 20 15 - 41 U/L   ALT 32 0 - 44 U/L   Alkaline Phosphatase 83 38 - 126 U/L   Total Bilirubin 0.5 0.3 - 1.2 mg/dL   GFR calc non Af Amer >60 >60 mL/min   GFR calc Af Amer >60 >60 mL/min   Anion gap 9 5 - 15    Comment: Performed at [facility], [address] [place], [place] [address]  Type and screen [facility]     Status: None   Collection Time: 11/20/19  9:47 PM  Result Value Ref Range   ABO/RH(D) A POS    Antibody Screen NEG    Sample Expiration      11/23/2019,2359 Performed at [facility], [address],  [place], [place] [id number]   Basic metabolic panel     Status: Abnormal   Collection Time: 11/21/19  5:40 AM  Result Value Ref Range   Sodium 140 135 - 145 mmol/L   Potassium 4.1 3.5 - 5.1 mmol/L   Chloride 109 98 - 111 mmol/L   CO2 21 (L) 22 - 32 mmol/L   Glucose, Bld 109 (H) 70 - 99 mg/dL   BUN 50 (H) 8 - 23 mg/dL   Creatinine, Ser 0.86 0.61 - 1.24 mg/dL   Calcium 8.4 (L) 8.9 - 10.3 mg/dL   GFR calc non Af Amer >60 >60 mL/min   GFR calc Af Amer >60 >60 mL/min   Anion gap 10 5 - 15    Comment: Performed at [facility], [address], [place], [place] [address]  Hemoglobin     Status: Abnormal   Collection Time: 11/21/19  5:40 AM  Result Value Ref Range   Hemoglobin 8.1 (L) 13.0 - 17.0 g/dL    Comment: Performed at [facility], [address], [place], [place] [address]  Hematocrit     Status: Abnormal   Collection Time: 11/21/19  5:40 AM  Result Value Ref Range   HCT 25.9 (L) 39.0 - 52.0 %    Comment: Performed at [facility], [address], [place], [place] [address]  SARS Coronavirus 2 by RT PCR (hospital order, performed in [place] hospital lab) Nasopharyngeal Nasopharyngeal Swab     Status: Abnormal   Collection Time: 11/21/19 12:42 PM   Specimen: Nasopharyngeal Swab  Result Value Ref Range   SARS Coronavirus 2 POSITIVE (A) NEGATIVE    Comment: RESULT CALLED TO, READ BACK BY AND VERIFIED WITH: Cristopher Peru RN 14:10 11/21/19 ([email or web address]) (NOTE) SARS-CoV-2 target nucleic acids are DETECTED SARS-CoV-2 RNA is generally detectable in upper respiratory specimens  during the acute phase of infection.  Positive results are indicative  of the presence of the identified virus, but do not rule out bacterial infection or co-infection with other pathogens not detected by the test.  Clinical correlation with patient history and  other diagnostic information is necessary to determine patient infection status.  The expected result is negative. Fact Sheet for Patients:    StrictlyIdeas.no  Fact Sheet for Healthcare Providers:   BankingDealers.co.za   This test is not yet approved or cleared by the [place] FDA and  has  been authorized for detection and/or diagnosis of SARS-CoV-2 by FDA under an Emergency Use Authorization (EUA).  This EUA will remain in effect (meaning this test ca n be used) for the duration of  the COVID-19 declaration under Section 564(b)(1) of the Act, 21 U.S.C. section 360-bbb-3(b)(1), unless the authorization is terminated or revoked sooner. Performed at [facility], [place] [address], [place], [place] [address]   CBC     Status: Abnormal   Collection Time: 11/21/19  4:52 PM  Result Value Ref Range   WBC 9.5 4.0 - 10.5 K/uL   RBC 2.88 (L) 4.22 - 5.81 MIL/uL   Hemoglobin 8.1 (L) 13.0 - 17.0 g/dL   HCT 25.9 (L) 39.0 - 52.0 %   MCV 89.9 80.0 - 100.0 fL   MCH 28.1 26.0 - 34.0 pg   MCHC 31.3 30.0 - 36.0 g/dL   RDW 18.0 (H) 11.5 - 15.5 %   Platelets 138 (L) 150 - 400 K/uL   nRBC 0.4 (H) 0.0 - 0.2 %    Comment: Performed at [facility], [address] [place], [place] [address]  ABO/Rh     Status: None   Collection Time: 11/21/19  4:52 PM  Result Value Ref Range   ABO/RH(D)      A POS Performed at [facility], [address], [place], [place] [address]   Review of Systems  Unable to perform ROS: Medical condition   Blood pressure 137/67, pulse 75, temperature 97.8 F (36.6 C), temperature source Oral, resp. rate 18, height 5\' 7"  (1.702 m), weight 90.4 kg, SpO2 100 %. Physical Exam  Constitutional: He appears well-developed and well-nourished. He appears lethargic.  HENT:  Head: Normocephalic and atraumatic.  Eyes: Conjunctivae and EOM are normal.  Neck: Neck supple.  Cardiovascular: Normal rate and regular rhythm.  Respiratory: Effort normal and breath sounds normal.  GI: Soft. Bowel sounds are normal. He exhibits no distension. There is no abdominal tenderness.  There is no rebound.  Neurological: He appears lethargic.  Skin: Skin is dry.   Assessment/Plan: 1) Posthemorrhagic anemia with melena and ?hematemesis/GERD on Eliquis/COVID POSITIVE on 10/21/2019-which has been held since admission-proceed with an EGD at this time with possible control of bleeding.  Further recommendation made after EGD has been done I have had an extensive discussion with the patient's son Elta Guadeloupe her Cruzhernandez who is his power of attorney and his daughter Freada Bergeron, about the possible risks and benefits of the procedure considering his DNR status.  2) CVA with left-sided weakness. 3) Hypertension/HyperlipidemiaCAD/History of MI.Marland Kitchen 4) Personal history of prostate cancer. 5) DNR. Juanita Craver 11/22/2019, 7:02 AM

## 2019-11-22 NOTE — Progress Notes (Signed)
PROGRESS NOTE    [name]  [id number] DOB: 09-11-1931 DOA: 11/20/2019 PCP: Monico Blitz, MD   Brief Narrative:  85 year old male with history of hypertension, hyperlipidemia, CVA with left-sided hemiparesis, hypothyroidism, CAD, seizure disorder, prostate cancer, history of DVT on Eliquis was brought to the emergency department to [facility] with the melena.  In the emergency department patient was hemodynamically stable had hemoglobin of 8.1 trended down from twelve 1 year ago.  Patient received blood transfusions, developed another episode of hematemesis with a drop of hemoglobin to 6.5.  Patient received Kcentra.  GI was consulted.  Patient was transferred to [facility] to be evaluated by GI.  As patient's [id number] came back to be positive patient is transferred to [facility] for continuity of the care and further evaluation by GI.  Assessment & Plan:   Principal Problem:   Acute upper GI bleeding Active Problems:   CORONARY ATHEROSCLEROSIS NATIVE CORONARY ARTERY   History of CVA (cerebrovascular accident)   Prostate cancer ([place])   History of deep vein thrombosis (DVT) of lower extremity   Seizure disorder (HCC)   Hematemesis without nausea   Melena   ##GI bleed -Patient developed GI bleed in the setting of aspirin, Eliquis -Patient received Kcentra to reverse Eliquis -Protonix drip -GI is consulted -N.p.o. -Continue with IV fluids -Continue to follow-up with CBC every 6 hours  ##Acute blood loss anemia -Secondary to GI bleed -Keep the patient on IV iron -Transfuse for hemoglobin less than 8  ##COVID-19 infection -Patient does not have any symptoms of hypoxia -Continue with the contact, airborne/droplet precautions -Closely monitor inflammatory markers  ##History of CAD, prior stroke -Hold aspirin, statin for now  ##Seizure disorder -Continue with Keppra IV     DVT prophylaxis: SCDs Code Status: DNR Family Communication:  Updated patient's son over the phone  disposition Plan: Pending clinical improvement   Consultants:  Gastroenterology  Procedures:  Plan for EGD  Antimicrobials:  Subjective: Denies any abdominal pain  Objective: Vitals:   11/21/19 1940 11/21/19 2141 11/22/19 0600 11/22/19 0844  BP:  (!) 143/74 137/67 (!) 143/65  Pulse:  77 75 76  Resp:  20 18 16   Temp:  99.3 F (37.4 C) 97.8 F (36.6 C) (!) 97.5 F (36.4 C)  TempSrc:  Oral Oral Oral  SpO2: 100% 97% 100% 99%  Weight:      Height:        Intake/Output Summary (Last 24 hours) at 11/22/2019 0938 Last data filed at 11/21/2019 2015 Gross per 24 hour  Intake 500.13 ml  Output 1500 ml  Net -999.87 ml   Filed Weights   11/20/19 2023  Weight: 90.4 kg    Examination:  General exam: Appears calm and comfortable  Respiratory system: Clear to auscultation. Respiratory effort normal. Cardiovascular system: S1 & S2 heard, RRR. No JVD, murmurs, rubs, gallops or clicks. No pedal edema. Gastrointestinal system: Abdomen is nondistended, soft and nontender. No organomegaly or masses felt. Normal bowel sounds heard. Central nervous system: Alert and oriented. No focal neurological deficits. Extremities: Symmetric 5 x 5 power. Skin: No rashes, lesions or ulcers Psychiatry: Judgement and insight appear normal. Mood & affect appropriate.     Data Reviewed: I have personally reviewed following labs and imaging studies  CBC: Recent Labs  Lab 11/20/19 2147 11/21/19 0540 11/21/19 1652  WBC 11.9*  --  9.5  HGB 9.1* 8.1* 8.1*  HCT 28.5* 25.9* 25.9*  MCV 87.4  --  89.9  PLT 154  --  0000000*   Basic Metabolic Panel: Recent Labs  Lab 11/20/19 2147 11/21/19 0540  NA 137 140  K 3.9 4.1  CL 107 109  CO2 21* 21*  GLUCOSE 118* 109*  BUN 56* 50*  CREATININE 0.86 0.86  CALCIUM 8.4* 8.4*   GFR: Estimated Creatinine Clearance: 66.1 mL/min (by C-G formula based on SCr of 0.86 mg/dL). Liver Function Tests: Recent Labs  Lab  11/20/19 2147  AST 20  ALT 32  ALKPHOS 83  BILITOT 0.5  PROT 6.1*  ALBUMIN 3.1*   No results for input(s): LIPASE, AMYLASE in the last 168 hours. No results for input(s): AMMONIA in the last 168 hours. Coagulation Profile: No results for input(s): INR, PROTIME in the last 168 hours. Cardiac Enzymes: No results for input(s): CKTOTAL, CKMB, CKMBINDEX, TROPONINI in the last 168 hours. BNP (last 3 results) No results for input(s): PROBNP in the last 8760 hours. HbA1C: No results for input(s): HGBA1C in the last 72 hours. CBG: No results for input(s): GLUCAP in the last 168 hours. Lipid Profile: No results for input(s): CHOL, HDL, LDLCALC, TRIG, CHOLHDL, LDLDIRECT in the last 72 hours. Thyroid Function Tests: No results for input(s): TSH, T4TOTAL, FREET4, T3FREE, THYROIDAB in the last 72 hours. Anemia Panel: No results for input(s): VITAMINB12, FOLATE, FERRITIN, TIBC, IRON, RETICCTPCT in the last 72 hours. Sepsis Labs: No results for input(s): PROCALCITON, LATICACIDVEN in the last 168 hours.  Recent Results (from the past 240 hour(s))  SARS Coronavirus 2 by RT PCR (hospital order, performed in [facility] hospital lab) Nasopharyngeal Nasopharyngeal Swab     Status: Abnormal   Collection Time: 11/21/19 12:42 PM   Specimen: Nasopharyngeal Swab  Result Value Ref Range Status   SARS Coronavirus 2 POSITIVE (A) NEGATIVE Final    Comment: RESULT CALLED TO, READ BACK BY AND VERIFIED WITH: Cristopher Peru RN 14:10 11/21/19 ([email or web address]) (NOTE) SARS-CoV-2 target nucleic acids are DETECTED SARS-CoV-2 RNA is generally detectable in upper respiratory specimens  during the acute phase of infection.  Positive results are indicative  of the presence of the identified virus, but do not rule out bacterial infection or co-infection with other pathogens not detected by the test.  Clinical correlation with patient history and  other diagnostic information is necessary to determine patient infection status.   The expected result is negative. Fact Sheet for Patients:   StrictlyIdeas.no  Fact Sheet for Healthcare Providers:   BankingDealers.co.za   This test is not yet approved or cleared by the [place] FDA and  has been authorized for detection and/or diagnosis of SARS-CoV-2 by FDA under an Emergency Use Authorization (EUA).  This EUA will remain in effect (meaning this test ca n be used) for the duration of  the COVID-19 declaration under Section 564(b)(1) of the Act, 21 U.S.C. section 360-bbb-3(b)(1), unless the authorization is terminated or revoked sooner. Performed at [facility], [place] [address], [place], [place] [address]          Radiology Studies: No results found.      Scheduled Meds: . levETIRAcetam  500 mg Oral Q8H  . multivitamin with minerals  1 tablet Oral Daily  . [START ON 11/24/2019] pantoprazole  40 mg Intravenous Q12H  . sodium chloride flush  3 mL Intravenous Q12H  . sodium chloride flush  3 mL Intravenous Q12H  . vitamin C  500 mg Oral Daily  . zinc sulfate  220 mg Oral Daily   Continuous Infusions: . sodium chloride    . dextrose  5 % and 0.45% NaCl 80 mL/hr at 11/22/19 0143  . pantoprozole (PROTONIX) infusion 8 mg/hr (11/22/19 0848)     LOS: 2 days    Time spent: 68 minutes   Robbi Spells, MD [facility] Pager [phone]  If 7PM-7AM, please contact night-coverage [email or web address] Password [facility] 11/22/2019, 9:38 AM

## 2019-11-22 NOTE — Transfer of Care (Signed)
Immediate Anesthesia Transfer of Care Note  Patient: Jaxxson Bethard  Procedure(s) Performed: ESOPHAGOGASTRODUODENOSCOPY (EGD) WITH PROPOFOL (N/A )  Patient Location: 2W31  Anesthesia Type:General  Level of Consciousness: awake, oriented and patient cooperative  Airway & Oxygen Therapy: Patient Spontanous Breathing and Patient connected to nasal cannula oxygen  Post-op Assessment: Report given to RN and Post -op Vital signs reviewed and stable  Post vital signs: Reviewed and stable  Last Vitals:  Vitals Value Taken Time  BP 140/61 11/22/19 1607  Temp 36.6 C 11/22/19 1607  Pulse 81 11/22/19 1607  Resp    SpO2 100 % 11/22/19 1607    Last Pain:  Vitals:   11/22/19 1607  TempSrc: Oral  PainSc:          Complications: No apparent anesthesia complications

## 2019-11-22 NOTE — Anesthesia Preprocedure Evaluation (Addendum)
Anesthesia Evaluation  Patient identified by MRN, date of birth, ID band Patient awake    Reviewed: Allergy & Precautions, NPO status , Patient's Chart, lab work & pertinent test results  Airway Mallampati: II  TM Distance: >3 FB     Dental   Pulmonary former smoker,    breath sounds clear to auscultation       Cardiovascular + CAD and + Past MI   Rhythm:Regular Rate:Normal     Neuro/Psych Seizures -,     GI/Hepatic Neg liver ROS, GERD  ,  Endo/Other  Hypothyroidism   Renal/GU negative Renal ROS     Musculoskeletal  (+) Arthritis ,   Abdominal   Peds  Hematology   Anesthesia Other Findings   Reproductive/Obstetrics                             Anesthesia Physical Anesthesia Plan  ASA: III  Anesthesia Plan: General   Post-op Pain Management:    Induction: Intravenous  PONV Risk Score and Plan: 2 and Ondansetron  Airway Management Planned: Oral ETT  Additional Equipment:   Intra-op Plan:   Post-operative Plan: Possible Post-op intubation/ventilation  Informed Consent: I have reviewed the patients History and Physical, chart, labs and discussed the procedure including the risks, benefits and alternatives for the proposed anesthesia with the patient or authorized representative who has indicated his/her understanding and acceptance.     Dental advisory given  Plan Discussed with: CRNA and Anesthesiologist  Anesthesia Plan Comments:         Anesthesia Quick Evaluation

## 2019-11-22 NOTE — Op Note (Addendum)
[facility] Patient Name: [name] Procedure Date : 11/22/2019 MRN: [id number] Attending MD: Juanita Craver , MD Date of Birth: 12-Mar-1931 CSN: [id number] Age: 85 Admit Type: Inpatient Procedure:                Diagnostic EGD. Indications:              Iron deficiency anemia, Hematemesis, Melena. Providers:                Nelwyn Salisbury, MD, Burtis Junes, RN, Laverda Sorenson,                            Technician, Luciana Axe, CRNA, Rosanna Randy, MD Referring MD:             Bethena Roys, MD Medicines:                Monitored Anesthesia Care Complications:            No immediate complications. Estimated Blood Loss:     Estimated blood loss: none. Procedure:                Pre-Anesthesia Assessment: - Prior to the                            procedure, a history and physical was performed,                            and patient medications and allergies were                            reviewed. The patient's tolerance of previous                            anesthesia was also reviewed. The risks and                            benefits of the procedure and the sedation options                            and risks were discussed with the patient. All                            questions were answered, and informed consent was                            obtained. Prior Anticoagulants: The patient has                            taken Eliquis (apixaban), last dose was 1 day prior                            to procedure. ASA Grade Assessment: IV - A patient  with severe systemic disease that is a constant                            threat to life. After reviewing the risks and                            benefits, the patient was deemed in satisfactory                            condition to undergo the procedure. After obtaining                            informed consent, the endoscope was passed under                             direct vision. Throughout the procedure, the                            patient's blood pressure, pulse, and oxygen                            saturations were monitored continuously. The                            GIF-H190 ([id number]) Olympus gastroscope was                            introduced through the mouth, and advanced to the                            second part of duodenum. The EGD was accomplished                            without difficulty. The patient tolerated the                            procedure well. Scope In: Scope Out: Findings:      The examined esophagus and GEJ appeared widely patent and normal; no       varices noted in the esophagus.      The entire gastric mucosa appeared nodular and somewhat edematous and       congested with friable mucosa and contact bleeding; a small ulcer was       noted in the body with some fresh hem but no visible vessel was noted. .      A small hiatal hernia was noted on retroflexion; there was no evidence       of gastric varices.      The examined duodenum was normal. Impression:               - Normal appearing, widely patent esophagus and GEJ.                           - Congestive gastropathy with a small ulcer in the  midbody..                           - Small hiatal hernia.                           - Normal examined duodenum.                           - No specimens collected as patient was on Eliquis                            till yesterday. Moderate Sedation:      MAC used. Recommendation:           - Clear liquid diet today.                           - Continue present medications.                           - Avoid anticoagulants for now.                           - Monitor serial CBC's. Procedure Code(s):        --- Professional ---                           [phone], Esophagogastroduodenoscopy, flexible,                            transoral; diagnostic,  including collection of                            specimen(s) by brushing or washing, when performed                            (separate procedure) Diagnosis Code(s):        --- Professional ---                           K92.0, Hematemesis                           K92.1, Melena (includes Hematochezia)                           D50.9, Iron deficiency anemia, unspecified                           K44.9, Diaphragmatic hernia without obstruction or                            gangrene CPT copyright 2019 American Medical Association. All rights reserved. The codes documented in this report are preliminary and upon coder review may  be revised to meet current compliance requirements. Juanita Craver, MD Juanita Craver, MD 11/22/2019 3:42:13 PM This report has been signed electronically. Number of Addenda: 0

## 2019-11-22 NOTE — Anesthesia Procedure Notes (Addendum)
Procedure Name: Intubation Date/Time: 11/22/2019 3:09 PM Performed by: Jenne Campus, CRNA Pre-anesthesia Checklist: Patient identified, Emergency Drugs available, Suction available and Patient being monitored Patient Re-evaluated:Patient Re-evaluated prior to induction Oxygen Delivery Method: Circle System Utilized Preoxygenation: Pre-oxygenation with 100% oxygen Induction Type: IV induction and Rapid sequence Laryngoscope Size: Glidescope and 4 Grade View: Grade I Tube type: Oral Tube size: 7.5 mm Number of attempts: 1 Airway Equipment and Method: Stylet and Oral airway Placement Confirmation: ETT inserted through vocal cords under direct vision,  positive ETCO2 and breath sounds checked- equal and bilateral Secured at: 22 cm Tube secured with: Tape Dental Injury: Teeth and Oropharynx as per pre-operative assessment

## 2019-11-23 DIAGNOSIS — D500 Iron deficiency anemia secondary to blood loss (chronic): Secondary | ICD-10-CM

## 2019-11-23 DIAGNOSIS — K921 Melena: Secondary | ICD-10-CM

## 2019-11-23 DIAGNOSIS — K922 Gastrointestinal hemorrhage, unspecified: Secondary | ICD-10-CM

## 2019-11-23 LAB — PREPARE RBC (CROSSMATCH)

## 2019-11-23 LAB — FERRITIN: Ferritin: 14 ng/mL — ABNORMAL LOW (ref 24–336)

## 2019-11-23 LAB — COMPREHENSIVE METABOLIC PANEL
ALT: 34 U/L (ref 0–44)
AST: 29 U/L (ref 15–41)
Albumin: 2.7 g/dL — ABNORMAL LOW (ref 3.5–5.0)
Alkaline Phosphatase: 67 U/L (ref 38–126)
Anion gap: 10 (ref 5–15)
BUN: 7 mg/dL — ABNORMAL LOW (ref 8–23)
CO2: 22 mmol/L (ref 22–32)
Calcium: 8.1 mg/dL — ABNORMAL LOW (ref 8.9–10.3)
Chloride: 103 mmol/L (ref 98–111)
Creatinine, Ser: 0.7 mg/dL (ref 0.61–1.24)
GFR calc Af Amer: >60 mL/min (ref >60)
GFR calc non Af Amer: >60 mL/min (ref >60)
Glucose, Bld: 112 mg/dL — ABNORMAL HIGH (ref 70–99)
Potassium: 3.1 mmol/L — ABNORMAL LOW (ref 3.5–5.1)
Sodium: 135 mmol/L (ref 135–145)
Total Bilirubin: 0.5 mg/dL (ref 0.3–1.2)
Total Protein: 5.1 g/dL — ABNORMAL LOW (ref 6.5–8.1)

## 2019-11-23 LAB — HEMOGLOBIN AND HEMATOCRIT, BLOOD
HCT: 22.5 % — ABNORMAL LOW (ref 39.0–52.0)
Hemoglobin: 7 g/dL — ABNORMAL LOW (ref 13.0–17.0)

## 2019-11-23 LAB — CBC WITH DIFFERENTIAL/PLATELET
Abs Immature Granulocytes: 0.04 10*3/uL (ref 0.00–0.07)
Basophils Absolute: 0 10*3/uL (ref 0.0–0.1)
Basophils Relative: 0 %
Eosinophils Absolute: 0.1 10*3/uL (ref 0.0–0.5)
Eosinophils Relative: 1 %
HCT: 22.2 % — ABNORMAL LOW (ref 39.0–52.0)
Hemoglobin: 7.1 g/dL — ABNORMAL LOW (ref 13.0–17.0)
Immature Granulocytes: 0 %
Lymphocytes Relative: 24 %
Lymphs Abs: 2.1 10*3/uL (ref 0.7–4.0)
MCH: 28 pg (ref 26.0–34.0)
MCHC: 32 g/dL (ref 30.0–36.0)
MCV: 87.4 fL (ref 80.0–100.0)
Monocytes Absolute: 0.8 10*3/uL (ref 0.1–1.0)
Monocytes Relative: 9 %
Neutro Abs: 5.9 10*3/uL (ref 1.7–7.7)
Neutrophils Relative %: 66 %
Platelets: 138 10*3/uL — ABNORMAL LOW (ref 150–400)
RBC: 2.54 MIL/uL — ABNORMAL LOW (ref 4.22–5.81)
RDW: 17.8 % — ABNORMAL HIGH (ref 11.5–15.5)
WBC: 8.9 10*3/uL (ref 4.0–10.5)
nRBC: 0 % (ref 0.0–0.2)

## 2019-11-23 LAB — C-REACTIVE PROTEIN: CRP: <0.8 mg/dL (ref <1.0)

## 2019-11-23 LAB — MAGNESIUM: Magnesium: 1.9 mg/dL (ref 1.7–2.4)

## 2019-11-23 LAB — D-DIMER, QUANTITATIVE: D-Dimer, Quant: 6.18 ug/mL-FEU — ABNORMAL HIGH (ref 0.00–0.50)

## 2019-11-23 MED ORDER — SODIUM CHLORIDE 0.9 % IV SOLN
25.0000 mg | Freq: Once | INTRAVENOUS | Status: AC
  Administered 2019-11-23: 25 mg via INTRAVENOUS
  Filled 2019-11-23: qty 2

## 2019-11-23 MED ORDER — SODIUM CHLORIDE 0.9% IV SOLUTION
Freq: Once | INTRAVENOUS | Status: AC
  Administered 2019-11-23: 14:00:00 via INTRAVENOUS

## 2019-11-23 NOTE — Plan of Care (Signed)
  Problem: Education: Goal: Knowledge of General Education information will improve Description: Including pain rating scale, medication(s)/side effects and non-pharmacologic comfort measures Outcome: Progressing   Problem: Health Behavior/Discharge Planning: Goal: Ability to manage health-related needs will improve Outcome: Progressing   Problem: Clinical Measurements: Goal: Ability to maintain clinical measurements within normal limits will improve Outcome: Progressing Goal: Will remain free from infection Outcome: Progressing Goal: Diagnostic test results will improve Outcome: Progressing Goal: Respiratory complications will improve Outcome: Progressing Goal: Cardiovascular complication will be avoided Outcome: Progressing   Problem: Nutrition: Goal: Adequate nutrition will be maintained Outcome: Progressing   Problem: Coping: Goal: Level of anxiety will decrease Outcome: Progressing   Problem: Elimination: Goal: Will not experience complications related to bowel motility Outcome: Progressing Goal: Will not experience complications related to urinary retention Outcome: Progressing   Problem: Pain Managment: Goal: General experience of comfort will improve Outcome: Progressing   Problem: Skin Integrity: Goal: Risk for impaired skin integrity will decrease Outcome: Progressing   Problem: Education: Goal: Ability to identify signs and symptoms of gastrointestinal bleeding will improve Outcome: Progressing   Problem: Fluid Volume: Goal: Will show no signs and symptoms of excessive bleeding Outcome: Progressing   Problem: Clinical Measurements: Goal: Complications related to the disease process, condition or treatment will be avoided or minimized Outcome: Progressing

## 2019-11-23 NOTE — Progress Notes (Addendum)
     Progress Note    ASSESSMENT AND PLAN:   85 yo male with hx of CVA / DVT on Eliquis admitted with IDA. Inpatient EGD yesterday > congestive gastropathy with small gastric ulcer (non-bleeding). Denies further bleeding but had drift in hgb overnight --Hgb down from 8/1 >> 7.1 overnight.  Repeat H+H at noon.  --Continue IV PPI protocol --Eliquis remains on hold. Please hold for an additional 5 days --Avoid NSAIDS   Attending Physician Note   I agree with the Advanced Practitioner's note, impression and recommendations.   Small gastric ulcer and gastropathy on EGD IV pantoprazole infusion protocol then IV/PO bid for 4 weeks, then qd long term IV iron replacement as ordered Hold Eliquis for at least 5 days, longer if bleeding persists Trend Hgb GI available as needed  Lucio Edward, MD [facility]     SUBJECTIVE   No complaints. Eating breakfast. Denies BM / black stool.   OBJECTIVE:     Vital signs in last 24 hours: Temp:  [97.5 F (36.4 C)-98.2 F (36.8 C)] 97.9 F (36.6 C) (11/27 0832) Pulse Rate:  [61-91] 61 (11/27 0832) BP: (119-147)/(58-88) 119/58 (11/27 0832) SpO2:  [97 %-100 %] 99 % (11/27 0832) Last BM Date: 11/21/19 General:   Alert, well-developed male in NAD EENT:  Normal hearing, non icteric sclera   Heart:  Regular rate and rhythm .  No lower extremity edema   Pulm: Normal respiratory effort   Abdomen:  Soft, nondistended, nontender.  Normal bowel sounds.          Neurologic:  Alert and  oriented x4;  grossly normal neurologically. Psych:  Pleasant, cooperative.  Normal mood and affect.   Intake/Output from previous day: 11/26 0701 - 11/27 0700 In: 200 [I.V.:200] Out: 2875 [Urine:2875] Intake/Output this shift: No intake/output data recorded.  Lab Results: Recent Labs    11/20/19 2147 11/21/19 0540 11/21/19 1652 11/23/19 0424  WBC 11.9*  --  9.5 8.9  HGB 9.1* 8.1* 8.1* 7.1*  HCT 28.5* 25.9* 25.9* 22.2*  PLT 154   --  138* 138*   BMET Recent Labs    11/20/19 2147 11/21/19 0540 11/23/19 0424  NA 137 140 135  K 3.9 4.1 3.1*  CL 107 109 103  CO2 21* 21* 22  GLUCOSE 118* 109* 112*  BUN 56* 50* 7*  CREATININE 0.86 0.86 0.70  CALCIUM 8.4* 8.4* 8.1*   LFT Recent Labs    11/23/19 0424  PROT 5.1*  ALBUMIN 2.7*  AST 29  ALT 34  ALKPHOS 39  BILITOT 0.5     Principal Problem:   Acute upper GI bleeding Active Problems:   CORONARY ATHEROSCLEROSIS NATIVE CORONARY ARTERY   History of CVA (cerebrovascular accident)   Prostate cancer ([place])   History of deep vein thrombosis (DVT) of lower extremity   Seizure disorder ([place])   Hematemesis without nausea   Melena     LOS: 3 days   Tye Savoy ,NP 11/23/2019, 8:58 AM     ]

## 2019-11-23 NOTE — Progress Notes (Signed)
PROGRESS NOTE    [name]  [id number] DOB: 04-28-1931 DOA: 11/20/2019 PCP: Monico Blitz, MD   Brief Narrative:  85 year old male with history of hypertension, hyperlipidemia, CVA with left-sided hemiparesis, hypothyroidism, CAD, seizure disorder, prostate cancer, history of DVT on Eliquis was brought to the emergency department to [facility] with the melena.  In the emergency department patient was hemodynamically stable had hemoglobin of 8.1 trended down from twelve 1 year ago.  Patient received blood transfusions, developed another episode of hematemesis with a drop of hemoglobin to 6.5.  Patient received Kcentra.  GI was consulted.  Patient was transferred to [facility] to be evaluated by GI.  As patient's [id number] came back to be positive patient is transferred to [facility] for continuity of the care and further evaluation by GI.  Assessment & Plan:   Principal Problem:   Acute upper GI bleeding Active Problems:   CORONARY ATHEROSCLEROSIS NATIVE CORONARY ARTERY   History of CVA (cerebrovascular accident)   Prostate cancer ([place])   History of deep vein thrombosis (DVT) of lower extremity   Seizure disorder (HCC)   Hematemesis without nausea   Melena   ##GI bleed -Patient developed GI bleed in the setting of aspirin, Eliquis -Patient received Kcentra to reverse Eliquis -Protonix drip -GI is consulted- underwent EGD and found to have small ulcer, possibly pill induced -Continue with IV fluids - hemoglobin trended down to 7.1, given 1 unit of packed RBC, IV iron  ##Acute blood loss anemia -Secondary to GI bleed -Keep the patient on IV iron -Transfuse for hemoglobin less than 8 -Hemoglobin trended down to 7.1, transfusing 1 unit of packed RBC.  ##COVID-19 infection -Patient does not have any symptoms of hypoxia -Continue with the contact, airborne/droplet precautions -Closely monitor inflammatory markers  ##History of CAD, prior stroke -Hold  aspirin, statin for now  ##Seizure disorder -Continue with Keppra IV     DVT prophylaxis: SCDs Code Status: DNR Family Communication: Updated patient's son over the phone  disposition Plan: Pending clinical improvement   Consultants:  Gastroenterology  Procedures:   EGD  Antimicrobials:  Subjective: Denies any abdominal pain  Objective: Vitals:   11/22/19 1425 11/22/19 1607 11/22/19 2233 11/23/19 0832  BP: (!) 147/88 140/61 125/61 (!) 119/58  Pulse: 79 81 91 61  Resp:      Temp: 98.2 F (36.8 C) 97.9 F (36.6 C) (!) 97.5 F (36.4 C) 97.9 F (36.6 C)  TempSrc:  Oral Oral Oral  SpO2: 100% 100% 97% 99%  Weight:      Height:        Intake/Output Summary (Last 24 hours) at 11/23/2019 0933 Last data filed at 11/23/2019 0426 Gross per 24 hour  Intake 200 ml  Output 2875 ml  Net -2675 ml   Filed Weights   11/20/19 2023  Weight: 90.4 kg    Examination:  General exam: Appears calm and comfortable  Respiratory system: Clear to auscultation. Respiratory effort normal. Cardiovascular system: S1 & S2 heard, RRR. No JVD, murmurs, rubs, gallops or clicks. No pedal edema. Gastrointestinal system: Abdomen is nondistended, soft and nontender. No organomegaly or masses felt. Normal bowel sounds heard. Central nervous system: Alert and oriented. No focal neurological deficits. Extremities: Symmetric 5 x 5 power. Skin: No rashes, lesions or ulcers Psychiatry: Judgement and insight appear normal. Mood & affect appropriate.     Data Reviewed: I have personally reviewed following labs and imaging studies  CBC: Recent Labs  Lab 11/20/19 2147 11/21/19 0540 11/21/19  1652 11/23/19 0424  WBC 11.9*  --  9.5 8.9  NEUTROABS  --   --   --  5.9  HGB 9.1* 8.1* 8.1* 7.1*  HCT 28.5* 25.9* 25.9* 22.2*  MCV 87.4  --  89.9 87.4  PLT 154  --  138* 0000000*   Basic Metabolic Panel: Recent Labs  Lab 11/20/19 2147 11/21/19 0540 11/23/19 0424  NA 137 140 135  K 3.9 4.1 3.1*   CL 107 109 103  CO2 21* 21* 22  GLUCOSE 118* 109* 112*  BUN 56* 50* 7*  CREATININE 0.86 0.86 0.70  CALCIUM 8.4* 8.4* 8.1*  MG  --   --  1.9   GFR: Estimated Creatinine Clearance: 71.1 mL/min (by C-G formula based on SCr of 0.7 mg/dL). Liver Function Tests: Recent Labs  Lab 11/20/19 2147 11/23/19 0424  AST 20 29  ALT 32 34  ALKPHOS 83 67  BILITOT 0.5 0.5  PROT 6.1* 5.1*  ALBUMIN 3.1* 2.7*   No results for input(s): LIPASE, AMYLASE in the last 168 hours. No results for input(s): AMMONIA in the last 168 hours. Coagulation Profile: No results for input(s): INR, PROTIME in the last 168 hours. Cardiac Enzymes: No results for input(s): CKTOTAL, CKMB, CKMBINDEX, TROPONINI in the last 168 hours. BNP (last 3 results) No results for input(s): PROBNP in the last 8760 hours. HbA1C: No results for input(s): HGBA1C in the last 72 hours. CBG: No results for input(s): GLUCAP in the last 168 hours. Lipid Profile: No results for input(s): CHOL, HDL, LDLCALC, TRIG, CHOLHDL, LDLDIRECT in the last 72 hours. Thyroid Function Tests: No results for input(s): TSH, T4TOTAL, FREET4, T3FREE, THYROIDAB in the last 72 hours. Anemia Panel: Recent Labs    11/23/19 0424  FERRITIN 14*   Sepsis Labs: No results for input(s): PROCALCITON, LATICACIDVEN in the last 168 hours.  Recent Results (from the past 240 hour(s))  SARS Coronavirus 2 by RT PCR (hospital order, performed in [facility] hospital lab) Nasopharyngeal Nasopharyngeal Swab     Status: Abnormal   Collection Time: 11/21/19 12:42 PM   Specimen: Nasopharyngeal Swab  Result Value Ref Range Status   SARS Coronavirus 2 POSITIVE (A) NEGATIVE Final    Comment: RESULT CALLED TO, READ BACK BY AND VERIFIED WITH: Cristopher Peru RN 14:10 11/21/19 ([email or web address]) (NOTE) SARS-CoV-2 target nucleic acids are DETECTED SARS-CoV-2 RNA is generally detectable in upper respiratory specimens  during the acute phase of infection.  Positive results are indicative  of  the presence of the identified virus, but do not rule out bacterial infection or co-infection with other pathogens not detected by the test.  Clinical correlation with patient history and  other diagnostic information is necessary to determine patient infection status.  The expected result is negative. Fact Sheet for Patients:   StrictlyIdeas.no  Fact Sheet for Healthcare Providers:   BankingDealers.co.za   This test is not yet approved or cleared by the [place] FDA and  has been authorized for detection and/or diagnosis of SARS-CoV-2 by FDA under an Emergency Use Authorization (EUA).  This EUA will remain in effect (meaning this test ca n be used) for the duration of  the COVID-19 declaration under Section 564(b)(1) of the Act, 21 U.S.C. section 360-bbb-3(b)(1), unless the authorization is terminated or revoked sooner. Performed at [facility], [place] [address] [place],  [address]          Radiology Studies: No results found.      Scheduled Meds: . sodium chloride  Intravenous Once  . levETIRAcetam  500 mg Oral Q8H  . levothyroxine  125 mcg Oral Q0600  . multivitamin with minerals  1 tablet Oral Daily  . [START ON 11/24/2019] pantoprazole  40 mg Intravenous Q12H  . sodium chloride flush  3 mL Intravenous Q12H  . sodium chloride flush  3 mL Intravenous Q12H  . vitamin C  500 mg Oral Daily  . zinc sulfate  220 mg Oral Daily   Continuous Infusions: . sodium chloride    . dextrose 5 % and 0.45% NaCl 80 mL/hr at 11/23/19 0145  . pantoprozole (PROTONIX) infusion 8 mg/hr (11/23/19 0348)     LOS: 3 days    Time spent: 62 minutes   Edy Mcbane, MD [facility] Pager [phone]  If 7PM-7AM, please contact night-coverage [email or web address] Password [facility] 11/23/2019, 9:33 AM

## 2019-11-23 NOTE — Progress Notes (Signed)
VAST consulted to place PIV. IV in R wrist infiltrated and was discontinued. Pt currently receiving blood in RAF PIV.  Assessed RA using [place]; no appropriate veins for USGIV at this time. Unable to use pt's left arm as it was affected by stroke. Unit RN to alter med times to continue using 1 PIV.

## 2019-11-24 LAB — CBC
HCT: 28.9 % — ABNORMAL LOW (ref 39.0–52.0)
Hemoglobin: 9.5 g/dL — ABNORMAL LOW (ref 13.0–17.0)
MCH: 28.5 pg (ref 26.0–34.0)
MCHC: 32.9 g/dL (ref 30.0–36.0)
MCV: 86.8 fL (ref 80.0–100.0)
Platelets: 150 10*3/uL (ref 150–400)
RBC: 3.33 MIL/uL — ABNORMAL LOW (ref 4.22–5.81)
RDW: 17.5 % — ABNORMAL HIGH (ref 11.5–15.5)
WBC: 9.8 10*3/uL (ref 4.0–10.5)
nRBC: 0 % (ref 0.0–0.2)

## 2019-11-24 LAB — TYPE AND SCREEN
ABO/RH(D): A POS
Antibody Screen: NEGATIVE
Unit division: 0

## 2019-11-24 LAB — BASIC METABOLIC PANEL
Anion gap: 12 (ref 5–15)
BUN: 5 mg/dL — ABNORMAL LOW (ref 8–23)
CO2: 19 mmol/L — ABNORMAL LOW (ref 22–32)
Calcium: 8.2 mg/dL — ABNORMAL LOW (ref 8.9–10.3)
Chloride: 104 mmol/L (ref 98–111)
Creatinine, Ser: 0.7 mg/dL (ref 0.61–1.24)
GFR calc Af Amer: >60 mL/min (ref >60)
GFR calc non Af Amer: >60 mL/min (ref >60)
Glucose, Bld: 132 mg/dL — ABNORMAL HIGH (ref 70–99)
Potassium: 2.7 mmol/L — CL (ref 3.5–5.1)
Sodium: 135 mmol/L (ref 135–145)

## 2019-11-24 LAB — BPAM RBC
Blood Product Expiration Date: 202012172359
ISSUE DATE / TIME: 202011271319
Unit Type and Rh: 6200

## 2019-11-24 MED ORDER — POTASSIUM CHLORIDE 20 MEQ/15ML (10%) PO SOLN: 40.0000 meq | Freq: Two times a day (BID) | ORAL | Status: DC

## 2019-11-24 MED ORDER — POTASSIUM CHLORIDE 20 MEQ/15ML (10%) PO SOLN
40.0000 meq | ORAL | Status: AC
  Administered 2019-11-24 (×3): 40 meq via ORAL
  Filled 2019-11-24 (×3): qty 30

## 2019-11-24 MED ORDER — POTASSIUM CHLORIDE 20 MEQ PO PACK: 40.0000 meq | PACK | ORAL | Status: DC

## 2019-11-24 NOTE — Progress Notes (Signed)
PROGRESS NOTE    [name]  [id number] DOB: 24-Dec-1931 DOA: 11/20/2019 PCP: Monico Blitz, MD   Brief Narrative:  85 year old male with history of hypertension, hyperlipidemia, CVA with left-sided hemiparesis, hypothyroidism, CAD, seizure disorder, prostate cancer, history of DVT on Eliquis was brought to the emergency department to [facility] with the melena.  In the emergency department patient was hemodynamically stable had hemoglobin of 8.1 trended down from twelve 1 year ago.  Patient received blood transfusions, developed another episode of hematemesis with a drop of hemoglobin to 6.5.  Patient received Kcentra.  GI was consulted.  Patient was transferred to [facility] to be evaluated by GI.  As patient's [id number] came back to be positive patient is transferred to [facility] for continuity of the care and further evaluation by GI.  Assessment & Plan:   Principal Problem:   Acute upper GI bleeding Active Problems:   CORONARY ATHEROSCLEROSIS NATIVE CORONARY ARTERY   History of CVA (cerebrovascular accident)   Prostate cancer ([place])   History of deep vein thrombosis (DVT) of lower extremity   Seizure disorder (HCC)   Hematemesis without nausea   Melena   ##GI bleed -Patient developed GI bleed in the setting of aspirin, Eliquis -Patient received Kcentra to reverse Eliquis -Protonix drip -GI is consulted- underwent EGD and found to have small ulcer, possibly pill induced -Continue with IV fluids - hemoglobin trended down to 7.1, given 1 unit of packed RBC, IV iron -Hemoglobin improved to 9.2  ##Hypokalemia -Replacing by mouth -Follow-up with BMP  ##Acute blood loss anemia -Secondary to GI bleed -Keep the patient on IV iron -Transfuse for hemoglobin less than 8 -Hemoglobin trended down to 7.1, transfusing 1 unit of packed RBC. -Hemoglobin improved to 9.2  ##COVID-19 infection -Patient does not have any symptoms of hypoxia -Continue with the  contact, airborne/droplet precautions -Closely monitor inflammatory markers  ##History of CAD, prior stroke -Hold aspirin, statin for now  ##Seizure disorder -Continue with Keppra IV     DVT prophylaxis: SCDs Code Status: DNR Family Communication: Updated patient's son over the phone 11/24/2019 disposition Plan: 24 to 48 hours   Consultants:  Gastroenterology  Procedures:   EGD  Antimicrobials:  Subjective: Denies any abdominal pain  Objective: Vitals:   11/23/19 1400 11/23/19 1531 11/23/19 1745 11/24/19 0907  BP: 132/72 132/69 (!) 120/58 (!) 145/92  Pulse: 77 (!) 59 78 70  Resp: 20  20 16   Temp: (!) 97.5 F (36.4 C) 98.1 F (36.7 C) 98.3 F (36.8 C) 98.2 F (36.8 C)  TempSrc: Oral Oral Oral Oral  SpO2: 100% 99%  99%  Weight:      Height:        Intake/Output Summary (Last 24 hours) at 11/24/2019 1628 Last data filed at 11/24/2019 0456 Gross per 24 hour  Intake 4386.22 ml  Output 1000 ml  Net 3386.22 ml   Filed Weights   11/20/19 2023  Weight: 90.4 kg    Examination:  General exam: Appears calm and comfortable  Respiratory system: Clear to auscultation. Respiratory effort normal. Cardiovascular system: S1 & S2 heard, RRR. No JVD, murmurs, rubs, gallops or clicks. No pedal edema. Gastrointestinal system: Abdomen is nondistended, soft and nontender. No organomegaly or masses felt. Normal bowel sounds heard. Central nervous system: Alert and oriented. No focal neurological deficits. Extremities: Symmetric 5 x 5 power. Skin: No rashes, lesions or ulcers Psychiatry: Judgement and insight appear normal. Mood & affect appropriate.     Data Reviewed: I have  personally reviewed following labs and imaging studies  CBC: Recent Labs  Lab 11/20/19 2147 11/21/19 0540 11/21/19 1652 11/23/19 0424 11/23/19 1306 11/24/19 1050  WBC 11.9*  --  9.5 8.9  --  9.8  NEUTROABS  --   --   --  5.9  --   --   HGB 9.1* 8.1* 8.1* 7.1* 7.0* 9.5*  HCT 28.5* 25.9*  25.9* 22.2* 22.5* 28.9*  MCV 87.4  --  89.9 87.4  --  86.8  PLT 154  --  138* 138*  --  [id number]   Basic Metabolic Panel: Recent Labs  Lab 11/20/19 2147 11/21/19 0540 11/23/19 0424 11/24/19 1050  NA 137 140 135 135  K 3.9 4.1 3.1* 2.7*  CL 107 109 103 104  CO2 21* 21* 22 19*  GLUCOSE 118* 109* 112* 132*  BUN 56* 50* 7* 5*  CREATININE 0.86 0.86 0.70 0.70  CALCIUM 8.4* 8.4* 8.1* 8.2*  MG  --   --  1.9  --    GFR: Estimated Creatinine Clearance: 71.1 mL/min (by C-G formula based on SCr of 0.7 mg/dL). Liver Function Tests: Recent Labs  Lab 11/20/19 2147 11/23/19 0424  AST 20 29  ALT 32 34  ALKPHOS 83 67  BILITOT 0.5 0.5  PROT 6.1* 5.1*  ALBUMIN 3.1* 2.7*   No results for input(s): LIPASE, AMYLASE in the last 168 hours. No results for input(s): AMMONIA in the last 168 hours. Coagulation Profile: No results for input(s): INR, PROTIME in the last 168 hours. Cardiac Enzymes: No results for input(s): CKTOTAL, CKMB, CKMBINDEX, TROPONINI in the last 168 hours. BNP (last 3 results) No results for input(s): PROBNP in the last 8760 hours. HbA1C: No results for input(s): HGBA1C in the last 72 hours. CBG: No results for input(s): GLUCAP in the last 168 hours. Lipid Profile: No results for input(s): CHOL, HDL, LDLCALC, TRIG, CHOLHDL, LDLDIRECT in the last 72 hours. Thyroid Function Tests: No results for input(s): TSH, T4TOTAL, FREET4, T3FREE, THYROIDAB in the last 72 hours. Anemia Panel: Recent Labs    11/23/19 0424  FERRITIN 14*   Sepsis Labs: No results for input(s): PROCALCITON, LATICACIDVEN in the last 168 hours.  Recent Results (from the past 240 hour(s))  SARS Coronavirus 2 by RT PCR (hospital order, performed in [facility] hospital lab) Nasopharyngeal Nasopharyngeal Swab     Status: Abnormal   Collection Time: 11/21/19 12:42 PM   Specimen: Nasopharyngeal Swab  Result Value Ref Range Status   SARS Coronavirus 2 POSITIVE (A) NEGATIVE Final    Comment: RESULT CALLED  TO, READ BACK BY AND VERIFIED WITH: Cristopher Peru RN 14:10 11/21/19 ([email or web address]) (NOTE) SARS-CoV-2 target nucleic acids are DETECTED SARS-CoV-2 RNA is generally detectable in upper respiratory specimens  during the acute phase of infection.  Positive results are indicative  of the presence of the identified virus, but do not rule out bacterial infection or co-infection with other pathogens not detected by the test.  Clinical correlation with patient history and  other diagnostic information is necessary to determine patient infection status.  The expected result is negative. Fact Sheet for Patients:   StrictlyIdeas.no  Fact Sheet for Healthcare Providers:   BankingDealers.co.za   This test is not yet approved or cleared by the [place] FDA and  has been authorized for detection and/or diagnosis of SARS-CoV-2 by FDA under an Emergency Use Authorization (EUA).  This EUA will remain in effect (meaning this test ca n be used) for the duration of  the COVID-19  declaration under Section 564(b)(1) of the Act, 21 U.S.C. section 360-bbb-3(b)(1), unless the authorization is terminated or revoked sooner. Performed at [facility], [place] [address] [place], [place] [address]          Radiology Studies: No results found.      Scheduled Meds: . levETIRAcetam  500 mg Oral Q8H  . levothyroxine  125 mcg Oral Q0600  . multivitamin with minerals  1 tablet Oral Daily  . pantoprazole  40 mg Intravenous Q12H  . potassium chloride  40 mEq Oral Q3H  . sodium chloride flush  3 mL Intravenous Q12H  . sodium chloride flush  3 mL Intravenous Q12H  . vitamin C  500 mg Oral Daily  . zinc sulfate  220 mg Oral Daily   Continuous Infusions: . sodium chloride    . dextrose 5 % and 0.45% NaCl Stopped (11/23/19 1556)     LOS: 4 days    Time spent: 15 minutes   Annalyse Langlais, MD [facility] Pager [phone]  If 7PM-7AM, please  contact night-coverage [email or web address] Password [facility] 11/24/2019, 4:28 PM

## 2019-11-24 NOTE — Progress Notes (Signed)
CRITICAL VALUE ALERT  Critical Value:  Potassium 2.7  Date & Time Notied:  11/24/2019 @1139   Provider Notified: Dr Lunette Stands @ 1200  Orders Received/Actions taken: order were placed in epic for potassium supplement

## 2019-11-25 LAB — BASIC METABOLIC PANEL
Anion gap: 9 (ref 5–15)
BUN: <5 mg/dL — ABNORMAL LOW (ref 8–23)
CO2: 20 mmol/L — ABNORMAL LOW (ref 22–32)
Calcium: 8.3 mg/dL — ABNORMAL LOW (ref 8.9–10.3)
Chloride: 106 mmol/L (ref 98–111)
Creatinine, Ser: 0.66 mg/dL (ref 0.61–1.24)
GFR calc Af Amer: >60 mL/min (ref >60)
GFR calc non Af Amer: >60 mL/min (ref >60)
Glucose, Bld: 115 mg/dL — ABNORMAL HIGH (ref 70–99)
Potassium: 4 mmol/L (ref 3.5–5.1)
Sodium: 135 mmol/L (ref 135–145)

## 2019-11-25 LAB — CBC
HCT: 31.4 % — ABNORMAL LOW (ref 39.0–52.0)
Hemoglobin: 9.8 g/dL — ABNORMAL LOW (ref 13.0–17.0)
MCH: 28 pg (ref 26.0–34.0)
MCHC: 31.2 g/dL (ref 30.0–36.0)
MCV: 89.7 fL (ref 80.0–100.0)
Platelets: 168 10*3/uL (ref 150–400)
RBC: 3.5 MIL/uL — ABNORMAL LOW (ref 4.22–5.81)
RDW: 17.6 % — ABNORMAL HIGH (ref 11.5–15.5)
WBC: 9.4 10*3/uL (ref 4.0–10.5)
nRBC: 0 % (ref 0.0–0.2)

## 2019-11-25 LAB — MAGNESIUM: Magnesium: 1.6 mg/dL — ABNORMAL LOW (ref 1.7–2.4)

## 2019-11-25 LAB — SARS CORONAVIRUS 2 (TAT 6-24 HRS): SARS Coronavirus 2: NEGATIVE

## 2019-11-25 MED ORDER — MAGNESIUM SULFATE 2 GM/50ML IV SOLN
2.0000 g | Freq: Once | INTRAVENOUS | Status: AC
  Administered 2019-11-25: 2 g via INTRAVENOUS
  Filled 2019-11-25: qty 50

## 2019-11-25 NOTE — Progress Notes (Signed)
CSW acknowledges consult for SNF. PT/OT evaluations are currently pending.   CSW will continue to follow and assist with disposition planning based off therapy recommendations.   Domenic Schwab, MSW, [place] Worker [facility]  [phone]

## 2019-11-25 NOTE — Progress Notes (Signed)
PROGRESS NOTE    [name]  [id number] DOB: 10-12-1931 DOA: 11/20/2019 PCP: Monico Blitz, MD   Brief Narrative:  85 year old male with history of hypertension, hyperlipidemia, CVA with left-sided hemiparesis, hypothyroidism, CAD, seizure disorder, prostate cancer, history of DVT on Eliquis was brought to the emergency department to [facility] with the melena.  In the emergency department patient was hemodynamically stable had hemoglobin of 8.1 trended down from twelve 1 year ago.  Patient received blood transfusions, developed another episode of hematemesis with a drop of hemoglobin to 6.5.  Patient received Kcentra.  GI was consulted.  Patient was transferred to [facility] to be evaluated by GI.  As patient's [id number] came back to be positive patient is transferred to [facility] for continuity of the care and further evaluation by GI.  Underwent EGD, found to have small ulcer in the stomach.  Aspirin, Eliquis have been on hold.  Hemoglobin remained stable.  Patient received 5 units of packed RBC total.  Patient is also given IV iron.  Currently hemoglobin is stable at 9.8.  Plan to discharge patient to home/SNF based on availability of caregivers at home.  Assessment & Plan:   Principal Problem:   Acute upper GI bleeding Active Problems:   CORONARY ATHEROSCLEROSIS NATIVE CORONARY ARTERY   History of CVA (cerebrovascular accident)   Prostate cancer ([place])   History of deep vein thrombosis (DVT) of lower extremity   Seizure disorder (HCC)   Hematemesis without nausea   Melena   ##GI bleed -Patient developed GI bleed in the setting of aspirin, Eliquis -Patient received Kcentra to reverse Eliquis -Protonix drip -GI is consulted- underwent EGD and found to have small ulcer, possibly pill induced -Continue with IV fluids - hemoglobin trended down to 7.1, given 1 unit of packed RBC, IV iron -Hemoglobin improved to 9.2->9.8  ##Hypokalemia -Replacing by mouth  -Normalized  ##Acute blood loss anemia -Secondary to GI bleed -Keep the patient on IV iron -Transfuse for hemoglobin less than 8 -Hemoglobin trended down to 7.1, transfusing 1 unit of packed RBC. -Hemoglobin improved to 9.2->9.8  ##COVID-19 infection -Patient does not have any symptoms of hypoxia -Continue with the contact, airborne/droplet precautions -Closely monitor inflammatory markers  ##History of CAD, prior stroke -Hold aspirin, statin for now  ##Seizure disorder -Continue with Keppra IV  Severe debility -Patient is at baseline wheelchair-bound, taking care by 24/7 caregivers at home.  Due to patient's Covid positive status, unable to get caregivers at home.  Consulted social work for assistance.     DVT prophylaxis: SCDs Code Status: DNR Family Communication: Updated patient's son over the phone 11/25/2019 disposition Plan: Pending placement  Consultants:  Gastroenterology  Procedures:   EGD  Antimicrobials:  Subjective: Denies any abdominal pain, tolerating the diet well  Objective: Vitals:   11/24/19 2341 11/25/19 0637 11/25/19 0746 11/25/19 1524  BP: (!) 168/80  (!) 149/72 (!) 145/82  Pulse: 76  78 92  Resp: 16     Temp: 97.7 F (36.5 C)  99 F (37.2 C) 97.6 F (36.4 C)  TempSrc:   Oral Oral  SpO2: 100%  99% 99%  Weight:  90.4 kg    Height:        Intake/Output Summary (Last 24 hours) at 11/25/2019 1625 Last data filed at 11/25/2019 1527 Gross per 24 hour  Intake 1057.21 ml  Output 1700 ml  Net -642.79 ml   Filed Weights   11/20/19 2023 11/25/19 0637  Weight: 90.4 kg 90.4 kg  Examination:  General exam: Appears calm and comfortable  Respiratory system: Clear to auscultation. Respiratory effort normal. Cardiovascular system: S1 & S2 heard, RRR. No JVD, murmurs, rubs, gallops or clicks. No pedal edema. Gastrointestinal system: Abdomen is nondistended, soft and nontender. No organomegaly or masses felt. Normal bowel sounds heard.  Central nervous system: Alert and oriented. No focal neurological deficits. Extremities: Symmetric 5 x 5 power. Skin: No rashes, lesions or ulcers Psychiatry: Judgement and insight appear normal. Mood & affect appropriate.     Data Reviewed: I have personally reviewed following labs and imaging studies  CBC: Recent Labs  Lab 11/20/19 2147  11/21/19 1652 11/23/19 0424 11/23/19 1306 11/24/19 1050 11/25/19 0628  WBC 11.9*  --  9.5 8.9  --  9.8 9.4  NEUTROABS  --   --   --  5.9  --   --   --   HGB 9.1*   < > 8.1* 7.1* 7.0* 9.5* 9.8*  HCT 28.5*   < > 25.9* 22.2* 22.5* 28.9* 31.4*  MCV 87.4  --  89.9 87.4  --  86.8 89.7  PLT 154  --  138* 138*  --  150 168   < > = values in this interval not displayed.   Basic Metabolic Panel: Recent Labs  Lab 11/20/19 2147 11/21/19 0540 11/23/19 0424 11/24/19 1050 11/25/19 0628  NA 137 140 135 135 135  K 3.9 4.1 3.1* 2.7* 4.0  CL 107 109 103 104 106  CO2 21* 21* 22 19* 20*  GLUCOSE 118* 109* 112* 132* 115*  BUN 56* 50* 7* 5* <5*  CREATININE 0.86 0.86 0.70 0.70 0.66  CALCIUM 8.4* 8.4* 8.1* 8.2* 8.3*  MG  --   --  1.9  --  1.6*   GFR: Estimated Creatinine Clearance: 71.1 mL/min (by C-G formula based on SCr of 0.66 mg/dL). Liver Function Tests: Recent Labs  Lab 11/20/19 2147 11/23/19 0424  AST 20 29  ALT 32 34  ALKPHOS 83 67  BILITOT 0.5 0.5  PROT 6.1* 5.1*  ALBUMIN 3.1* 2.7*   No results for input(s): LIPASE, AMYLASE in the last 168 hours. No results for input(s): AMMONIA in the last 168 hours. Coagulation Profile: No results for input(s): INR, PROTIME in the last 168 hours. Cardiac Enzymes: No results for input(s): CKTOTAL, CKMB, CKMBINDEX, TROPONINI in the last 168 hours. BNP (last 3 results) No results for input(s): PROBNP in the last 8760 hours. HbA1C: No results for input(s): HGBA1C in the last 72 hours. CBG: No results for input(s): GLUCAP in the last 168 hours. Lipid Profile: No results for input(s): CHOL, HDL,  LDLCALC, TRIG, CHOLHDL, LDLDIRECT in the last 72 hours. Thyroid Function Tests: No results for input(s): TSH, T4TOTAL, FREET4, T3FREE, THYROIDAB in the last 72 hours. Anemia Panel: Recent Labs    11/23/19 0424  FERRITIN 14*   Sepsis Labs: No results for input(s): PROCALCITON, LATICACIDVEN in the last 168 hours.  Recent Results (from the past 240 hour(s))  SARS Coronavirus 2 by RT PCR (hospital order, performed in [facility] hospital lab) Nasopharyngeal Nasopharyngeal Swab     Status: Abnormal   Collection Time: 11/21/19 12:42 PM   Specimen: Nasopharyngeal Swab  Result Value Ref Range Status   SARS Coronavirus 2 POSITIVE (A) NEGATIVE Final    Comment: RESULT CALLED TO, READ BACK BY AND VERIFIED WITH: Cristopher Peru RN 14:10 11/21/19 ([email or web address]) (NOTE) SARS-CoV-2 target nucleic acids are DETECTED SARS-CoV-2 RNA is generally detectable in upper respiratory specimens  during the acute  phase of infection.  Positive results are indicative  of the presence of the identified virus, but do not rule out bacterial infection or co-infection with other pathogens not detected by the test.  Clinical correlation with patient history and  other diagnostic information is necessary to determine patient infection status.  The expected result is negative. Fact Sheet for Patients:   StrictlyIdeas.no  Fact Sheet for Healthcare Providers:   BankingDealers.co.za   This test is not yet approved or cleared by the [place] FDA and  has been authorized for detection and/or diagnosis of SARS-CoV-2 by FDA under an Emergency Use Authorization (EUA).  This EUA will remain in effect (meaning this test ca n be used) for the duration of  the COVID-19 declaration under Section 564(b)(1) of the Act, 21 U.S.C. section 360-bbb-3(b)(1), unless the authorization is terminated or revoked sooner. Performed at [facility], [place] [address] [place], [place] [address]           Radiology Studies: No results found.      Scheduled Meds: . levETIRAcetam  500 mg Oral Q8H  . levothyroxine  125 mcg Oral Q0600  . multivitamin with minerals  1 tablet Oral Daily  . pantoprazole  40 mg Intravenous Q12H  . sodium chloride flush  3 mL Intravenous Q12H  . sodium chloride flush  3 mL Intravenous Q12H  . vitamin C  500 mg Oral Daily  . zinc sulfate  220 mg Oral Daily   Continuous Infusions: . sodium chloride    . dextrose 5 % and 0.45% NaCl 80 mL/hr at 11/25/19 1314     LOS: 5 days    Time spent: 40 minutes   Terryl Niziolek, MD [facility] Pager [phone]  If 7PM-7AM, please contact night-coverage [email or web address] Password [facility] 11/25/2019, 4:25 PM

## 2019-11-25 NOTE — TOC Initial Note (Signed)
Transition of Care [facility]) - Initial/Assessment Note    Patient Details  Name: [name] MRN: [id number] Date of Birth: 1931-09-24  Transition of Care [facility]) CM/SW Contact:    Gelene Mink, [place] Phone Number: 11/25/2019, 4:47 PM  Clinical Narrative:                  Patient becoming medically ready. Family had questions regarding continuum of care. Patient is from home with private duty care givers, before testing positive for COVID. Patient has wheel chair and hoyer lift at home. He is totally dependent on others to help care for him.   CSW called and spoke with the patient's son. CSW stated that they did not assist with private duty nurses but CSW referred the family to a place for mom. If the family is not able to get 24/7 supervision at home, they would be agreeable to SNF.   CSW reached out to [facility] with [facility] and asked for her to reach out to the family.   CSW will continue to follow and assist with TOC needs.   Expected Discharge Plan: (Home Health or SNF) Barriers to Discharge: Insurance Authorization, Continued Medical Work up   Patient Goals and CMS Choice Patient states their goals for this hospitalization and ongoing recovery are:: Disposition to be determined CMS Medicare.gov Compare Post Acute Care list provided to:: Patient Represenative (must comment) Choice offered to / list presented to : Adult Children  Expected Discharge Plan and Services Expected Discharge Plan: (Home Health or SNF) In-house Referral: Clinical Social Work     Living arrangements for the past 2 months: [place]                                      Prior Living Arrangements/Services Living arrangements for the past 2 months: [place] with:: Self Patient language and need for interpreter reviewed:: No Do you feel safe going back to the place where you live?: Yes      Need for Family Participation in Patient Care: Yes (Comment) Care  giver support system in place?: Yes (comment) Current home services: Homehealth aide, Sitter, DME Criminal Activity/Legal Involvement Pertinent to Current Situation/Hospitalization: No - Comment as needed  Activities of Daily Living Home Assistive Devices/Equipment: [employer] fast streamer ADL Screening (condition at time of admission) Patient's cognitive ability adequate to safely complete daily activities?: Yes Is the patient deaf or have difficulty hearing?: Yes Does the patient have difficulty seeing, even when wearing glasses/contacts?: Yes Does the patient have difficulty concentrating, remembering, or making decisions?: No Patient able to express need for assistance with ADLs?: Yes Does the patient have difficulty dressing or bathing?: Yes Independently performs ADLs?: No Communication: Independent Is this a change from baseline?: Pre-admission baseline Dressing (OT): Dependent Is this a change from baseline?: Pre-admission baseline Grooming: Dependent Is this a change from baseline?: Pre-admission baseline Feeding: Dependent Is this a change from baseline?: Pre-admission baseline Bathing: Dependent Is this a change from baseline?: Pre-admission baseline Toileting: Dependent Is this a change from baseline?: Pre-admission baseline In/Out Bed: Dependent Is this a change from baseline?: Pre-admission baseline Walks in Home: Dependent Is this a change from baseline?: Pre-admission baseline Does the patient have difficulty walking or climbing stairs?: Yes Weakness of Legs: Both Weakness of Arms/Hands: Both  Permission Sought/Granted Permission sought to share information with : Case Manager Permission granted to share information  with : Yes, Verbal Permission Granted              Emotional Assessment Appearance:: Appears stated age     Orientation: : Oriented to Self, Oriented to Place, Oriented to  Time Alcohol / Substance Use: Not Applicable Psych Involvement: No  (comment)  Admission diagnosis:  GI BLEED Patient Active Problem List   Diagnosis Date Noted  . Hematemesis without nausea   . Melena   . Acute upper GI bleeding 11/20/2019  . History of deep vein thrombosis (DVT) of lower extremity 11/20/2019  . Seizure disorder ([place]) 11/20/2019  . Knee pain, left anterior 03/24/2013  . Acute venous embolism and thrombosis of deep vessels of proximal lower extremity ([place]) 03/12/2013  . Prostate cancer ([place])   . History of CVA (cerebrovascular accident) 02/22/2013  . Spinal stenosis in cervical region 02/22/2013  . Bilateral chronic knee pain 02/22/2013  . Carotid bruit 11/15/2012  . CORONARY ATHEROSCLEROSIS NATIVE CORONARY ARTERY 01/01/2011  . Mixed hyperlipidemia 11/26/2010  . ELEVATED BLOOD PRESSURE 11/26/2010   PCP:  Monico Blitz, MD Pharmacy:   [facility], [address] [place] [address] Phone: [phone] Fax: [phone]     Social Determinants of Health (SDOH) Interventions    Readmission Risk Interventions No flowsheet data found.

## 2019-11-25 NOTE — Progress Notes (Signed)
PT Cancellation Note  Patient Details Name: [name] MRN: [id number] DOB: 26-Oct-1931   Cancelled Treatment:    Reason Eval/Treat Not Completed: Other (comment). Pt with CVA >6 yrs ago and evidently w/c dependent since then. Nurse asked daughter about transfers at home and she reported pt had hoyer lift in the past but didn't like it and got rid of it. May have something like a Stedy. Attempted to call daughter to clarify prior status but no answer and left a message. Will try again tomorrow.    Shary Decamp [facility] 11/25/2019, 3:49 PM Rusty Villella Anderson Pager [phone] Office [phone]

## 2019-11-26 ENCOUNTER — Encounter (HOSPITAL_COMMUNITY): Payer: Self-pay | Admitting: Internal Medicine

## 2019-11-26 MED ORDER — PANTOPRAZOLE SODIUM 40 MG PO TBEC
40.0000 mg | DELAYED_RELEASE_TABLET | Freq: Two times a day (BID) | ORAL | Status: DC
  Administered 2019-11-26 – 2019-11-28 (×5): 40 mg via ORAL
  Filled 2019-11-26 (×6): qty 1

## 2019-11-26 NOTE — Evaluation (Signed)
Physical Therapy Evaluation Patient Details Name: [name] MRN: [id number] DOB: 07-26-1931 Today's Date: 11/26/2019   History of Present Illness  85 y.o. male with medical history significant for CVA with left-sided hemiparesis, hypothyroidism, coronary artery disease, seizure disorder, prostate cancer, and history of DVT on Eliquis, who presented to the [facility] ED with melena. Found to be COVID+ transferred to [facility] admitted 11/20/19 s/p diagnostic EGD 11/27 found to have gastropathy with small gastric ulcer.   Clinical Impression  Pt lives in single story home with 24/7 caregiver. Pt requires total A for transfer to w/c where he states he has limited ability to propel himself with his R UE/LE. Pt is able to feed himself, however requires assist for other ADLs and all iADLs. Pt currently limited in safe mobility by decreased strength L>R and decreased ROM. Pt requires total A for bed mobility. Unclear that pt caregiver will be able to resume care given pt's current COVID+ diagnosis. PT recommending SNF level rehab if 24 hour care not available. PT will continue to follow acutely.    Follow Up Recommendations SNF;Supervision/Assistance - 24 hour    Equipment Recommendations  None recommended by PT       Precautions / Restrictions Precautions Precautions: Fall Restrictions Weight Bearing Restrictions: No      Mobility  Bed Mobility Overal bed mobility: Needs Assistance Bed Mobility: Supine to Sit;Sit to Supine     Supine to sit: Total assist Sit to supine: Total assist   General bed mobility comments: total A for coming to EoB with use of handrail, total A for LE management and trunk alignment        Balance Overall balance assessment: Needs assistance Sitting-balance support: Feet supported;Single extremity supported Sitting balance-Leahy Scale: Poor Sitting balance - Comments: requires UE support and only able to maintain for 20 sec with 1x attempt to self  correct before requiring assist,  Postural control: Posterior lean;Right lateral lean                                   Pertinent Vitals/Pain Pain Assessment: No/denies pain    Home Living Family/patient expects to be discharged to:: Private residence Living Arrangements: Other (Comment)(24/7 caregiver) Available Help at Discharge: Personal care attendant;Available 24 hours/day Type of Home: House Home Access: Level entry     Home Layout: One level Home Equipment: Other (comment);Wheelchair - manual;Bedside commode Additional Comments: pt had manual hoyer lift but family unable to safely utilize so pt sold it    Prior Function Level of Independence: Needs assistance   Gait / Transfers Assistance Needed: total assist to W/c where he was able to perform some propulsion with R UE/LE  ADL's / Homemaking Assistance Needed: able to feed himself, dependent with all other ADLs and iADLs        Hand Dominance   Dominant Hand: Right    Extremity/Trunk Assessment   Upper Extremity Assessment Upper Extremity Assessment: LUE deficits/detail LUE Deficits / Details: L sided weakness from CVA, able to grasp and has some shoulder and elbow movement with assist    Lower Extremity Assessment Lower Extremity Assessment: RLE deficits/detail;LLE deficits/detail RLE Deficits / Details: ROM WFL, strength grossly 3+/ RLE Sensation: WNL RLE Coordination: decreased fine motor;decreased gross motor LLE Deficits / Details: L sided weakness from CVA, hip, knee and ankle movement however lacks full ROM even with assist LLE Sensation: WNL LLE Coordination: decreased fine motor;decreased gross motor  Cervical / Trunk Assessment Cervical / Trunk Assessment: Kyphotic  Communication   Communication: Expressive difficulties(slow response to questions)  Cognition Arousal/Alertness: Awake/alert Behavior During Therapy: WFL for tasks assessed/performed Overall Cognitive Status: Within  Functional Limits for tasks assessed                                        General Comments General comments (skin integrity, edema, etc.): SaO2 on RA 100%O2, VSS        Assessment/Plan    PT Assessment Patient needs continued PT services  PT Problem List Decreased strength;Decreased range of motion;Decreased activity tolerance;Decreased balance;Decreased mobility;Decreased coordination       PT Treatment Interventions DME instruction;Functional mobility training;Therapeutic activities;Therapeutic exercise;Balance training;Cognitive remediation;Wheelchair mobility training;Patient/family education    PT Goals (Current goals can be found in the Care Plan section)  Acute Rehab PT Goals Patient Stated Goal: see his cat [name] PT Goal Formulation: With patient Time For Goal Achievement: 12/10/19 Potential to Achieve Goals: Fair    Frequency Min 2X/week   Barriers to discharge Decreased caregiver support;Other (comment)(due to COVID+)         AM-PAC PT "6 Clicks" Mobility  Outcome Measure Help needed turning from your back to your side while in a flat bed without using bedrails?: Total Help needed moving from lying on your back to sitting on the side of a flat bed without using bedrails?: Total Help needed moving to and from a bed to a chair (including a wheelchair)?: Total Help needed standing up from a chair using your arms (e.g., wheelchair or bedside chair)?: Total Help needed to walk in hospital room?: Total Help needed climbing 3-5 steps with a railing? : Total 6 Click Score: 6    End of Session   Activity Tolerance: Patient tolerated treatment well Patient left: in bed;with call bell/phone within reach;with bed alarm set Nurse Communication: Mobility status;Need for lift equipment PT Visit Diagnosis: Unsteadiness on feet (R26.81);Other abnormalities of gait and mobility (R26.89);Muscle weakness (generalized) (M62.81);Hemiplegia and  hemiparesis;Difficulty in walking, not elsewhere classified (R26.2) Hemiplegia - Right/Left: Left Hemiplegia - dominant/non-dominant: Non-dominant Hemiplegia - caused by: Cerebral infarction    Time: [id number] PT Time Calculation (min) (ACUTE ONLY): 26 min   Charges:   PT Evaluation $PT Eval Moderate Complexity: 1 Mod PT Treatments $Therapeutic Activity: 8-22 mins        Undrea Archbold B. Migdalia Dk PT, DPT Acute Rehabilitation Services Pager [phone] Office [phone]   [place] 11/26/2019, 11:24 AM

## 2019-11-26 NOTE — Progress Notes (Signed)
Made phone call to son, Elta Guadeloupe with updates re: patient status and inquire plan for discharge. Son states would like for patient to return home with primary caregivers. However, patient's daughter, Freada Bergeron is the only caregiver of which coronavirus results have returned. She is negative. Son is concerned about Freada Bergeron being the only caregiver available for 24/7 care. All other caregivers and family members are awaiting results. Son plans to discuss with family next steps. Will await collaboration between physician, family and CSW.

## 2019-11-26 NOTE — Care Management (Addendum)
CM reached out to pts daughter per request of CSW and attending.  CM informed daughter that based on conversation with attending;  an additional COVID test is now being ordered for accuracy of pts current status (status is required for SNF placement) .  Daughter is in total agreement and will simultaneously work on a home discharge plan with 24 hour supervision if pt is negative.  Daughter informed CM that both her and her brother have now tested negative however neither of them can lift pt.  The caregivers that can lift the pt test have not yet resulted.   TOC will continue to follow

## 2019-11-26 NOTE — Progress Notes (Signed)
PROGRESS NOTE    [name]  [id number] DOB: 01/20/1931 DOA: 11/20/2019 PCP: Monico Blitz, MD   Brief Narrative:  85 year old male with history of hypertension, hyperlipidemia, CVA with left-sided hemiparesis, hypothyroidism, CAD, seizure disorder, prostate cancer, history of DVT on Eliquis was brought to the emergency department to [facility] with the melena.  In the emergency department patient was hemodynamically stable had hemoglobin of 8.1 trended down from twelve 1 year ago.  Patient received blood transfusions, developed another episode of hematemesis with a drop of hemoglobin to 6.5.  Patient received Kcentra.  GI was consulted.  Patient was transferred to [facility] to be evaluated by GI.  As patient's [id number] came back to be positive patient is transferred to [facility] for continuity of the care and further evaluation by GI.  Underwent EGD, found to have small ulcer in the stomach.  Aspirin, Eliquis have been on hold.  Hemoglobin remained stable.  Patient received 5 units of packed RBC total.  Patient is also given IV iron.  Currently hemoglobin is stable at 9.8.  Plan to discharge patient to home/SNF based on availability of caregivers at home.  Assessment & Plan:   Principal Problem:   Acute upper GI bleeding Active Problems:   CORONARY ATHEROSCLEROSIS NATIVE CORONARY ARTERY   History of CVA (cerebrovascular accident)   Prostate cancer ([place])   History of deep vein thrombosis (DVT) of lower extremity   Seizure disorder (HCC)   Hematemesis without nausea   Melena   ##GI bleed -Patient developed GI bleed in the setting of aspirin, Eliquis -Patient received Kcentra to reverse Eliquis -Protonix drip -GI is consulted- underwent EGD and found to have small ulcer, possibly pill induced -Continue with IV fluids - hemoglobin trended down to 7.1, given 1 unit of packed RBC, IV iron -Hemoglobin improved to 9.2->9.8  ##Hypokalemia -Replacing by mouth  -Normalized  ##Hypomagnesemia -Replaced by IV  ##Acute blood loss anemia -Secondary to GI bleed -Keep the patient on IV iron -Transfuse for hemoglobin less than 8 -Hemoglobin trended down to 7.1, transfusing 1 unit of packed RBC. -Hemoglobin improved to 9.2->9.8  ##COVID-19 infection -Patient does not have any symptoms of hypoxia -Continue with the contact, airborne/droplet precautions -Closely monitor inflammatory markers -Repeat COVID-19 test came back to be negative on 11/25/2019  ##History of CAD, prior stroke -Hold aspirin, statin for now  ##Seizure disorder -Continue with Keppra IV  Severe debility -Patient is at baseline wheelchair-bound, taking care by 24/7 caregivers at home.  Due to patient's Covid positive status, unable to get caregivers at home.  Consulted social work for assistance.     DVT prophylaxis: SCDs Code Status: DNR Family Communication: Updated patient's son over the phone 11/26/2019  disposition Plan: Pending discharge planning between family and the case management.  If family finds caregivers at home, plan to discharge patient home, if not available plan to send to SNF for 2 to 3 days.  Consultants:  Gastroenterology  Procedures:   EGD  Antimicrobials:  Subjective: Denies any abdominal pain, tolerating the diet well  Objective: Vitals:   11/25/19 0746 11/25/19 1524 11/26/19 0020 11/26/19 0742  BP: (!) 149/72 (!) 145/82 (!) 157/78 126/89  Pulse: 78 92 74 71  Resp:   18   Temp: 99 F (37.2 C) 97.6 F (36.4 C) 98.6 F (37 C) 98.2 F (36.8 C)  TempSrc: Oral Oral Oral Oral  SpO2: 99% 99% 100% 100%  Weight:      Height:  Intake/Output Summary (Last 24 hours) at 11/26/2019 1427 Last data filed at 11/26/2019 0915 Gross per 24 hour  Intake 1656.28 ml  Output 3100 ml  Net -1443.72 ml   Filed Weights   11/20/19 2023 11/25/19 0637  Weight: 90.4 kg 90.4 kg    Examination:  General exam: Appears calm and comfortable   Respiratory system: Clear to auscultation. Respiratory effort normal. Cardiovascular system: S1 & S2 heard, RRR. No JVD, murmurs, rubs, gallops or clicks. No pedal edema. Gastrointestinal system: Abdomen is nondistended, soft and nontender. No organomegaly or masses felt. Normal bowel sounds heard. Central nervous system: Alert and oriented. No focal neurological deficits. Extremities: Symmetric 5 x 5 power. Skin: No rashes, lesions or ulcers Psychiatry: Judgement and insight appear normal. Mood & affect appropriate.     Data Reviewed: I have personally reviewed following labs and imaging studies  CBC: Recent Labs  Lab 11/20/19 2147  11/21/19 1652 11/23/19 0424 11/23/19 1306 11/24/19 1050 11/25/19 0628  WBC 11.9*  --  9.5 8.9  --  9.8 9.4  NEUTROABS  --   --   --  5.9  --   --   --   HGB 9.1*   < > 8.1* 7.1* 7.0* 9.5* 9.8*  HCT 28.5*   < > 25.9* 22.2* 22.5* 28.9* 31.4*  MCV 87.4  --  89.9 87.4  --  86.8 89.7  PLT 154  --  138* 138*  --  150 168   < > = values in this interval not displayed.   Basic Metabolic Panel: Recent Labs  Lab 11/20/19 2147 11/21/19 0540 11/23/19 0424 11/24/19 1050 11/25/19 0628  NA 137 140 135 135 135  K 3.9 4.1 3.1* 2.7* 4.0  CL 107 109 103 104 106  CO2 21* 21* 22 19* 20*  GLUCOSE 118* 109* 112* 132* 115*  BUN 56* 50* 7* 5* <5*  CREATININE 0.86 0.86 0.70 0.70 0.66  CALCIUM 8.4* 8.4* 8.1* 8.2* 8.3*  MG  --   --  1.9  --  1.6*   GFR: Estimated Creatinine Clearance: 71.1 mL/min (by C-G formula based on SCr of 0.66 mg/dL). Liver Function Tests: Recent Labs  Lab 11/20/19 2147 11/23/19 0424  AST 20 29  ALT 32 34  ALKPHOS 83 67  BILITOT 0.5 0.5  PROT 6.1* 5.1*  ALBUMIN 3.1* 2.7*   No results for input(s): LIPASE, AMYLASE in the last 168 hours. No results for input(s): AMMONIA in the last 168 hours. Coagulation Profile: No results for input(s): INR, PROTIME in the last 168 hours. Cardiac Enzymes: No results for input(s): CKTOTAL,  CKMB, CKMBINDEX, TROPONINI in the last 168 hours. BNP (last 3 results) No results for input(s): PROBNP in the last 8760 hours. HbA1C: No results for input(s): HGBA1C in the last 72 hours. CBG: No results for input(s): GLUCAP in the last 168 hours. Lipid Profile: No results for input(s): CHOL, HDL, LDLCALC, TRIG, CHOLHDL, LDLDIRECT in the last 72 hours. Thyroid Function Tests: No results for input(s): TSH, T4TOTAL, FREET4, T3FREE, THYROIDAB in the last 72 hours. Anemia Panel: No results for input(s): VITAMINB12, FOLATE, FERRITIN, TIBC, IRON, RETICCTPCT in the last 72 hours. Sepsis Labs: No results for input(s): PROCALCITON, LATICACIDVEN in the last 168 hours.  Recent Results (from the past 240 hour(s))  SARS Coronavirus 2 by RT PCR (hospital order, performed in [facility] hospital lab) Nasopharyngeal Nasopharyngeal Swab     Status: Abnormal   Collection Time: 11/21/19 12:42 PM   Specimen: Nasopharyngeal Swab  Result  Value Ref Range Status   SARS Coronavirus 2 POSITIVE (A) NEGATIVE Final    Comment: RESULT CALLED TO, READ BACK BY AND VERIFIED WITH: Cristopher Peru RN 14:10 11/21/19 ([email or web address]) (NOTE) SARS-CoV-2 target nucleic acids are DETECTED SARS-CoV-2 RNA is generally detectable in upper respiratory specimens  during the acute phase of infection.  Positive results are indicative  of the presence of the identified virus, but do not rule out bacterial infection or co-infection with other pathogens not detected by the test.  Clinical correlation with patient history and  other diagnostic information is necessary to determine patient infection status.  The expected result is negative. Fact Sheet for Patients:   StrictlyIdeas.no  Fact Sheet for Healthcare Providers:   BankingDealers.co.za   This test is not yet approved or cleared by the [place] FDA and  has been authorized for detection and/or diagnosis of SARS-CoV-2 by FDA under an  Emergency Use Authorization (EUA).  This EUA will remain in effect (meaning this test ca n be used) for the duration of  the COVID-19 declaration under Section 564(b)(1) of the Act, 21 U.S.C. section 360-bbb-3(b)(1), unless the authorization is terminated or revoked sooner. Performed at [facility], [place] [address], [place], [place] [address]   SARS CORONAVIRUS 2 (TAT 6-24 HRS) Nasopharyngeal Nasopharyngeal Swab     Status: None   Collection Time: 11/25/19  5:09 PM   Specimen: Nasopharyngeal Swab  Result Value Ref Range Status   SARS Coronavirus 2 NEGATIVE NEGATIVE Final    Comment: (NOTE) SARS-CoV-2 target nucleic acids are NOT DETECTED. The SARS-CoV-2 RNA is generally detectable in upper and lower respiratory specimens during the acute phase of infection. Negative results do not preclude SARS-CoV-2 infection, do not rule out co-infections with other pathogens, and should not be used as the sole basis for treatment or other patient management decisions. Negative results must be combined with clinical observations, patient history, and epidemiological information. The expected result is Negative. Fact Sheet for Patients: SugarRoll.be Fact Sheet for Healthcare Providers: https://www.woods-mathews.com/ This test is not yet approved or cleared by the [place] FDA and  has been authorized for detection and/or diagnosis of SARS-CoV-2 by FDA under an Emergency Use Authorization (EUA). This EUA will remain  in effect (meaning this test can be used) for the duration of the COVID-19 declaration under Section 56 4(b)(1) of the Act, 21 U.S.C. section 360bbb-3(b)(1), unless the authorization is terminated or revoked sooner. Performed at [facility], [place] [address], [place], [place] [address]          Radiology Studies: No results found.      Scheduled Meds: . levETIRAcetam  500 mg Oral Q8H  . levothyroxine  125 mcg  Oral Q0600  . multivitamin with minerals  1 tablet Oral Daily  . pantoprazole  40 mg Intravenous Q12H  . sodium chloride flush  3 mL Intravenous Q12H  . sodium chloride flush  3 mL Intravenous Q12H  . vitamin C  500 mg Oral Daily  . zinc sulfate  220 mg Oral Daily   Continuous Infusions: . sodium chloride    . dextrose 5 % and 0.45% NaCl 80 mL/hr at 11/26/19 0915     LOS: 6 days    Time spent: 4 minutes   Jaymond Waage, MD [facility] Pager [phone]  If 7PM-7AM, please contact night-coverage [email or web address] Password [email or web address] 11/26/2019, 2:27 PM

## 2019-11-26 NOTE — Progress Notes (Signed)
Received report this am. Patient included in bedside report. No questions/concerns as this time. Will continue to monitor.

## 2019-11-27 LAB — SARS CORONAVIRUS 2 (TAT 6-24 HRS): SARS Coronavirus 2: NEGATIVE

## 2019-11-27 NOTE — Plan of Care (Signed)

## 2019-11-27 NOTE — Progress Notes (Addendum)
PROGRESS NOTE    [name]  [id number] DOB: May 08, 1931 DOA: 11/20/2019 PCP: Monico Blitz, MD      Brief Narrative:  [name] is a 85 y.o. M with History of CVA with left-sided hemiparesis, hypothyroidism, DVT on Eliquis, prostate cancer, seizure disorder, and CAD who presented to the hospital in [place] with melena.  In the ER, hemoglobin 8 g/dL, down from 12-year ago.  He was being admitted there for transfusion when he developed hematemesis, and hemoglobin dropped to 6.5.  Was given [facility] for reversal and transferred to [facility] for GI consultation.      Assessment & Plan:  Acute upper GI bleed EGD on 11/26 showed friable gastric mucosa, small ulcer.  -Hold Eliquis -Continue PPI -Consult to Gi, appreciate cares   Acute blood loss anemia Transfused 2 units at OSH and 1 unit here.  S/p IV iron. Hemoglobin up to 9.8 today.  Positive SARS-CoV-2 PCR Discussed with lab, this appears to have been run on the cephiad machine, that has a single patient's sample processed at once, so I think the likelihood of a false positive from cross contamination is low.  More likely he had an asymptomatic infection prior to admission, and his positive test on admission was residual PCR positivity. -Repeat SARS-CoV-2 test today --> if positive, CM will work with family to arrange SNF, otherwise, will go home tomorrow with HHPT and aides -Reasonable to continue zinc and vitamin C for now  History cerebrovascular disease with left-sided hemiparesis  Hypothyroidism -Continue levothyroxine  DVT on Eliquis  History of prostate cancer  History of seizures -Continue Keppra  Coronary disease secondary prevention -Hold aspirin, Zetia, Toprol  Prostate cancer -Hold Casodex   Depression -Hold Escitalopram       MDM and disposition: The below labs and imaging reports were reviewed and summarized above.  Medication management as above.  The patient was admitted with melena and  hematemesis.  He underwent EGD that showed a small gastric ulcer.  He has had no further bleeding, and is stable medically for discharge.  However because of his positive Covid test we do not have at present a safe discharge plan, and the patient needs 24-hour supervision total cares.  We will repeat Covid testing today.  If he has 2 negative tests more than 24 hours apart, he is safe for discharge home to be with his family and aids.  Otherwise will need to find Covid safe SNF discharge arrangements.       DVT prophylaxis: SCDs Code Status: DO NOT RESUSCITATE Family Communication: Called the daughter, no answer    Consultants:   GI  Procedures:   11/26 EGD-congestive gastropathy, small gastric ulcer, no visible vessel  Antimicrobials:   None   Subjective: Feels fine, tired, no further melena, hematemesis, hematochezia.  Objective: Vitals:   11/26/19 0742 11/26/19 1610 11/26/19 2017 11/27/19 0849  BP: 126/89 129/77 124/69 (!) 159/71  Pulse: 71 98 96 75  Resp:   (!) 21 18  Temp: 98.2 F (36.8 C) 98.4 F (36.9 C) 98.5 F (36.9 C) 97.7 F (36.5 C)  TempSrc: Oral Oral Oral Oral  SpO2: 100% 100% 99% 97%  Weight:      Height:        Intake/Output Summary (Last 24 hours) at 11/27/2019 1456 Last data filed at 11/27/2019 1200 Gross per 24 hour  Intake 1731.21 ml  Output 3300 ml  Net -1568.79 ml   Filed Weights   11/20/19 2023 11/25/19 0637  Weight: 90.4  kg 90.4 kg    Examination: General appearance: Elderly adult male, alert and in no obvious distress.   HEENT: Anicteric, conjunctiva pale, lids and lashes normal. No nasal deformity, discharge, epistaxis.  Lips moist, dentition poor, no oral lesions, oropharynx moist, hearing diminished.   Skin: Warm and dry.  Pale, no jaundice.  No suspicious rashes or lesions. Cardiac: RRR, nl [id number], no systolic murmurs noted with disposable stethoscope.  Capillary refill is brisk.  JVPnot visible.  No LE edema.  Radial pulses 2+  and symmetric. Respiratory: Normal respiratory rate and rhythm.  CTAB without rales or wheezes. Abdomen: Abdomen soft.  No tenderness palpation or guarding. No ascites, distension, hepatosplenomegaly.   MSK: No deformities or effusions. Neuro: Awake and alert.  EOMI, moves all extremities with severe generalized weakness, left side weakness more than right, chronic contractures in the left hand. Speech fluent.    Psych: Sensorium intact and responding to questions, attention normal. Affect blunted.  Judgment and insight appear normal.    Data Reviewed: I have personally reviewed following labs and imaging studies:  CBC: Recent Labs  Lab 11/20/19 2147  11/21/19 1652 11/23/19 0424 11/23/19 1306 11/24/19 1050 11/25/19 0628  WBC 11.9*  --  9.5 8.9  --  9.8 9.4  NEUTROABS  --   --   --  5.9  --   --   --   HGB 9.1*   < > 8.1* 7.1* 7.0* 9.5* 9.8*  HCT 28.5*   < > 25.9* 22.2* 22.5* 28.9* 31.4*  MCV 87.4  --  89.9 87.4  --  86.8 89.7  PLT 154  --  138* 138*  --  150 168   < > = values in this interval not displayed.   Basic Metabolic Panel: Recent Labs  Lab 11/20/19 2147 11/21/19 0540 11/23/19 0424 11/24/19 1050 11/25/19 0628  NA 137 140 135 135 135  K 3.9 4.1 3.1* 2.7* 4.0  CL 107 109 103 104 106  CO2 21* 21* 22 19* 20*  GLUCOSE 118* 109* 112* 132* 115*  BUN 56* 50* 7* 5* <5*  CREATININE 0.86 0.86 0.70 0.70 0.66  CALCIUM 8.4* 8.4* 8.1* 8.2* 8.3*  MG  --   --  1.9  --  1.6*   GFR: Estimated Creatinine Clearance: 71.1 mL/min (by C-G formula based on SCr of 0.66 mg/dL). Liver Function Tests: Recent Labs  Lab 11/20/19 2147 11/23/19 0424  AST 20 29  ALT 32 34  ALKPHOS 83 67  BILITOT 0.5 0.5  PROT 6.1* 5.1*  ALBUMIN 3.1* 2.7*   No results for input(s): LIPASE, AMYLASE in the last 168 hours. No results for input(s): AMMONIA in the last 168 hours. Coagulation Profile: No results for input(s): INR, PROTIME in the last 168 hours. Cardiac Enzymes: No results for  input(s): CKTOTAL, CKMB, CKMBINDEX, TROPONINI in the last 168 hours. BNP (last 3 results) No results for input(s): PROBNP in the last 8760 hours. HbA1C: No results for input(s): HGBA1C in the last 72 hours. CBG: No results for input(s): GLUCAP in the last 168 hours. Lipid Profile: No results for input(s): CHOL, HDL, LDLCALC, TRIG, CHOLHDL, LDLDIRECT in the last 72 hours. Thyroid Function Tests: No results for input(s): TSH, T4TOTAL, FREET4, T3FREE, THYROIDAB in the last 72 hours. Anemia Panel: No results for input(s): VITAMINB12, FOLATE, FERRITIN, TIBC, IRON, RETICCTPCT in the last 72 hours. Urine analysis:    Component Value Date/Time   COLORURINE YELLOW 03/05/2013 1002   APPEARANCEUR HAZY (A) 03/05/2013 1002  LABSPEC 1.014 03/05/2013 1002   PHURINE 7.5 03/05/2013 1002   GLUCOSEU NEGATIVE 03/05/2013 1002   [place] 03/05/2013 [place] 03/05/2013 1002   [place] 03/05/2013 1002   PROTEINUR NEGATIVE 03/05/2013 1002   UROBILINOGEN 0.2 03/05/2013 1002   NITRITE POSITIVE (A) 03/05/2013 1002   LEUKOCYTESUR TRACE (A) 03/05/2013 1002   Sepsis Labs: @LABRCNTIP (procalcitonin:4,lacticacidven:4)  ) Recent Results (from the past 240 hour(s))  SARS Coronavirus 2 by RT PCR (hospital order, performed in [place] hospital lab) Nasopharyngeal Nasopharyngeal Swab     Status: Abnormal   Collection Time: 11/21/19 12:42 PM   Specimen: Nasopharyngeal Swab  Result Value Ref Range Status   SARS Coronavirus 2 POSITIVE (A) NEGATIVE Final    Comment: RESULT CALLED TO, READ BACK BY AND VERIFIED WITH: Cristopher Peru RN 14:10 11/21/19 ([email or web address]) (NOTE) SARS-CoV-2 target nucleic acids are DETECTED SARS-CoV-2 RNA is generally detectable in upper respiratory specimens  during the acute phase of infection.  Positive results are indicative  of the presence of the identified virus, but do not rule out bacterial infection or co-infection with other pathogens not detected by  the test.  Clinical correlation with patient history and  other diagnostic information is necessary to determine patient infection status.  The expected result is negative. Fact Sheet for Patients:   StrictlyIdeas.no  Fact Sheet for Healthcare Providers:   BankingDealers.co.za   This test is not yet approved or cleared by the [place] FDA and  has been authorized for detection and/or diagnosis of SARS-CoV-2 by FDA under an Emergency Use Authorization (EUA).  This EUA will remain in effect (meaning this test ca n be used) for the duration of  the COVID-19 declaration under Section 564(b)(1) of the Act, 21 U.S.C. section 360-bbb-3(b)(1), unless the authorization is terminated or revoked sooner. Performed at [facility], [place] [address], [place], [place] [address]   SARS CORONAVIRUS 2 (TAT 6-24 HRS) Nasopharyngeal Nasopharyngeal Swab     Status: None   Collection Time: 11/25/19  5:09 PM   Specimen: Nasopharyngeal Swab  Result Value Ref Range Status   SARS Coronavirus 2 NEGATIVE NEGATIVE Final    Comment: (NOTE) SARS-CoV-2 target nucleic acids are NOT DETECTED. The SARS-CoV-2 RNA is generally detectable in upper and lower respiratory specimens during the acute phase of infection. Negative results do not preclude SARS-CoV-2 infection, do not rule out co-infections with other pathogens, and should not be used as the sole basis for treatment or other patient management decisions. Negative results must be combined with clinical observations, patient history, and epidemiological information. The expected result is Negative. Fact Sheet for Patients: SugarRoll.be Fact Sheet for Healthcare Providers: https://www.woods-mathews.com/ This test is not yet approved or cleared by the [place] FDA and  has been authorized for detection and/or diagnosis of SARS-CoV-2 by FDA under an Emergency Use  Authorization (EUA). This EUA will remain  in effect (meaning this test can be used) for the duration of the COVID-19 declaration under Section 56 4(b)(1) of the Act, 21 U.S.C. section 360bbb-3(b)(1), unless the authorization is terminated or revoked sooner. Performed at [facility], [place] [address] [place], [place] [address]          Radiology Studies: No results found.      Scheduled Meds: . levETIRAcetam  500 mg Oral Q8H  . levothyroxine  125 mcg Oral Q0600  . multivitamin with minerals  1 tablet Oral Daily  . pantoprazole  40 mg Oral BID  .  sodium chloride flush  3 mL Intravenous Q12H  . sodium chloride flush  3 mL Intravenous Q12H  . vitamin C  500 mg Oral Daily  . zinc sulfate  220 mg Oral Daily   Continuous Infusions: . sodium chloride Stopped (11/27/19 1331)  . dextrose 5 % and 0.45% NaCl Stopped (11/27/19 1334)     LOS: 7 days    Time spent: 25 minutes    Edwin Dada, MD [facility] 11/27/2019, 2:56 PM     Please page though [place] or Epic secure chat:  For [employer], Adult nurse

## 2019-11-27 NOTE — Care Management (Addendum)
CM spoke with new attending today regarding requested COVID test from yesterday not being ordered.  Dr Loleta Books informed CM that he will discuss results/recommendations with I&D and follow up with CM.  CM contacted by daughter requesting yesterdays results and CM explained that test has not been ordered , attending would like to collaborate with I&D before deciding on ordering a new test.  Daughter relayed frustration with the discrepancies with the prior resulted COVID tests and again request a new test for confirmation.  Per daughter - both resources that will need to lift pt in the home has now resulted in a negative COVID test.   Update:  COVID test will be ordered today, daughter notified.  TOC notified ; pt will remain in house until test ordered today has resulted

## 2019-11-28 LAB — CBC
HCT: 31.6 % — ABNORMAL LOW (ref 39.0–52.0)
Hemoglobin: 10 g/dL — ABNORMAL LOW (ref 13.0–17.0)
MCH: 27.9 pg (ref 26.0–34.0)
MCHC: 31.6 g/dL (ref 30.0–36.0)
MCV: 88.3 fL (ref 80.0–100.0)
Platelets: 202 10*3/uL (ref 150–400)
RBC: 3.58 MIL/uL — ABNORMAL LOW (ref 4.22–5.81)
RDW: 17.1 % — ABNORMAL HIGH (ref 11.5–15.5)
WBC: 8.3 10*3/uL (ref 4.0–10.5)
nRBC: 0 % (ref 0.0–0.2)

## 2019-11-28 LAB — COMPREHENSIVE METABOLIC PANEL
ALT: 16 U/L (ref 0–44)
AST: 15 U/L (ref 15–41)
Albumin: 2.6 g/dL — ABNORMAL LOW (ref 3.5–5.0)
Alkaline Phosphatase: 76 U/L (ref 38–126)
Anion gap: 9 (ref 5–15)
BUN: 6 mg/dL — ABNORMAL LOW (ref 8–23)
CO2: 24 mmol/L (ref 22–32)
Calcium: 8.3 mg/dL — ABNORMAL LOW (ref 8.9–10.3)
Chloride: 102 mmol/L (ref 98–111)
Creatinine, Ser: 0.82 mg/dL (ref 0.61–1.24)
GFR calc Af Amer: >60 mL/min (ref >60)
GFR calc non Af Amer: >60 mL/min (ref >60)
Glucose, Bld: 96 mg/dL (ref 70–99)
Potassium: 4 mmol/L (ref 3.5–5.1)
Sodium: 135 mmol/L (ref 135–145)
Total Bilirubin: 0.3 mg/dL (ref 0.3–1.2)
Total Protein: 5.5 g/dL — ABNORMAL LOW (ref 6.5–8.1)

## 2019-11-28 MED ORDER — MAGNESIUM SULFATE 2 GM/50ML IV SOLN
2.0000 g | Freq: Once | INTRAVENOUS | Status: AC
  Administered 2019-11-28: 2 g via INTRAVENOUS
  Filled 2019-11-28: qty 50

## 2019-11-28 MED ORDER — MAGNESIUM 30 MG PO TABS: 30.0000 mg | ORAL_TABLET | Freq: Every day | ORAL | 0 refills | Status: AC

## 2019-11-28 MED ORDER — PANTOPRAZOLE SODIUM 40 MG PO TBEC: 40.0000 mg | DELAYED_RELEASE_TABLET | Freq: Two times a day (BID) | ORAL | 0 refills | Status: AC

## 2019-11-28 MED ORDER — ZINC SULFATE 220 (50 ZN) MG PO CAPS: 220.0000 mg | ORAL_CAPSULE | Freq: Every day | ORAL | 0 refills | Status: AC

## 2019-11-28 MED ORDER — ELIQUIS 5 MG PO TABS: 5.0000 mg | ORAL_TABLET | Freq: Two times a day (BID) | ORAL | 0 refills | Status: AC

## 2019-11-28 NOTE — TOC Transition Note (Addendum)
Transition of Care [facility]) - CM/SW Discharge Note   Patient Details  Name: [name] MRN: [id number] Date of Birth: Jun 21, 1931  Transition [facility]) CM/SW Contact:  Maryclare Labrador, RN Phone Number: 11/28/2019, 11:53 AM   Clinical Narrative:   Pt to discharge home today in care of family.  CM requested assistance with direct communication from bedside nurse as pt is HOH.  Per daughter ; pt request hospital bed - ordered.  CM offered choice - [facility] chosen - referral accepted and confirmed that equipment could be delivered to home today.  CM confirmed with daughter that home address for pt will be [place] [id number].  Pt already has a lift, 3:1 and sit & stand in the home - daughter declined needing additional equipment with the exception of bed.  CM offered home health choice per medicare.gov HH - no preference given.   Declined HH referrals [facility] Interim   Accepted referral [facility]  Pts daughter/son  is in agreement with [facility] - agency given home address and will contact pts daughter to set up services.  CM printed transport pkt to unit  - unit secretary to place on shadow chart.  CM requested completed DNR to accompany transport pkt.    [facility]AR called for 6 pm per request of daughter.  CM unable to get pt to communicate via phone however bedside nurse confirms that pt is aware that he is going home today and denied concerns with returning to the home setting,  [facility] to deliver hospital bed at 4pm today.    Final next level of care: [place] Barriers to Discharge: Insurance Authorization, Continued Medical Work up   Patient Goals and CMS Choice Patient states their goals for this hospitalization and ongoing recovery are:: Disposition to be determined CMS Medicare.gov Compare Post Acute Care list provided to:: Patient Choice offered to / list presented to : Patient, Adult Children  Discharge Placement                 Patient to be transferred to facility by: (Pt request Ambulance transport home) Name of family member notified: Pts daughter [name] Patient and family notified of of transfer: 11/28/19  Discharge Plan and Services In-house Referral: Clinical Social Work              DME Arranged: Hospital bed DME Agency: [facility] Date DME Agency Contacted: 11/28/19 Time DME Agency Contacted: [id number] Representative spoke with at DME Agency: [place] ([place]) Interventions     Readmission Risk Interventions No flowsheet data found.

## 2019-11-28 NOTE — Care Management (Signed)
    Durable Medical Equipment  (From admission, onward)         Start     Ordered   11/28/19 1134  For home use only DME Hospital bed  Once    Question Answer Comment  Length of Need 6 Months   The above medical condition requires: Patient requires the ability to reposition frequently   Head must be elevated greater than: 45 degrees   Bed type Semi-electric      11/28/19 1133         History of CVA with left-sided hemiparesis

## 2019-11-28 NOTE — Plan of Care (Signed)
  Problem: Education: Goal: Knowledge of General Education information will improve Description: Including pain rating scale, medication(s)/side effects and non-pharmacologic comfort measures Outcome: Completed/Met   Problem: Health Behavior/Discharge Planning: Goal: Ability to manage health-related needs will improve Outcome: Completed/Met   Problem: Clinical Measurements: Goal: Ability to maintain clinical measurements within normal limits will improve Outcome: Completed/Met Goal: Will remain free from infection Outcome: Completed/Met Goal: Diagnostic test results will improve Outcome: Completed/Met Goal: Respiratory complications will improve Outcome: Completed/Met Goal: Cardiovascular complication will be avoided Outcome: Completed/Met   Problem: Activity: Goal: Risk for activity intolerance will decrease Outcome: Completed/Met   Problem: Nutrition: Goal: Adequate nutrition will be maintained Outcome: Completed/Met   Problem: Coping: Goal: Level of anxiety will decrease Outcome: Completed/Met   Problem: Elimination: Goal: Will not experience complications related to bowel motility Outcome: Completed/Met Goal: Will not experience complications related to urinary retention Outcome: Completed/Met   Problem: Pain Managment: Goal: General experience of comfort will improve Outcome: Completed/Met   Problem: Safety: Goal: Ability to remain free from injury will improve Outcome: Completed/Met   Problem: Skin Integrity: Goal: Risk for impaired skin integrity will decrease Outcome: Completed/Met   Problem: Education: Goal: Ability to identify signs and symptoms of gastrointestinal bleeding will improve Outcome: Completed/Met   Problem: Bowel/Gastric: Goal: Will show no signs and symptoms of gastrointestinal bleeding Outcome: Completed/Met   Problem: Fluid Volume: Goal: Will show no signs and symptoms of excessive bleeding Outcome: Completed/Met   Problem:  Clinical Measurements: Goal: Complications related to the disease process, condition or treatment will be avoided or minimized Outcome: Completed/Met   Problem: Acute Rehab PT Goals(only PT should resolve) Goal: Pt Will Go Supine/Side To Sit Outcome: Completed/Met Goal: Pt Will Go Sit To Supine/Side Outcome: Completed/Met Goal: Patient Will Perform Sitting Balance Outcome: Completed/Met  Patient discharged to home with home health care with daughter.  Discharged to home via [facility]

## 2019-11-28 NOTE — Discharge Instructions (Signed)
Gastrointestinal Bleeding °Gastrointestinal (GI) bleeding is bleeding somewhere along the path that food travels through the body (digestive tract). This path is anywhere between the mouth and the opening of the butt (anus). You may have blood in your poop (stool) or have black poop. If you throw up (vomit), there may be blood in it. °This condition can be mild, serious, or even life-threatening. If you have a lot of bleeding, you may need to stay in the hospital. °What are the causes? °This condition may be caused by: °· Irritation and swelling of the esophagus (esophagitis). The esophagus is part of the body that moves food from your mouth to your stomach. °· Swollen veins in the butt (hemorrhoids). °· Areas of painful tearing in the opening of the butt (anal fissures). These are often caused by passing hard poop. °· Pouches that form on the colon over time (diverticulosis). °· Irritation and swelling (diverticulitis) in areas where pouches have formed on the colon. °· Growths (polyps) or cancer. Colon cancer often starts out as growths that are not cancer. °· Irritation of the stomach lining (gastritis). °· Sores (ulcers) in the stomach. °What increases the risk? °You are more likely to develop this condition if you: °· Have a certain type of infection in your stomach (Helicobacter pylori infection). °· Take certain medicines. °· Smoke. °· Drink alcohol. °What are the signs or symptoms? °Common symptoms of this condition include: °· Throwing up (vomiting) material that has bright red blood in it. It may look like coffee grounds. °· Changes in your poop. The poop may: °? Have red blood in it. °? Be black, look like tar, and smell stronger than normal. °? Be red. °· Pain or cramping in the belly (abdomen). °How is this treated? °Treatment for this condition depends on the cause of the bleeding. For example: °· Sometimes, the bleeding can be stopped during a procedure that is done to find the problem (endoscopy or  colonoscopy). °· Medicines can be used to: °? Help control irritation, swelling, or infection. °? Reduce acid in your stomach. °· Certain problems can be treated with: °? Creams. °? Medicines that are put in the butt (suppositories). °? Warm baths. °· Surgery is sometimes needed. °· If you lose a lot of blood, you may need a blood transfusion. °If bleeding is mild, you may be allowed to go home. If there is a lot of bleeding, you will need to stay in the hospital. °Follow these instructions at home: ° °· Take over-the-counter and prescription medicines only as told by your doctor. °· Eat foods that have a lot of fiber in them. These foods include beans, whole grains, and fresh fruits and vegetables. You can also try eating 1-3 prunes each day. °· Drink enough fluid to keep your pee (urine) pale yellow. °· Keep all follow-up visits as told by your doctor. This is important. °Contact a doctor if: °· Your symptoms do not get better. °Get help right away if: °· Your bleeding does not stop. °· You feel dizzy or you pass out (faint). °· You feel weak. °· You have very bad cramps in your back or belly. °· You pass large clumps of blood (clots) in your poop. °· Your symptoms are getting worse. °· You have chest pain or fast heartbeats. °Summary °· GI bleeding is bleeding somewhere along the path that food travels through the body (digestive tract). °· This bleeding can be caused by many things. Treatment depends on the cause of the bleeding. °·   Take medicines only as told by your doctor. °· Keep all follow-up visits as told by your doctor. This is important. °This information is not intended to replace advice given to you by your health care provider. Make sure you discuss any questions you have with your health care provider. °Document Released: 09/21/2008 Document Revised: 07/26/2018 Document Reviewed: 07/26/2018 °Elsevier Patient Education © 2020 [employer] ° °

## 2019-11-28 NOTE — Discharge Summary (Signed)
Physician Discharge Summary  [name] [id number] DOB: 1931/05/07 DOA: 11/20/2019  PCP: Monico Blitz, MD  Admit date: 11/20/2019 Discharge date: 11/28/2019  Admitted From: Home  Disposition:  Home   Recommendations for Outpatient Follow-up:  1. Follow up with PCP in 1-2 weeks 2. Please obtain BMP/CBC in one week 3. Needs follow up blood count on resumption of eliquis.  4. Patient was advised to resume eliquis on [place]: yes.   Discharge Condition: stable.  CODE STATUS: DNR Diet recommendation: Heart Healthy  Brief/Interim Summary: [name] is a 85 y.o. M with History of CVA with left-sided hemiparesis, hypothyroidism, DVT on Eliquis, prostate cancer, seizure disorder, and CAD who presented to the hospital in [place] with melena.  In the ER, hemoglobin 8 g/dL, down from 83-year ago.  He was being admitted there for transfusion when he developed hematemesis, and hemoglobin dropped to 6.5.  Was given [facility] for reversal and transferred to [facility] for GI consultation.  Acute upper GI bleed EGD on 11/26 showed friable gastric mucosa, small ulcer.  -Hold Eliquis -Continue PPI -Consult to Gi, appreciate cares -hb stable. Stable for discharge.  - per GI patient to resume eliquis five days. Patient to resume  eliquis 12-05.  Acute blood loss anemia Transfused 2 units at OSH and 1 unit here.  S/p IV iron. Hemoglobin up to 9.8 today.  Positive SARS-CoV-2 PCR Discussed with lab, this appears to have been run on the cephiad machine, that has a single patient's sample processed at once, so I think the likelihood of a false positive from cross contamination is low.  More likely he had an asymptomatic infection prior to admission, and his positive test on admission was residual PCR positivity. -Repeat SARS-CoV-2 test today --> if positive, CM will work with family to arrange SNF, otherwise, will go home tomorrow with HHPT and aides -Reasonable to continue zinc and  vitamin C for now  History cerebrovascular disease with left-sided hemiparesis  Hypothyroidism -Continue levothyroxine  DVT on Eliquis  History of prostate cancer  History of seizures -Continue Keppra  Coronary disease secondary prevention -Hold aspirin,  Resume Zetia, Toprol  Prostate cancer -Hold Casodex   Depression -resume  Escitalopram        Discharge Diagnoses:  Principal Problem:   Acute upper GI bleeding Active Problems:   CORONARY ATHEROSCLEROSIS NATIVE CORONARY ARTERY   History of CVA (cerebrovascular accident)   Prostate cancer ([place])   History of deep vein thrombosis (DVT) of lower extremity   Seizure disorder ([place])   Hematemesis without nausea   Melena    Discharge Instructions  Discharge Instructions    Diet - low sodium heart healthy   Complete by: As directed    Increase activity slowly   Complete by: As directed      Allergies as of 11/28/2019      Reactions   Iodinated Diagnostic Agents Other (See Comments)   Difficulty breathing, "tighten up"   Statins Other (See Comments)   "Weakness in arms, no strength" zetia      Medication List    STOP taking these medications   Ecotrin 325 MG EC tablet Generic drug: aspirin   omeprazole 20 MG capsule Commonly known as: PRILOSEC     TAKE these medications   bicalutamide 50 MG tablet Commonly known as: CASODEX Take 1 tablet (50 mg total) by mouth daily.   calcium carbonate 1250 (500 Ca) MG tablet Commonly known as: OS-CAL - dosed in mg of elemental calcium  Take 1 tablet (500 mg of elemental calcium total) by mouth daily. 500mg  elemental calcium   cholecalciferol 25 MCG (1000 UT) tablet Commonly known as: VITAMIN D Take 1 tablet (1,000 Units total) by mouth daily.   Eliquis 5 MG Tabs tablet Generic drug: apixaban Take 1 tablet (5 mg total) by mouth 2 (two) times daily. Start taking on: December 01, 2019 What changed: These instructions start on December 01, 2019. If you  are unsure what to do until then, ask your doctor or other care provider.   escitalopram 20 MG tablet Commonly known as: LEXAPRO Take 20 mg by mouth at bedtime.   ezetimibe 10 MG tablet Commonly known as: ZETIA Take 10 mg by mouth every morning.   levETIRAcetam 500 MG tablet Commonly known as: KEPPRA Take 1 tablet (500 mg total) by mouth 2 (two) times daily. What changed: when to take this   levothyroxine 125 MCG tablet Commonly known as: SYNTHROID Take 125 mcg by mouth daily.   magnesium 30 MG tablet Take 1 tablet (30 mg total) by mouth daily.   metoprolol succinate 25 MG 24 hr tablet Commonly known as: TOPROL-XL Take 25 mg by mouth every morning.   multivitamin with minerals Tabs tablet Take 1 tablet by mouth daily.   niacin 500 MG CR tablet Commonly known as: Niaspan Take 1 tablet (500 mg total) by mouth at bedtime.   nitroGLYCERIN 0.4 MG SL tablet Commonly known as: NITROSTAT Place 1 tablet (0.4 mg total) under the tongue every 5 (five) minutes as needed for chest pain.   pantoprazole 40 MG tablet Commonly known as: PROTONIX Take 1 tablet (40 mg total) by mouth 2 (two) times daily.   pramipexole 1 MG tablet Commonly known as: MIRAPEX Take 1 mg by mouth daily.   albuterol (2.5 MG/3ML) 0.083% nebulizer solution Commonly known as: PROVENTIL Take 2.5 mg by nebulization every 6 (six) hours as needed.   ProAir HFA 108 (90 Base) MCG/ACT inhaler Generic drug: albuterol Inhale 1 puff into the lungs every 6 (six) hours as needed for wheezing or shortness of breath.   senna-docusate 8.6-50 MG tablet Commonly known as: Senokot-S Take 1 tablet by mouth 2 (two) times daily. What changed: when to take this   tamsulosin 0.4 MG Caps capsule Commonly known as: FLOMAX Take 1 capsule (0.4 mg total) by mouth daily. What changed: how much to take   traMADol 50 MG tablet Commonly known as: ULTRAM Take 50 mg by mouth every 8 (eight) hours as needed for moderate pain or  severe pain.   vitamin C 500 MG tablet Commonly known as: ASCORBIC ACID Take 1 tablet (500 mg total) by mouth daily.   Zinc 50 MG Tabs Take 1 tablet (50 mg total) by mouth daily.   zinc sulfate 220 (50 Zn) MG capsule Take 1 capsule (220 mg total) by mouth daily. Start taking on: November 29, 2019       Allergies  Allergen Reactions  . Iodinated Diagnostic Agents Other (See Comments)    Difficulty breathing, "tighten up"  . Statins Other (See Comments)    "Weakness in arms, no strength" zetia    Consultations:  GI    Procedures/Studies: No results found.    Subjective: He is feeling better. No significant black stool.   Discharge Exam: Vitals:   11/28/19 0344 11/28/19 0709  BP: (!) 148/73 (!) 143/72  Pulse: 75 78  Resp: 18   Temp: 98.4 F (36.9 C) 98 F (36.7 C)  SpO2: 100%  98%     General: Pt is alert, awake, not in acute distress Cardiovascular: RRR, S1/S2 +, no rubs, no gallops Respiratory: CTA bilaterally, no wheezing, no rhonchi Abdominal: Soft, NT, ND, bowel sounds + Extremities: no edema, no cyanosis    The results of significant diagnostics from this hospitalization (including imaging, microbiology, ancillary and laboratory) are listed below for reference.     Microbiology: Recent Results (from the past 240 hour(s))  SARS Coronavirus 2 by RT PCR (hospital order, performed in [facility] hospital lab) Nasopharyngeal Nasopharyngeal Swab     Status: Abnormal   Collection Time: 11/21/19 12:42 PM   Specimen: Nasopharyngeal Swab  Result Value Ref Range Status   SARS Coronavirus 2 POSITIVE (A) NEGATIVE Final    Comment: RESULT CALLED TO, READ BACK BY AND VERIFIED WITH: Cristopher Peru RN 14:10 11/21/19 ([email or web address]) (NOTE) SARS-CoV-2 target nucleic acids are DETECTED SARS-CoV-2 RNA is generally detectable in upper respiratory specimens  during the acute phase of infection.  Positive results are indicative  of the presence of the identified virus, but do  not rule out bacterial infection or co-infection with other pathogens not detected by the test.  Clinical correlation with patient history and  other diagnostic information is necessary to determine patient infection status.  The expected result is negative. Fact Sheet for Patients:   StrictlyIdeas.no  Fact Sheet for Healthcare Providers:   BankingDealers.co.za   This test is not yet approved or cleared by the [place] FDA and  has been authorized for detection and/or diagnosis of SARS-CoV-2 by FDA under an Emergency Use Authorization (EUA).  This EUA will remain in effect (meaning this test ca n be used) for the duration of  the COVID-19 declaration under Section 564(b)(1) of the Act, 21 U.S.C. section 360-bbb-3(b)(1), unless the authorization is terminated or revoked sooner. Performed at [facility], [place] [address], [place], [place] [address]   SARS CORONAVIRUS 2 (TAT 6-24 HRS) Nasopharyngeal Nasopharyngeal Swab     Status: None   Collection Time: 11/25/19  5:09 PM   Specimen: Nasopharyngeal Swab  Result Value Ref Range Status   SARS Coronavirus 2 NEGATIVE NEGATIVE Final    Comment: (NOTE) SARS-CoV-2 target nucleic acids are NOT DETECTED. The SARS-CoV-2 RNA is generally detectable in upper and lower respiratory specimens during the acute phase of infection. Negative results do not preclude SARS-CoV-2 infection, do not rule out co-infections with other pathogens, and should not be used as the sole basis for treatment or other patient management decisions. Negative results must be combined with clinical observations, patient history, and epidemiological information. The expected result is Negative. Fact Sheet for Patients: SugarRoll.be Fact Sheet for Healthcare Providers: https://www.woods-mathews.com/ This test is not yet approved or cleared by the [place] FDA and  has been  authorized for detection and/or diagnosis of SARS-CoV-2 by FDA under an Emergency Use Authorization (EUA). This EUA will remain  in effect (meaning this test can be used) for the duration of the COVID-19 declaration under Section 56 4(b)(1) of the Act, 21 U.S.C. section 360bbb-3(b)(1), unless the authorization is terminated or revoked sooner. Performed at [facility], [place] [address] [place], [place] [address]   SARS CORONAVIRUS 2 (TAT 6-24 HRS) Nasopharyngeal Nasopharyngeal Swab     Status: None   Collection Time: 11/27/19 10:22 AM   Specimen: Nasopharyngeal Swab  Result Value Ref Range Status   SARS Coronavirus 2 NEGATIVE NEGATIVE Final    Comment: (NOTE) SARS-CoV-2 target nucleic acids are NOT DETECTED.  The SARS-CoV-2 RNA is generally detectable in upper and lower respiratory specimens during the acute phase of infection. Negative results do not preclude SARS-CoV-2 infection, do not rule out co-infections with other pathogens, and should not be used as the sole basis for treatment or other patient management decisions. Negative results must be combined with clinical observations, patient history, and epidemiological information. The expected result is Negative. Fact Sheet for Patients: SugarRoll.be Fact Sheet for Healthcare Providers: https://www.woods-mathews.com/ This test is not yet approved or cleared by the [place] FDA and  has been authorized for detection and/or diagnosis of SARS-CoV-2 by FDA under an Emergency Use Authorization (EUA). This EUA will remain  in effect (meaning this test can be used) for the duration of the COVID-19 declaration under Section 56 4(b)(1) of the Act, 21 U.S.C. section 360bbb-3(b)(1), unless the authorization is terminated or revoked sooner. Performed at [facility], [place] [address] [place], [place] [address]      Labs: BNP (last 3 results) No results for input(s): BNP in the  last 8760 hours. Basic Metabolic Panel: Recent Labs  Lab 11/23/19 0424 11/24/19 1050 11/25/19 0628 11/28/19 0408  NA 135 135 135 135  K 3.1* 2.7* 4.0 4.0  CL 103 104 106 102  CO2 22 19* 20* 24  GLUCOSE 112* 132* 115* 96  BUN 7* 5* <5* 6*  CREATININE 0.70 0.70 0.66 0.82  CALCIUM 8.1* 8.2* 8.3* 8.3*  MG 1.9  --  1.6*  --    Liver Function Tests: Recent Labs  Lab 11/23/19 0424 11/28/19 0408  AST 29 15  ALT 34 16  ALKPHOS 67 76  BILITOT 0.5 0.3  PROT 5.1* 5.5*  ALBUMIN 2.7* 2.6*   No results for input(s): LIPASE, AMYLASE in the last 168 hours. No results for input(s): AMMONIA in the last 168 hours. CBC: Recent Labs  Lab 11/21/19 1652 11/23/19 0424 11/23/19 1306 11/24/19 1050 11/25/19 0628 11/28/19 0408  WBC 9.5 8.9  --  9.8 9.4 8.3  NEUTROABS  --  5.9  --   --   --   --   HGB 8.1* 7.1* 7.0* 9.5* 9.8* 10.0*  HCT 25.9* 22.2* 22.5* 28.9* 31.4* 31.6*  MCV 89.9 87.4  --  86.8 89.7 88.3  PLT 138* 138*  --  150 168 202   Cardiac Enzymes: No results for input(s): CKTOTAL, CKMB, CKMBINDEX, TROPONINI in the last 168 hours. BNP: Invalid input(s): POCBNP CBG: No results for input(s): GLUCAP in the last 168 hours. D-Dimer No results for input(s): DDIMER in the last 72 hours. Hgb A1c No results for input(s): HGBA1C in the last 72 hours. Lipid Profile No results for input(s): CHOL, HDL, LDLCALC, TRIG, CHOLHDL, LDLDIRECT in the last 72 hours. Thyroid function studies No results for input(s): TSH, T4TOTAL, T3FREE, THYROIDAB in the last 72 hours.  Invalid input(s): FREET3 Anemia work up No results for input(s): VITAMINB12, FOLATE, FERRITIN, TIBC, IRON, RETICCTPCT in the last 72 hours. Urinalysis    Component Value Date/Time   COLORURINE YELLOW 03/05/2013 1002   APPEARANCEUR HAZY (A) 03/05/2013 1002   LABSPEC 1.014 03/05/2013 1002   PHURINE 7.5 03/05/2013 1002   GLUCOSEU NEGATIVE 03/05/2013 1002   HGBUR NEGATIVE 03/05/2013 1002   [place] 03/05/2013  1002   KETONESUR NEGATIVE 03/05/2013 1002   PROTEINUR NEGATIVE 03/05/2013 1002   UROBILINOGEN 0.2 03/05/2013 1002   NITRITE POSITIVE (A) 03/05/2013 1002   LEUKOCYTESUR TRACE (A) 03/05/2013 1002   Sepsis Labs Invalid input(s): PROCALCITONIN,  WBC,  [place] [facility] Recent Results (from the past 240 hour(s))  SARS Coronavirus 2 by RT PCR (hospital order, performed in [facility] hospital lab) Nasopharyngeal Nasopharyngeal Swab     Status: Abnormal   Collection Time: 11/21/19 12:42 PM   Specimen: Nasopharyngeal Swab  Result Value Ref Range Status   SARS Coronavirus 2 POSITIVE (A) NEGATIVE Final    Comment: RESULT CALLED TO, READ BACK BY AND VERIFIED WITH: Cristopher Peru RN 14:10 11/21/19 ([email or web address]) (NOTE) SARS-CoV-2 target nucleic acids are DETECTED SARS-CoV-2 RNA is generally detectable in upper respiratory specimens  during the acute phase of infection.  Positive results are indicative  of the presence of the identified virus, but do not rule out bacterial infection or co-infection with other pathogens not detected by the test.  Clinical correlation with patient history and  other diagnostic information is necessary to determine patient infection status.  The expected result is negative. Fact Sheet for Patients:   StrictlyIdeas.no  Fact Sheet for Healthcare Providers:   BankingDealers.co.za   This test is not yet approved or cleared by the [place] FDA and  has been authorized for detection and/or diagnosis of SARS-CoV-2 by FDA under an Emergency Use Authorization (EUA).  This EUA will remain in effect (meaning this test ca n be used) for the duration of  the COVID-19 declaration under Section 564(b)(1) of the Act, 21 U.S.C. section 360-bbb-3(b)(1), unless the authorization is terminated or revoked sooner. Performed at [facility], [place] [address] [place], [place] [address]   SARS CORONAVIRUS 2 (TAT 6-24 HRS)  Nasopharyngeal Nasopharyngeal Swab     Status: None   Collection Time: 11/25/19  5:09 PM   Specimen: Nasopharyngeal Swab  Result Value Ref Range Status   SARS Coronavirus 2 NEGATIVE NEGATIVE Final    Comment: (NOTE) SARS-CoV-2 target nucleic acids are NOT DETECTED. The SARS-CoV-2 RNA is generally detectable in upper and lower respiratory specimens during the acute phase of infection. Negative results do not preclude SARS-CoV-2 infection, do not rule out co-infections with other pathogens, and should not be used as the sole basis for treatment or other patient management decisions. Negative results must be combined with clinical observations, patient history, and epidemiological information. The expected result is Negative. Fact Sheet for Patients: SugarRoll.be Fact Sheet for Healthcare Providers: https://www.woods-mathews.com/ This test is not yet approved or cleared by the [place] FDA and  has been authorized for detection and/or diagnosis of SARS-CoV-2 by FDA under an Emergency Use Authorization (EUA). This EUA will remain  in effect (meaning this test can be used) for the duration of the COVID-19 declaration under Section 56 4(b)(1) of the Act, 21 U.S.C. section 360bbb-3(b)(1), unless the authorization is terminated or revoked sooner. Performed at [facility], [place] [address], [place], [place] [address]   SARS CORONAVIRUS 2 (TAT 6-24 HRS) Nasopharyngeal Nasopharyngeal Swab     Status: None   Collection Time: 11/27/19 10:22 AM   Specimen: Nasopharyngeal Swab  Result Value Ref Range Status   SARS Coronavirus 2 NEGATIVE NEGATIVE Final    Comment: (NOTE) SARS-CoV-2 target nucleic acids are NOT DETECTED. The SARS-CoV-2 RNA is generally detectable in upper and lower respiratory specimens during the acute phase of infection. Negative results do not preclude SARS-CoV-2 infection, do not rule out co-infections with other pathogens,  and should not be used as the sole basis for treatment or other patient management decisions. Negative results must be combined with clinical observations, patient history, and epidemiological information. The expected result is  Negative. Fact Sheet for Patients: SugarRoll.be Fact Sheet for Healthcare Providers: https://www.woods-mathews.com/ This test is not yet approved or cleared by the [place] FDA and  has been authorized for detection and/or diagnosis of SARS-CoV-2 by FDA under an Emergency Use Authorization (EUA). This EUA will remain  in effect (meaning this test can be used) for the duration of the COVID-19 declaration under Section 56 4(b)(1) of the Act, 21 U.S.C. section 360bbb-3(b)(1), unless the authorization is terminated or revoked sooner. Performed at [facility], [place] [address], [place], [place] [address]      Time coordinating discharge: 40 minutes  SIGNED:   Elmarie Shiley, MD  [facility]

## 2020-01-28 DEATH — deceased
